# Patient Record
Sex: Male | Born: 1980 | Race: White | Hispanic: No | Marital: Single | Smoking: Never smoker
Health system: Southern US, Community
[De-identification: ages and names within clinical notes are randomized; demographics above are authoritative.]

## PROBLEM LIST (undated history)

## (undated) VITALS — BP 127/88 | HR 111 | Temp 98.2°F | Resp 18 | Ht 72.5 in | Wt 175.0 lb

## (undated) DIAGNOSIS — R634 Abnormal weight loss: Secondary | ICD-10-CM

## (undated) DIAGNOSIS — F329 Major depressive disorder, single episode, unspecified: Secondary | ICD-10-CM

## (undated) DIAGNOSIS — K589 Irritable bowel syndrome without diarrhea: Secondary | ICD-10-CM

## (undated) DIAGNOSIS — G4700 Insomnia, unspecified: Secondary | ICD-10-CM

## (undated) DIAGNOSIS — F419 Anxiety disorder, unspecified: Secondary | ICD-10-CM

## (undated) DIAGNOSIS — F319 Bipolar disorder, unspecified: Secondary | ICD-10-CM

## (undated) DIAGNOSIS — G9340 Encephalopathy, unspecified: Secondary | ICD-10-CM

## (undated) DIAGNOSIS — F909 Attention-deficit hyperactivity disorder, unspecified type: Secondary | ICD-10-CM

## (undated) DIAGNOSIS — F32A Depression, unspecified: Secondary | ICD-10-CM

## (undated) HISTORY — DX: Depression, unspecified: F32.A

## (undated) HISTORY — DX: Major depressive disorder, single episode, unspecified: F32.9

## (undated) HISTORY — PX: ANAL FISSURECTOMY: SUR608

## (undated) HISTORY — DX: Abnormal weight loss: R63.4

## (undated) HISTORY — DX: Bipolar disorder, unspecified: F31.9

## (undated) HISTORY — DX: Insomnia, unspecified: G47.00

## (undated) HISTORY — DX: Encephalopathy, unspecified: G93.40

## (undated) HISTORY — PX: COLONOSCOPY W/ BIOPSIES: SHX1374

## (undated) HISTORY — PX: COLON SURGERY: SHX602

## (undated) HISTORY — DX: Anxiety disorder, unspecified: F41.9

---

## 2001-04-26 ENCOUNTER — Encounter (INDEPENDENT_AMBULATORY_CARE_PROVIDER_SITE_OTHER): Payer: Self-pay | Admitting: Specialist

## 2001-04-26 ENCOUNTER — Ambulatory Visit (HOSPITAL_COMMUNITY): Admission: RE | Admit: 2001-04-26 | Discharge: 2001-04-26 | Payer: Self-pay | Admitting: Gastroenterology

## 2001-04-26 DIAGNOSIS — K5190 Ulcerative colitis, unspecified, without complications: Secondary | ICD-10-CM

## 2001-10-24 ENCOUNTER — Ambulatory Visit (HOSPITAL_COMMUNITY): Admission: RE | Admit: 2001-10-24 | Discharge: 2001-10-24 | Payer: Self-pay | Admitting: Internal Medicine

## 2001-10-25 ENCOUNTER — Encounter: Payer: Self-pay | Admitting: Internal Medicine

## 2004-07-08 ENCOUNTER — Ambulatory Visit: Payer: Self-pay | Admitting: Gastroenterology

## 2005-03-16 ENCOUNTER — Ambulatory Visit: Payer: Self-pay | Admitting: Gastroenterology

## 2006-01-28 ENCOUNTER — Ambulatory Visit: Payer: Self-pay | Admitting: Gastroenterology

## 2006-09-14 ENCOUNTER — Ambulatory Visit: Payer: Self-pay | Admitting: Gastroenterology

## 2006-09-14 LAB — CONVERTED CEMR LAB
ALT: 23 units/L (ref 0–40)
AST: 25 units/L (ref 0–37)
Albumin: 4.2 g/dL (ref 3.5–5.2)
Alkaline Phosphatase: 44 units/L (ref 39–117)
Basophils Absolute: 0 10*3/uL (ref 0.0–0.1)
Basophils Relative: 0.8 % (ref 0.0–1.0)
Bilirubin, Direct: 0.1 mg/dL (ref 0.0–0.3)
Eosinophils Absolute: 0.1 10*3/uL (ref 0.0–0.6)
Eosinophils Relative: 2.4 % (ref 0.0–5.0)
HCT: 41.7 % (ref 39.0–52.0)
Hemoglobin: 14.5 g/dL (ref 13.0–17.0)
Lymphocytes Relative: 31.1 % (ref 12.0–46.0)
MCHC: 34.7 g/dL (ref 30.0–36.0)
MCV: 96.2 fL (ref 78.0–100.0)
Monocytes Absolute: 0.3 10*3/uL (ref 0.2–0.7)
Monocytes Relative: 7.5 % (ref 3.0–11.0)
Neutro Abs: 2 10*3/uL (ref 1.4–7.7)
Neutrophils Relative %: 58.2 % (ref 43.0–77.0)
Platelets: 299 10*3/uL (ref 150–400)
RBC: 4.34 M/uL (ref 4.22–5.81)
RDW: 12.5 % (ref 11.5–14.6)
Total Bilirubin: 0.7 mg/dL (ref 0.3–1.2)
Total Protein: 7.3 g/dL (ref 6.0–8.3)
WBC: 3.5 10*3/uL — ABNORMAL LOW (ref 4.5–10.5)

## 2006-09-28 ENCOUNTER — Ambulatory Visit: Payer: Self-pay | Admitting: Internal Medicine

## 2006-09-28 LAB — CONVERTED CEMR LAB: TSH: 0.97 microintl units/mL (ref 0.35–5.50)

## 2006-12-27 ENCOUNTER — Ambulatory Visit: Payer: Self-pay | Admitting: Gastroenterology

## 2007-01-02 ENCOUNTER — Ambulatory Visit: Payer: Self-pay | Admitting: Gastroenterology

## 2007-02-10 ENCOUNTER — Ambulatory Visit: Payer: Self-pay | Admitting: Gastroenterology

## 2007-02-20 ENCOUNTER — Ambulatory Visit: Payer: Self-pay | Admitting: Gastroenterology

## 2007-02-20 ENCOUNTER — Encounter: Payer: Self-pay | Admitting: Gastroenterology

## 2007-02-27 ENCOUNTER — Ambulatory Visit: Payer: Self-pay | Admitting: Internal Medicine

## 2007-03-02 ENCOUNTER — Inpatient Hospital Stay (HOSPITAL_COMMUNITY): Admission: AD | Admit: 2007-03-02 | Discharge: 2007-03-06 | Payer: Self-pay | Admitting: Internal Medicine

## 2007-03-02 ENCOUNTER — Ambulatory Visit: Payer: Self-pay | Admitting: Internal Medicine

## 2007-03-04 ENCOUNTER — Ambulatory Visit: Payer: Self-pay | Admitting: Internal Medicine

## 2007-03-10 ENCOUNTER — Ambulatory Visit: Payer: Self-pay | Admitting: Internal Medicine

## 2007-03-10 ENCOUNTER — Ambulatory Visit: Payer: Self-pay | Admitting: Gastroenterology

## 2007-03-14 ENCOUNTER — Ambulatory Visit: Payer: Self-pay | Admitting: Gastroenterology

## 2007-04-25 ENCOUNTER — Ambulatory Visit: Payer: Self-pay | Admitting: Gastroenterology

## 2007-04-25 LAB — CONVERTED CEMR LAB
Cholesterol: 227 mg/dL (ref 0–200)
HDL: 43.1 mg/dL (ref >39.0)
Total CHOL/HDL Ratio: 5.3
VLDL: 9 mg/dL (ref 0–40)

## 2007-05-23 ENCOUNTER — Ambulatory Visit: Payer: Self-pay | Admitting: Internal Medicine

## 2007-11-27 DIAGNOSIS — F329 Major depressive disorder, single episode, unspecified: Secondary | ICD-10-CM

## 2007-11-27 DIAGNOSIS — F411 Generalized anxiety disorder: Secondary | ICD-10-CM

## 2007-12-15 ENCOUNTER — Telehealth: Payer: Self-pay | Admitting: Gastroenterology

## 2007-12-20 ENCOUNTER — Ambulatory Visit: Payer: Self-pay | Admitting: Gastroenterology

## 2007-12-20 LAB — CONVERTED CEMR LAB
Basophils Absolute: 0 10*3/uL (ref 0.0–0.1)
Basophils Relative: 0.7 % (ref 0.0–1.0)
Eosinophils Absolute: 0.1 10*3/uL (ref 0.0–0.7)
Eosinophils Relative: 2.3 % (ref 0.0–5.0)
HCT: 43.5 % (ref 39.0–52.0)
MCHC: 33.8 g/dL (ref 30.0–36.0)
MCV: 98.2 fL (ref 78.0–100.0)
Monocytes Absolute: 0.3 10*3/uL (ref 0.1–1.0)
Neutrophils Relative %: 46.4 % (ref 43.0–77.0)
Platelets: 267 10*3/uL (ref 150–400)
RBC: 4.43 M/uL (ref 4.22–5.81)
WBC: 4 10*3/uL — ABNORMAL LOW (ref 4.5–10.5)

## 2007-12-22 ENCOUNTER — Telehealth: Payer: Self-pay | Admitting: Gastroenterology

## 2007-12-28 ENCOUNTER — Ambulatory Visit: Payer: Self-pay | Admitting: Internal Medicine

## 2007-12-28 ENCOUNTER — Encounter: Payer: Self-pay | Admitting: Gastroenterology

## 2008-02-12 ENCOUNTER — Telehealth: Payer: Self-pay | Admitting: Gastroenterology

## 2008-03-05 ENCOUNTER — Ambulatory Visit: Payer: Self-pay | Admitting: Gastroenterology

## 2008-03-05 LAB — CONVERTED CEMR LAB
Hgb A1c MFr Bld: 5.5 % (ref 4.6–6.0)
Total CHOL/HDL Ratio: 4.8

## 2008-03-18 ENCOUNTER — Telehealth: Payer: Self-pay | Admitting: Gastroenterology

## 2008-05-13 ENCOUNTER — Telehealth: Payer: Self-pay | Admitting: Gastroenterology

## 2008-06-11 ENCOUNTER — Telehealth: Payer: Self-pay | Admitting: Gastroenterology

## 2008-06-14 ENCOUNTER — Ambulatory Visit: Payer: Self-pay | Admitting: Internal Medicine

## 2008-06-14 DIAGNOSIS — E7800 Pure hypercholesterolemia, unspecified: Secondary | ICD-10-CM | POA: Insufficient documentation

## 2008-06-14 LAB — CONVERTED CEMR LAB
ALT: 15 units/L (ref 0–53)
AST: 17 units/L (ref 0–37)
Albumin: 4.9 g/dL (ref 3.5–5.2)
BUN: 12 mg/dL (ref 6–23)
Basophils Absolute: 0 10*3/uL (ref 0.0–0.1)
Basophils Relative: 0.3 % (ref 0.0–3.0)
CO2: 32 meq/L (ref 19–32)
Calcium: 9.2 mg/dL (ref 8.4–10.5)
Chlamydia, Swab/Urine, PCR: NEGATIVE
Chloride: 104 meq/L (ref 96–112)
Cholesterol: 224 mg/dL (ref 0–200)
Creatinine, Ser: 0.9 mg/dL (ref 0.4–1.5)
Direct LDL: 148.8 mg/dL
Eosinophils Absolute: 0 10*3/uL (ref 0.0–0.7)
GFR calc non Af Amer: 107 mL/min
HCT: 40.3 % (ref 39.0–52.0)
HDL goal, serum: 40 mg/dL
Hemoglobin, Urine: NEGATIVE
Hemoglobin: 14.1 g/dL (ref 13.0–17.0)
LDL Goal: 160 mg/dL
Leukocytes, UA: NEGATIVE
MCHC: 35.1 g/dL (ref 30.0–36.0)
MCV: 100.5 fL — ABNORMAL HIGH (ref 78.0–100.0)
Monocytes Absolute: 0.2 10*3/uL (ref 0.1–1.0)
Neutro Abs: 1.5 10*3/uL (ref 1.4–7.7)
RBC: 4.01 M/uL — ABNORMAL LOW (ref 4.22–5.81)
RDW: 12.9 % (ref 11.5–14.6)
pH: 7.5 (ref 5.0–8.0)

## 2008-06-16 ENCOUNTER — Encounter: Payer: Self-pay | Admitting: Internal Medicine

## 2008-06-17 ENCOUNTER — Encounter: Payer: Self-pay | Admitting: Internal Medicine

## 2008-07-18 ENCOUNTER — Ambulatory Visit: Payer: Self-pay | Admitting: Gastroenterology

## 2008-08-06 ENCOUNTER — Telehealth: Payer: Self-pay | Admitting: Gastroenterology

## 2008-10-17 ENCOUNTER — Telehealth: Payer: Self-pay | Admitting: Gastroenterology

## 2008-11-08 ENCOUNTER — Encounter: Payer: Self-pay | Admitting: Gastroenterology

## 2008-11-08 ENCOUNTER — Telehealth: Payer: Self-pay | Admitting: Gastroenterology

## 2009-01-14 ENCOUNTER — Telehealth: Payer: Self-pay | Admitting: Gastroenterology

## 2009-01-17 ENCOUNTER — Ambulatory Visit: Payer: Self-pay | Admitting: Gastroenterology

## 2009-01-17 LAB — CONVERTED CEMR LAB
ALT: 14 U/L
AST: 21 U/L
Albumin: 4.5 g/dL
Alkaline Phosphatase: 37 U/L — ABNORMAL LOW
Basophils Absolute: 0 K/uL
Basophils Relative: 0.9 %
Bilirubin, Direct: 0.2 mg/dL
Eosinophils Absolute: 0 K/uL
Eosinophils Relative: 1.4 %
HCT: 37.5 % — ABNORMAL LOW
Hemoglobin: 12.8 g/dL — ABNORMAL LOW
Lymphocytes Relative: 41.1 %
Lymphs Abs: 1.1 K/uL
MCHC: 34.2 g/dL
MCV: 104.2 fL — ABNORMAL HIGH
Monocytes Absolute: 0.2 K/uL
Monocytes Relative: 9 %
Neutro Abs: 1.3 K/uL — ABNORMAL LOW
Neutrophils Relative %: 47.6 %
Platelets: 200 K/uL
RBC: 3.6 M/uL — ABNORMAL LOW
RDW: 12.8 %
Total Bilirubin: 1 mg/dL
Total Protein: 6.9 g/dL
WBC: 2.6 10*3/microliter — ABNORMAL LOW

## 2009-02-20 ENCOUNTER — Ambulatory Visit: Payer: Self-pay | Admitting: Internal Medicine

## 2009-02-21 ENCOUNTER — Encounter: Payer: Self-pay | Admitting: Internal Medicine

## 2009-03-06 ENCOUNTER — Ambulatory Visit: Payer: Self-pay | Admitting: Gastroenterology

## 2009-03-06 LAB — CONVERTED CEMR LAB
LDL Cholesterol: 142 mg/dL — ABNORMAL HIGH (ref 0–99)
Total CHOL/HDL Ratio: 5
Triglycerides: 44 mg/dL (ref 0.0–149.0)

## 2009-03-12 ENCOUNTER — Ambulatory Visit: Payer: Self-pay | Admitting: Gastroenterology

## 2009-04-04 ENCOUNTER — Telehealth: Payer: Self-pay | Admitting: Gastroenterology

## 2009-04-09 ENCOUNTER — Telehealth: Payer: Self-pay | Admitting: Gastroenterology

## 2009-05-01 ENCOUNTER — Telehealth: Payer: Self-pay | Admitting: Gastroenterology

## 2009-06-17 ENCOUNTER — Telehealth: Payer: Self-pay | Admitting: Gastroenterology

## 2009-07-16 ENCOUNTER — Telehealth: Payer: Self-pay | Admitting: Gastroenterology

## 2009-07-16 ENCOUNTER — Ambulatory Visit: Payer: Self-pay | Admitting: Gastroenterology

## 2009-07-16 LAB — CONVERTED CEMR LAB
Alkaline Phosphatase: 35 units/L — ABNORMAL LOW (ref 39–117)
Basophils Absolute: 0 10*3/uL (ref 0.0–0.1)
Basophils Relative: 0.2 % (ref 0.0–3.0)
Bilirubin, Direct: 0.1 mg/dL (ref 0.0–0.3)
Eosinophils Absolute: 0 10*3/uL (ref 0.0–0.7)
Lymphocytes Relative: 33.6 % (ref 12.0–46.0)
MCHC: 34.1 g/dL (ref 30.0–36.0)
Neutrophils Relative %: 60.3 % (ref 43.0–77.0)
RBC: 3.76 M/uL — ABNORMAL LOW (ref 4.22–5.81)
Total Bilirubin: 0.8 mg/dL (ref 0.3–1.2)
Total Protein: 7.1 g/dL (ref 6.0–8.3)
WBC: 3.3 10*3/uL — ABNORMAL LOW (ref 4.5–10.5)

## 2009-07-29 ENCOUNTER — Telehealth: Payer: Self-pay | Admitting: Gastroenterology

## 2009-08-12 ENCOUNTER — Telehealth: Payer: Self-pay | Admitting: Gastroenterology

## 2009-08-13 ENCOUNTER — Ambulatory Visit: Payer: Self-pay | Admitting: Gastroenterology

## 2009-08-18 ENCOUNTER — Telehealth: Payer: Self-pay | Admitting: Gastroenterology

## 2009-08-27 ENCOUNTER — Telehealth: Payer: Self-pay | Admitting: Gastroenterology

## 2009-09-02 ENCOUNTER — Telehealth: Payer: Self-pay | Admitting: Gastroenterology

## 2009-09-05 ENCOUNTER — Telehealth: Payer: Self-pay | Admitting: Gastroenterology

## 2009-09-10 ENCOUNTER — Ambulatory Visit: Payer: Self-pay | Admitting: Gastroenterology

## 2009-09-11 ENCOUNTER — Telehealth: Payer: Self-pay | Admitting: Gastroenterology

## 2009-09-11 LAB — CONVERTED CEMR LAB
Albumin: 4.9 g/dL (ref 3.5–5.2)
Basophils Absolute: 0 10*3/uL (ref 0.0–0.1)
Eosinophils Absolute: 0 10*3/uL (ref 0.0–0.7)
HCT: 44.4 % (ref 39.0–52.0)
Hemoglobin: 14.4 g/dL (ref 13.0–17.0)
Lymphs Abs: 0.5 10*3/uL — ABNORMAL LOW (ref 0.7–4.0)
MCHC: 32.5 g/dL (ref 30.0–36.0)
MCV: 106.9 fL — ABNORMAL HIGH (ref 78.0–100.0)
Monocytes Absolute: 0.1 10*3/uL (ref 0.1–1.0)
Neutro Abs: 2.1 10*3/uL (ref 1.4–7.7)
RDW: 13.1 % (ref 11.5–14.6)
Total Protein: 7.9 g/dL (ref 6.0–8.3)

## 2009-09-17 ENCOUNTER — Ambulatory Visit: Payer: Self-pay | Admitting: Gastroenterology

## 2009-09-17 LAB — CONVERTED CEMR LAB
Basophils Relative: 0.6 % (ref 0.0–3.0)
Eosinophils Relative: 1.3 % (ref 0.0–5.0)
HCT: 38.3 % — ABNORMAL LOW (ref 39.0–52.0)
Lymphs Abs: 1.9 10*3/uL (ref 0.7–4.0)
MCV: 105.7 fL — ABNORMAL HIGH (ref 78.0–100.0)
Monocytes Relative: 7.6 % (ref 3.0–12.0)
Neutrophils Relative %: 41.8 % — ABNORMAL LOW (ref 43.0–77.0)
Platelets: 189 10*3/uL (ref 150.0–400.0)
RBC: 3.63 M/uL — ABNORMAL LOW (ref 4.22–5.81)
WBC: 3.7 10*3/uL — ABNORMAL LOW (ref 4.5–10.5)

## 2009-09-29 ENCOUNTER — Ambulatory Visit: Payer: Self-pay | Admitting: Gastroenterology

## 2009-09-29 LAB — CONVERTED CEMR LAB
Cholesterol: 224 mg/dL — ABNORMAL HIGH (ref 0–200)
Glucose, Bld: 93 mg/dL (ref 70–99)
Total CHOL/HDL Ratio: 4
Triglycerides: 61 mg/dL (ref 0.0–149.0)
VLDL: 12.2 mg/dL (ref 0.0–40.0)

## 2009-10-13 ENCOUNTER — Ambulatory Visit: Payer: Self-pay | Admitting: Gastroenterology

## 2009-12-12 ENCOUNTER — Telehealth: Payer: Self-pay | Admitting: Gastroenterology

## 2009-12-31 ENCOUNTER — Telehealth: Payer: Self-pay | Admitting: Gastroenterology

## 2010-01-01 ENCOUNTER — Ambulatory Visit: Payer: Self-pay | Admitting: Gastroenterology

## 2010-01-01 LAB — CONVERTED CEMR LAB
Basophils Absolute: 0 10*3/uL (ref 0.0–0.1)
Bilirubin, Direct: 0.1 mg/dL (ref 0.0–0.3)
Eosinophils Absolute: 0 10*3/uL (ref 0.0–0.7)
Lymphocytes Relative: 26.4 % (ref 12.0–46.0)
MCHC: 34 g/dL (ref 30.0–36.0)
MCV: 102.6 fL — ABNORMAL HIGH (ref 78.0–100.0)
Monocytes Absolute: 0.3 10*3/uL (ref 0.1–1.0)
Neutrophils Relative %: 65 % (ref 43.0–77.0)
Platelets: 256 10*3/uL (ref 150.0–400.0)
Total Bilirubin: 0.8 mg/dL (ref 0.3–1.2)
Total Protein: 6.9 g/dL (ref 6.0–8.3)

## 2010-01-20 ENCOUNTER — Encounter (INDEPENDENT_AMBULATORY_CARE_PROVIDER_SITE_OTHER): Payer: Self-pay | Admitting: *Deleted

## 2010-03-17 ENCOUNTER — Encounter (INDEPENDENT_AMBULATORY_CARE_PROVIDER_SITE_OTHER): Payer: Self-pay | Admitting: *Deleted

## 2010-03-20 ENCOUNTER — Ambulatory Visit: Payer: Self-pay | Admitting: Gastroenterology

## 2010-04-02 ENCOUNTER — Ambulatory Visit: Payer: Self-pay | Admitting: Gastroenterology

## 2010-04-02 ENCOUNTER — Ambulatory Visit (HOSPITAL_COMMUNITY): Admission: RE | Admit: 2010-04-02 | Discharge: 2010-04-02 | Payer: Self-pay | Admitting: Gastroenterology

## 2010-04-03 ENCOUNTER — Encounter: Payer: Self-pay | Admitting: Gastroenterology

## 2010-05-06 ENCOUNTER — Ambulatory Visit: Payer: Self-pay | Admitting: Internal Medicine

## 2010-05-06 DIAGNOSIS — G4700 Insomnia, unspecified: Secondary | ICD-10-CM | POA: Insufficient documentation

## 2010-05-08 ENCOUNTER — Ambulatory Visit: Payer: Self-pay | Admitting: Internal Medicine

## 2010-05-11 ENCOUNTER — Telehealth: Payer: Self-pay | Admitting: Internal Medicine

## 2010-05-14 ENCOUNTER — Telehealth: Payer: Self-pay | Admitting: Internal Medicine

## 2010-05-18 ENCOUNTER — Telehealth (INDEPENDENT_AMBULATORY_CARE_PROVIDER_SITE_OTHER): Payer: Self-pay | Admitting: *Deleted

## 2010-06-05 ENCOUNTER — Telehealth: Payer: Self-pay | Admitting: Internal Medicine

## 2010-06-24 ENCOUNTER — Telehealth: Payer: Self-pay | Admitting: Gastroenterology

## 2010-06-25 ENCOUNTER — Telehealth (INDEPENDENT_AMBULATORY_CARE_PROVIDER_SITE_OTHER): Payer: Self-pay | Admitting: *Deleted

## 2010-07-24 ENCOUNTER — Ambulatory Visit: Payer: Self-pay | Admitting: Internal Medicine

## 2010-08-18 ENCOUNTER — Ambulatory Visit
Admission: RE | Admit: 2010-08-18 | Discharge: 2010-08-18 | Payer: Self-pay | Source: Home / Self Care | Attending: Gastroenterology | Admitting: Gastroenterology

## 2010-08-18 ENCOUNTER — Ambulatory Visit
Admission: RE | Admit: 2010-08-18 | Discharge: 2010-08-18 | Payer: Self-pay | Source: Home / Self Care | Attending: Internal Medicine | Admitting: Internal Medicine

## 2010-08-18 LAB — CONVERTED CEMR LAB
HDL: 59.4 mg/dL (ref >39.00)
LDL Cholesterol: 109 mg/dL — ABNORMAL HIGH (ref 0–99)
VLDL: 11.8 mg/dL (ref 0.0–40.0)

## 2010-08-19 ENCOUNTER — Encounter: Payer: Self-pay | Admitting: Internal Medicine

## 2010-08-19 LAB — CONVERTED CEMR LAB
AST: 16 units/L (ref 0–37)
Albumin: 4.3 g/dL (ref 3.5–5.2)
Alkaline Phosphatase: 41 units/L (ref 39–117)
Basophils Absolute: 0 10*3/uL (ref 0.0–0.1)
Eosinophils Absolute: 0 10*3/uL (ref 0.0–0.7)
Lymphocytes Relative: 39.9 % (ref 12.0–46.0)
MCHC: 34.2 g/dL (ref 30.0–36.0)
Monocytes Relative: 6.6 % (ref 3.0–12.0)
Neutrophils Relative %: 51.8 % (ref 43.0–77.0)
Platelets: 246 10*3/uL (ref 150.0–400.0)
RDW: 15.6 % — ABNORMAL HIGH (ref 11.5–14.6)
Total Bilirubin: 1.1 mg/dL (ref 0.3–1.2)

## 2010-09-01 ENCOUNTER — Telehealth: Payer: Self-pay | Admitting: Gastroenterology

## 2010-09-15 NOTE — Procedures (Signed)
Summary: Colonoscopy  Patient: Rachel Samples Note: All result statuses are Final unless otherwise noted.  Tests: (1) Colonoscopy (COL)   COL Colonoscopy           DONE     [facility]     [address] [place], [place]  [address]           COLONOSCOPY PROCEDURE REPORT           PATIENT:  Benz, Vandenberghe  MR#:  [id number]     BIRTHDATE:  [birth year], 28 yrs. old  GENDER:  male     ENDOSCOPIST:  Barbette Hair. Arlyce Dice, MD     REF. BY:     PROCEDURE DATE:  04/02/2010     PROCEDURE:  Colonoscopy with biopsy     ASA CLASS:  Class II     INDICATIONS:  evaluation of ulcerative colitis Surveillance     MEDICATIONS:   MAC sedation, administered by CRNA           DESCRIPTION OF PROCEDURE:   After the risks benefits and     alternatives of the procedure were thoroughly explained, informed     consent was obtained.  Digital rectal exam was performed and     revealed no abnormalities.   The [id number] ([id number]) endoscope was     introduced through the anus and advanced to the cecum, which was     identified by the ileocecal valve, without limitations.  The     quality of the prep was good, using MoviPrep.  The instrument was     then slowly withdrawn as the colon was fully examined.     <<PROCEDUREIMAGES>>           FINDINGS:  A normal appearing cecum, ileocecal valve, and     appendiceal orifice were identified. The ascending, hepatic     flexure, transverse, splenic flexure, descending, sigmoid colon,     and rectum appeared unremarkable. Questionable minimal erythema     rectosigmoid (see image001, image002, image003, image004,     image005, and image006). Random biopsies were taken every 10cm to     r/o dysplasia   Retroflexed views in the rectum revealed no     abnormalities.    The scope was then withdrawn from the patient     and the procedure completed.           COMPLICATIONS:  None     ENDOSCOPIC IMPRESSION:     1) Ulcerative colitis - in remission  RECOMMENDATIONS:     1) Colonoscopy 1 year     2) call office next 1-3 days to schedule followup visit in 3     months     3) continue current medications     REPEAT EXAM:  In 1 year(s) for Colonoscopy.           ______________________________     Barbette Hair. Arlyce Dice, MD           CC:  Etta Grandchild, MD           n.     Rosalie Doctor:   Barbette Hair. Kaplan at 04/02/2010 03:49 PM           Mervin Kung, [phone]  Note: An exclamation mark (!) indicates a result that was not dispersed into the flowsheet. Document Creation Date: 04/02/2010 3:51 PM _______________________________________________________________________  (1) Order result status: Final Collection or observation date-time: 04/02/2010 15:41 Requested date-time:  Receipt date-time:  Reported  date-time:  Referring Physician:   Ordering Physician: Melvia Heaps [phone]) Specimen Source:  Source: Launa Grill Order Number: [id number] Lab site:

## 2010-09-15 NOTE — Assessment & Plan Note (Signed)
Summary: F/U APPT/INSOMNIA/#/CD   Vital Signs:  Patient profile:   30 year old male Height:      74 inches Weight:      187.25 pounds BMI:     24.13 O2 Sat:      96 % on Room air Temp:     97.1 degrees F oral Pulse rate:   84 / minute Pulse rhythm:   regular BP sitting:   112 / 68  (left arm) Cuff size:   large  Vitals Entered By: Rock Nephew CMA (May 06, 2010 11:25 AM)  O2 Flow:  Room air  Primary Care Provider:  Etta Grandchild, MD  CC:  Insomnia.  History of Present Illness:  Insomnia      This is a 30 year old man who presents with Insomnia.  The symptoms began 4-8 weeks ago.  The severity is described as mild.  The patient reports difficulty falling asleep and daytime somnolence, but denies frequent awakening, early awakening, nightmares, leg movements, snoring, and apnea noted by partner.  Associated symptoms include weight gain.  Insomnia has led to problems with falling asleep at work.  Treatments tried in the past and found to be ineffective incude behavior modification and relaxation techniques.    Depression History:      The patient denies a depressed mood most of the day and a diminished interest in his usual daily activities.  Positive alarm features for depression include insomnia.  However, he denies significant weight loss, significant weight gain, hypersomnia, psychomotor agitation, psychomotor retardation, fatigue (loss of energy), feelings of worthlessness (guilt), impaired concentration (indecisiveness), and recurrent thoughts of death or suicide.  The patient denies symptoms of a manic disorder including persistently & abnormally elevated mood, abnormally & persistently irritable mood, less need for sleep, talkative or feels need to keep talking, distractibility, flight of ideas, increase in goal-directed activity, psychomotor agitation, inflated self-esteem or grandiosity, excessive buying sprees, excessive sexual indiscretions, and excessive foolish  business investments.        Risk factors for depression include a personal history of depression.  The patient denies that he feels like life is not worth living, denies that he wishes that he were dead, and denies that he has thought about ending his life.         Preventive Screening-Counseling & Management  Alcohol-Tobacco     Alcohol drinks/day: 0     Smoking Status: never     Passive Smoke Exposure: no     Tobacco Counseling: not indicated; no tobacco use      Sexual History:  currently monogamous.        Drug Use:  never.        Blood Transfusions:  no.    Current Medications (verified): 1)  Alprazolam 0.5 Mg Tabs (Alprazolam) .Marland Kitchen.. 1 By Mouth Three Times A Day As Needed 2)  Imuran 50 Mg  Tabs (Azathioprine) .... 2  Tabs Once Daily 3)  Azulfidine 500 Mg  Tabs (Sulfasalazine) .... 4  Tabs B.i.d. 4)  Geodon 80 Mg Caps (Ziprasidone Hcl) .... Take 1 Tablet By Mouth Once A Day 5)  Zolpidem Tartrate 10 Mg Tabs (Zolpidem Tartrate) .... One By Mouth Qhs As Needed For Insomnia  Allergies (verified): 1)  Keflex (Cephalexin)  Past History:  Past Medical History: Current Problems:  ANXIETY (ICD-300.00) DEPRESSION (ICD-311)- Dr. Tomasa Rand COLITIS, ULCERATIVE (ICD-556.9)  Past Surgical History: Reviewed history from 08/13/2009 and no changes required. Anal fissure  Family History: Reviewed history from 03/12/2009 and  no changes required. lymphoma: Grandfather No FH of Colon Cancer: Family History of Colitis:aunt  Social History: Reviewed history from 03/05/2008 and no changes required. 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  Alcohol Use - no Daily Caffeine Use- 2 Illicit Drug Use - no Patient gets regular exercise.  Review of Systems  The patient denies anorexia, fever, weight loss, weight gain, chest pain, syncope, peripheral edema, prolonged cough, abdominal pain, and depression.   GI:  Denies abdominal pain, bloody stools, change in bowel  habits, diarrhea, loss of appetite, nausea, vomiting, vomiting blood, and yellowish skin color.  Physical Exam  General:  alert, well-developed, well-nourished, well-hydrated, appropriate dress, normal appearance, healthy-appearing, cooperative to examination, and good hygiene.   Head:  normocephalic and atraumatic.   Eyes:  no icterus Mouth:  Oral mucosa and oropharynx without lesions or exudates.  Teeth in good repair. Neck:  supple, full ROM, no masses, and no HJR.   Lungs:  Normal respiratory effort, chest expands symmetrically. Lungs are clear to auscultation, no crackles or wheezes. Heart:  Normal rate and regular rhythm. S1 and S2 normal without gallop, murmur, click, rub or other extra sounds. Abdomen:  Bowel sounds positive,abdomen soft and non-tender without masses, organomegaly or hernias noted. Msk:  No deformity or scoliosis noted of thoracic or lumbar spine.   Pulses:  R and L carotid,radial,femoral,dorsalis pedis and posterior tibial pulses are full and equal bilaterally Extremities:  No clubbing, cyanosis, edema, or deformity noted with normal full range of motion of all joints.   Neurologic:  No cranial nerve deficits noted. Station and gait are normal. Plantar reflexes are down-going bilaterally. DTRs are symmetrical throughout. Sensory, motor and coordinative functions appear intact. Skin:  Intact without suspicious lesions or rashes Cervical Nodes:  no anterior cervical adenopathy and no posterior cervical adenopathy.   Psych:  Oriented X3, memory intact for recent and remote, normally interactive, good eye contact, not anxious appearing, not depressed appearing, not agitated, not suicidal, and not homicidal.     Impression & Recommendations:  Problem # 1:  INSOMNIA-SLEEP DISORDER-UNSPEC (ICD-780.52) Assessment New  The following medications were removed from the medication list:    Lunesta 2 Mg Tabs (Eszopiclone) ..... One by mouth at bedtime as needed for  insomnia His updated medication list for this problem includes:    Zolpidem Tartrate 10 Mg Tabs (Zolpidem tartrate) ..... One by mouth qhs as needed for insomnia  Discussed sleep hygiene.   Problem # 2:  COLITIS, ULCERATIVE (ICD-556.9) Assessment: Improved  Complete Medication List: 1)  Alprazolam 0.5 Mg Tabs (Alprazolam) .Marland Kitchen.. 1 by mouth three times a day as needed 2)  Imuran 50 Mg Tabs (Azathioprine) .... 2  tabs once daily 3)  Azulfidine 500 Mg Tabs (Sulfasalazine) .... 4  tabs b.i.d. 4)  Geodon 80 Mg Caps (Ziprasidone hcl) .... Take 1 tablet by mouth once a day 5)  Zolpidem Tartrate 10 Mg Tabs (Zolpidem tartrate) .... One by mouth qhs as needed for insomnia  Patient Instructions: 1)  Please schedule a follow-up appointment in 2 months. 2)  It is important that you exercise regularly at least 20 minutes 5 times a week. If you develop chest pain, have severe difficulty breathing, or feel very tired , stop exercising immediately and seek medical attention. Prescriptions: ZOLPIDEM TARTRATE 10 MG TABS (ZOLPIDEM TARTRATE) One by mouth qHS as needed for insomnia  #30 x 2   Entered and Authorized by:   Etta Grandchild MD   Signed by:  Etta Grandchild MD on 05/06/2010   Method used:   Print then Give to Patient   RxID:   [id number] LUNESTA 2 MG TABS (ESZOPICLONE) One by mouth at bedtime as needed for insomnia  #30 x 2   Entered and Authorized by:   Etta Grandchild MD   Signed by:   Etta Grandchild MD on 05/06/2010   Method used:   Print then Give to Patient   RxID:   [id number]

## 2010-09-15 NOTE — Assessment & Plan Note (Signed)
Summary: procedure f.u...em   History of Present Illness Visit Type: Follow-up Visit Primary GI MD: Melvia Heaps MD [facility] Primary Provider: Etta Grandchild, MD Requesting Provider: na Chief Complaint: Procedure follow up, No GI complaints History of Present Illness:   [name] has returned for followup of his ulcerative colitis.  Sigmoidoscopy there was no evidence for active colitis.  He has been tapered his prednisone is currently taking 10 mg a day.  Imuran was lowered to 100 mg daily because of leukopenia.  His followup white count has stabilized.  He remains on Azulfidine 200 mg b.i.d.   GI Review of Systems      Denies abdominal pain, acid reflux, belching, bloating, chest pain, dysphagia with liquids, dysphagia with solids, heartburn, loss of appetite, nausea, vomiting, vomiting blood, weight loss, and  weight gain.        Denies anal fissure, black tarry stools, change in bowel habit, constipation, diarrhea, diverticulosis, fecal incontinence, heme positive stool, hemorrhoids, irritable bowel syndrome, jaundice, light color stool, liver problems, rectal bleeding, and  rectal pain.    Current Medications (verified): 1)  Alprazolam 0.5 Mg Tabs (Alprazolam) .Marland Kitchen.. 1 By Mouth Three Times A Day As Needed 2)  Imuran 50 Mg  Tabs (Azathioprine) .... 2  Tabs Once Daily 3)  Azulfidine 500 Mg  Tabs (Sulfasalazine) .... 4  Tabs B.i.d. 4)  Geodon 80 Mg Caps (Ziprasidone Hcl) .... Take 1 Tablet By Mouth Once A Day 5)  Prednisone 10 Mg  Tabs (Prednisone) .Marland Kitchen.. 1 By Mouth Once Daily 6)  Dicyclomine Hcl 20 Mg Tabs (Dicyclomine Hcl) .... Take One Daily  Allergies (verified): 1)  Keflex (Cephalexin)  Past History:  Past Medical History: Last updated: 11/27/2007 Current Problems:  ANXIETY (ICD-300.00) DEPRESSION (ICD-311) COLITIS, ULCERATIVE (ICD-556.9)  Past Surgical History: Last updated: 08/13/2009 Anal fissure  Family History: Last updated: 03/12/2009 lymphoma: Grandfather No FH of  Colon Cancer: Family History of Colitis:aunt  Social History: Last updated: 03/05/2008 Occupation: Unemployed Patient has never smoked.  Alcohol Use - no Daily Caffeine Use- 2 Illicit Drug Use - no Patient gets regular exercise.  Review of Systems  The patient denies allergy/sinus, anemia, anxiety-new, arthritis/joint pain, back pain, blood in urine, breast changes/lumps, change in vision, confusion, cough, coughing up blood, depression-new, fainting, fatigue, fever, headaches-new, hearing problems, heart murmur, heart rhythm changes, itching, muscle pains/cramps, night sweats, nosebleeds, shortness of breath, skin rash, sleeping problems, sore throat, swelling of feet/legs, swollen lymph glands, thirst - excessive, urination - excessive, urination changes/pain, urine leakage, vision changes, and voice change.    Vital Signs:  Patient profile:   30 year old male Height:      74 inches Weight:      188 pounds BMI:     24.23 BSA:     2.12 Pulse rate:   80 / minute Pulse rhythm:   regular BP sitting:   118 / 76  (left arm)  Vitals Entered By: Merri Ray CMA Duncan Dull) (October 13, 2009 2:04 PM)   Impression & Recommendations:  Problem # 1:  COLITIS, ULCERATIVE (ICD-556.9) Assessment Improved He continues to do well on slow steroid taper while maintaining Imuran and Azulfidine.  The patient wishes to defer any type of elective surgery until he shows a definite flareup at which point he will then consider total colectomy with an ileal anal pull-through.  Patient Instructions: 1)  Prednisone taper: 2)  For 1 week alternate 10mg  with 5 mg 3)  Then start 5 mg for 1 week 4)  Then for 1 week 5mg  every other day 5)  Then discontinue the Prednisone 6)  The medication list was reviewed and reconciled.  All changed / newly prescribed medications were explained.  A complete medication list was provided to the patient / caregiver.

## 2010-09-15 NOTE — Miscellaneous (Signed)
  Clinical Lists Changes Pt was scheduled for colonoscopy on 04/02/10.  He received fentanyl 100ug IV, versed 10mg  IV, benadryl 50mg  IV without any significant sedatory effects.  It was decided to cancel the procedure and reschedule at [facility] endo where propofol would be administered.

## 2010-09-15 NOTE — Progress Notes (Signed)
Summary: CALL  Phone Note Call from Patient Call back at Home Phone [phone] Call back at ok detailed vm   Summary of Call: Patient is requesting another call, did not understand the message that was left, see prevous phone note.  Initial call taken by: Lamar Sprinkles, CMA,  May 14, 2010 5:04 PM  Follow-up for Phone Call        Spoke w/patient. Explained per previous phone note. Follow-up by: Lamar Sprinkles, CMA,  May 14, 2010 5:50 PM

## 2010-09-15 NOTE — Progress Notes (Signed)
Summary: MED LIST UPDATE  Phone Note Call from Patient Call back at Home Phone [phone]   Caller: Patient Call For: Etta Grandchild MD Summary of Call: Pt called to let us know he is taking Xanex a total of 4.5 mg per day or 9 tabs per day. Pt's Psych/MD - Dr Tomasa Rand changed dosage. FYI. Initial call taken by: Verdell Face,  June 05, 2010 12:06 PM  Follow-up for Phone Call        EMR UPDATED Follow-up by: Lamar Sprinkles, CMA,  June 05, 2010 12:32 PM    New/Updated Medications: ALPRAZOLAM 0.5 MG TABS (ALPRAZOLAM) AS DIRECTED BY DR Tomasa Rand - PSYCH - up to 9 tablets daily

## 2010-09-15 NOTE — Progress Notes (Signed)
Summary: TRIAGE  Phone Note Call from Patient Call back at Home Phone [phone] Call back at [phone]   Caller: Dad Jodi Call For: Arlyce Dice Reason for Call: Talk to Nurse Summary of Call: Son has questions regarding prednisone Initial call taken by: Tawni Levy,  August 27, 2009 11:53 AM  Follow-up for Phone Call        Per pt. father--Pt. is on a Prednisone taper. Pt. is doing better. Needs instructions for continued taper. He is on 30mg  daily for 5 days.  1) Decrease to 20mg  daily for 5 days, then to 10mg  daily for 5 days then call w/a condition update and further taper instructions. 2) If symptoms become worse call back immediately.  Follow-up by: Laureen Ochs LPN,  August 27, 2009 12:17 PM  Additional Follow-up for Phone Call Additional follow up Details #1::        ok Additional Follow-up by: Louis Meckel MD,  August 27, 2009 5:02 PM

## 2010-09-15 NOTE — Progress Notes (Signed)
Summary: refill  Phone Note Call from Patient Call back at Home Phone [phone]   Caller: Patient Call For: Arlyce Dice Reason for Call: Talk to Nurse Summary of Call: Patient needs refils for his Imuran called in to [facility] [phone] Initial call taken by: Tawni Levy,  December 12, 2009 12:20 PM  Follow-up for Phone Call        Called pt to inform meds sent Follow-up by: Merri Ray CMA Duncan Dull),  December 12, 2009 1:39 PM    Prescriptions: IMURAN 50 MG  TABS (AZATHIOPRINE) 2  tabs once daily  #75 x 2   Entered by:   Merri Ray CMA (AAMA)   Authorized by:   Louis Meckel MD   Signed by:   Merri Ray CMA (AAMA) on 12/12/2009   Method used:   Electronically to        [facility]  [phone]* (retail)       [address]       [place], [place]  [address]       Ph: [phone] or [phone]       Fax: [phone]   RxID:   [id number]

## 2010-09-15 NOTE — Procedures (Signed)
Summary: Flexible Sigmoidoscopy  Patient: [name] Note: All result statuses are Final unless otherwise noted.  Tests: (1) Flexible Sigmoidoscopy (FLX)  FLX Flexible Sigmoidoscopy                             DONE     [facility]     [address]     [place], [place]  [address]           FLEXIBLE SIGMOIDOSCOPY PROCEDURE REPORT           PATIENT:  [name], [name]  MR#:  [id number]     BIRTHDATE:  08/15/1981, 28 yrs. old  GENDER:  male           ENDOSCOPIST:  Barbette Hair. Arlyce Dice, MD     Referred by:           PROCEDURE DATE:  09/17/2009     PROCEDURE:     ASA CLASS:  Class II     INDICATIONS:  diarrhea           MEDICATIONS:   Fentanyl 75 mcg IV, Versed 10 mg IV, Benadryl 50 mg     IV           DESCRIPTION OF PROCEDURE:   After the risks benefits and     alternatives of the procedure were thoroughly explained, informed     consent was obtained.  Digital rectal exam was performed and     revealed no abnormalities.   The [id number] endoscope     was introduced through the anus and advanced to the descending     colon, without limitations.  The quality of the prep was .  The     instrument was then slowly withdrawn as the mucosa was fully     examined.     <<PROCEDUREIMAGES>>           The rectum and sigmoid appeared normal (see image1, image2,     image4, image5, and image6). and descending colon   Retroflexed     views in the rectum revealed no abnormalities.    The scope was     then withdrawn from the patient and the procedure terminated.     COMPLICATIONS:  None           ENDOSCOPIC IMPRESSION:     1) Inactive colitis     RECOMMENDATIONS:     1) Call the office to schedule a followup office visit for 3     weeks     2) continue prednisone taper           REPEAT EXAM:  No           ______________________________     Barbette Hair. Arlyce Dice, MD           CC:  Etta Grandchild, MD           n.     Rosalie Doctor:   Barbette Hair. Jeanpierre Thebeau at 09/17/2009  03:44 PM           Mervin Kung, [id number]  Note: An exclamation mark (!) indicates a result that was not dispersed into the flowsheet. Document Creation Date: 09/17/2009 3:45 PM _______________________________________________________________________  (1) Order result status: Final Collection or observation date-time: 09/17/2009 15:39 Requested date-time:  Receipt date-time:  Reported date-time:  Referring Physician:   Ordering Physician: Melvia Heaps [phone]) Specimen Source:  Source: Launa Grill Order Number: [id number] Lab site:

## 2010-09-15 NOTE — Progress Notes (Signed)
Summary: ? about recall colon  Phone Note Outgoing Call Call back at [facility] Phone [phone]   Call placed by: Merri Ray CMA Duncan Dull),  Dec 31, 2009 2:33 PM Summary of Call: Called pt to remind him that his labs were due, Orders in [facility]...Marland Kitchen..   Dr Arlyce Dice Pt wants to know if he still needs the recall colonoscopy scheduled for 02/2010. He said he just got a Flex in Feb.  Initial call taken by: Merri Ray CMA Duncan Dull),  Dec 31, 2009 2:34 PM  Follow-up for Phone Call        He needs a full colo for colorectal cancer screening Follow-up by: Louis Meckel MD,  Jan 01, 2010 10:11 AM  Additional Follow-up for Phone Call Additional follow up Details #1::        Called pt to inform that he still needed his colonoscopy in July.  Additional Follow-up by: Merri Ray CMA [facility]),  Jan 01, 2010 11:52 AM

## 2010-09-15 NOTE — Progress Notes (Signed)
Summary: TRIAGE  Phone Note Call from Patient Call back at Home Phone [phone]   Caller: Patient Call For: Dr. Arlyce Dice Reason for Call: Talk to Nurse Summary of Call: Pt. decreased his prednisone from 30mg  to 20mg  and is having cramping and Diarrhea Initial call taken by: Karna Christmas,  September 05, 2009 3:06 PM  Follow-up for Phone Call        Pt. decreased Prednisone from 30mg  to 20mg  on Wednesday. Has increased cramping and more loose stools, watery today. 3-6 daily. Denies blood/mucus.  1) Use Bentyl 20mg  daily for 5 days, then daily as needed. 2) Stay on Prednisone 20mg  until further instructions  3) Soft,bland diet. No spicy,greasy,fried foods. Plenty of fortified fluids. 4) I will call pt., if new orders, after MD reviews.  [facility] PLEASE ADVISE  Follow-up by: Laureen Ochs LPN,  September 05, 2009 4:07 PM  Additional Follow-up for Phone Call Additional follow up Details #1::        Go back to 30mg  and c/b on Thurs Additional Follow-up by: Louis Meckel MD,  September 08, 2009 9:46 AM    Additional Follow-up for Phone Call Additional follow up Details #2::    Message left for pt. with above MD instructions. Pt. instructed to call back as needed.  Follow-up by: Laureen Ochs LPN,  September 08, 2009 9:54 AM

## 2010-09-15 NOTE — Assessment & Plan Note (Signed)
Summary: FLU SHOT [name]  Nurse Visit   Allergies: 1)  Keflex (Cephalexin)  Orders Added: 1)  Flu Vaccine 79yrs + MEDICARE PATIENTS [Q2039] 2)  Administration Flu vaccine - MCR [G0008]      Flu Vaccine Consent Questions     Do you have a history of severe allergic reactions to this vaccine? no    Any prior history of allergic reactions to egg and/or gelatin? no    Do you have a sensitivity to the preservative Thimersol? no    Do you have a past history of Guillan-Barre Syndrome? no    Do you currently have an acute febrile illness? no    Have you ever had a severe reaction to latex? no    Vaccine information given and explained to patient? yes    Are you currently pregnant? no    Lot Number:[id number]   Exp Date:02/13/2011   Site Given: Right Deltoid IMu

## 2010-09-15 NOTE — Progress Notes (Signed)
Summary: Condition Update  Phone Note Call from Patient Call back at Home Phone [phone]   Caller: Patient Call For: Arlyce Dice Reason for Call: Talk to Nurse Summary of Call: Patient is calling to let Dr Arlyce Dice know that he is doing better. Initial call taken by: Tawni Levy,  August 18, 2009 1:48 PM  Follow-up for Phone Call        Was seen on 08-13-09, increased Prednisone to 40mg  daily. Stools are soft formed, frequency is slightly decreased. Some blood on tissue. Pt. states he is doing better.   1) Continue Soft,bland diet. No spicy,greasy,fried foods. Advance diet as tolerated. 2) Prednisone 40mg  x7 days, then decrease to 30mg  daily for 7 days. Call back with a condition update and further Prednisone taper instructions 08-28-09. 3) If symptoms become worse call back immediately. Follow-up by: Laureen Ochs LPN,  August 18, 2009 3:36 PM  Additional Follow-up for Phone Call Additional follow up Details #1::        Decrease prednisone to 30 mg daily Bentyl to 20 mg in 5 days provided he feels well Additional Follow-up by: Louis Meckel MD,  August 18, 2009 4:04 PM    Additional Follow-up for Phone Call Additional follow up Details #2::    Above MD orders reviewed with patient.Pt. instructed to call back as needed.  Follow-up by: Laureen Ochs LPN,  August 18, 2009 4:24 PM  New/Updated Medications: DICYCLOMINE HCL 20 MG TABS (DICYCLOMINE HCL) Take one daily Prescriptions: DICYCLOMINE HCL 20 MG TABS (DICYCLOMINE HCL) Take one daily  #30 x 1   Entered by:   Laureen Ochs LPN   Authorized by:   Louis Meckel MD   Signed by:   Laureen Ochs LPN on 09/81/1914   Method used:   Electronically to        [facility]  [phone]* (retail)       [address]       [place], [place]  [address]       Ph: [phone] or [phone]       Fax: [phone]   RxID:   [id number]

## 2010-09-15 NOTE — Progress Notes (Signed)
  Phone Note Other Incoming   Request: Send information Summary of Call: Request received from [employer] forwarded to [employer].

## 2010-09-15 NOTE — Letter (Signed)
Summary: [facility]  [address] [place], [place] [address]   Phone: [phone]  Fax: [phone]       DERICO MITTON    17-Nov-1980    MRN: [id number]        Procedure Day /Date:TUESDAY 09/16/2009     Arrival Time:1:30PM     Procedure Time:2:30PM     Location of Procedure:                    [facility] ([facility])    PREPARATION FOR FLEXIBLE SIGMOIDOSCOPY WITH MAGNESIUM CITRATE  Prior to the day before your procedure, purchase one 8 oz. bottle of Magnesium Citrate and one Fleet Enema from the laxative section of your drugstore.  _________________________________________________________________________________________________  THE DAY BEFORE YOUR PROCEDURE             DATE: 09/15/2009    DAY MONDAY  1.   Have a clear liquid dinner the night before your procedure.  2.   Do not drink anything colored red or purple.  Avoid juices with pulp.  No orange juice.              CLEAR LIQUIDS INCLUDE: Water Jello Ice Popsicles Tea (sugar ok, no milk/cream) Powdered fruit flavored drinks Coffee (sugar ok, no milk/cream) Gatorade Juice: apple, white grape, white cranberry  Lemonade Clear bullion, consomm, broth Carbonated beverages (any kind) Strained chicken noodle soup Hard Candy   3.   At 7:00 pm the night before your procedure, drink one bottle of Magnesium Citrate over ice.  4.   Drink at least 3 more glasses of clear liquids before bedtime (preferably juices).  5.   Results are expected usually within 1 to 6 hours after taking the Magnesium Citrate.  ___________________________________________________________________________________________________  THE DAY OF YOUR PROCEDURE            DATE: 09/16/2009     DAY: TUESDAY  1.   Use Fleet Enema one hour prior to coming for procedure.  2.   You may drink clear liquids until 12:30PM (2 hours before exam)       MEDICATION INSTRUCTIONS  Unless otherwise  instructed, you should take regular prescription medications with a small sip of water as early as possible the morning of your procedure.          OTHER INSTRUCTIONS  You will need a responsible adult at least 32 years of age to accompany you and drive you home.   This person must remain in the waiting room during your procedure.  Wear loose fitting clothing that is easily removed.  Leave jewelry and other valuables at home.  However, you may wish to bring a book to read or an iPod/MP3 player to listen to music as you wait for your procedure to start.  Remove all body piercing jewelry and leave at home.  Total time from sign-in until discharge is approximately 2-3 hours.  You should go home directly after your procedure and rest.  You can resume normal activities the day after your procedure.  The day of your procedure you should not:   Drive   Make legal decisions   Operate machinery   Drink alcohol   Return to work  You will receive specific instructions about eating, activities and medications before you leave.   The above instructions have been reviewed and explained to me by   _______________________    I fully understand and can verbalize these instructions  _____________________________ Date _________

## 2010-09-15 NOTE — Progress Notes (Signed)
Summary: Lower Imuran and F/U labs  Phone Note Outgoing Call Call back at [facility] Phone [phone]   Call placed by: Merri Ray CMA Duncan Dull),  September 11, 2009 8:50 AM Summary of Call: Called pt to inform to have follow up labs drawn before his procedure next week. And to lower imuran to 100mg  per day. L/M to R/C if any questions Initial call taken by: Merri Ray CMA Duncan Dull),  September 11, 2009 8:52 AM

## 2010-09-15 NOTE — Progress Notes (Signed)
Summary: Medication refill  Phone Note Call from Patient Call back at Home Phone [phone]   Caller: Patient Call For: Dr. Arlyce Dice Reason for Call: Refill Medication Summary of Call: Needs refill on his Sulfasalazine med...[employer]. Initial call taken by: Karna Christmas,  June 24, 2010 4:57 PM  Follow-up for Phone Call        Called pt to inform meds sent Follow-up by: Merri Ray CMA Duncan Dull),  June 25, 2010 8:22 AM    New/Updated Medications: AZULFIDINE 500 MG  TABS (SULFASALAZINE) 4  tabs b.i.d. Prescriptions: IMURAN 50 MG  TABS (AZATHIOPRINE) 2  tabs once daily  #75 x 3   Entered by:   Merri Ray CMA (AAMA)   Authorized by:   Louis Meckel MD   Signed by:   Merri Ray CMA (AAMA) on 06/25/2010   Method used:   Electronically to        [facility]  [phone]* (retail)       [address]       [place], [place]  [address]       Ph: [phone] or [phone]       Fax: [phone]   RxID:   [id number] AZULFIDINE 500 MG  TABS (SULFASALAZINE) 4  tabs b.i.d.  #240 x 3   Entered by:   Merri Ray CMA (AAMA)   Authorized by:   Louis Meckel MD   Signed by:   Merri Ray CMA (AAMA) on 06/24/2010   Method used:   Electronically to        [facility]  [phone]* (retail)       [address]       [place], [place]  [address]       Ph: [phone] or [phone]       Fax: [phone]   RxID:   [id number]

## 2010-09-15 NOTE — Letter (Signed)
Summary: [facility] Instructions  [facility]  [address] [place], [place] [address]   Phone: [phone]  Fax: [phone]       [name]    26-Jun-1981    MRN: [id number]        Procedure Day Dorna Bloom: Lenor Coffin  04/02/10     Arrival Time: 9:30am     Procedure Time: 10:30am     Location of Procedure:                    _X _  [facility] (4th Floor)                        PREPARATION FOR COLONOSCOPY WITH MOVIPREP   Starting 5 days prior to your procedure  SATURDAY 08/13  do not eat nuts, seeds, popcorn, corn, beans, peas,  salads, or any raw vegetables.  Do not take any fiber supplements (e.g. Metamucil, Citrucel, and Benefiber).  THE DAY BEFORE YOUR PROCEDURE         DATE:  [facility]  08/17  1.  Drink clear liquids the entire day-NO SOLID FOOD  2.  Do not drink anything colored red or purple.  Avoid juices with pulp.  No orange juice.  3.  Drink at least 64 oz. (8 glasses) of fluid/clear liquids during the day to prevent dehydration and help the prep work efficiently.  CLEAR LIQUIDS INCLUDE: Water Jello Ice Popsicles Tea (sugar ok, no milk/cream) Powdered fruit flavored drinks Coffee (sugar ok, no milk/cream) Gatorade Juice: apple, white grape, white cranberry  Lemonade Clear bullion, consomm, broth Carbonated beverages (any kind) Strained chicken noodle soup Hard Candy                             4.  In the morning, mix first dose of MoviPrep solution:    Empty 1 Pouch A and 1 Pouch B into the disposable container    Add lukewarm drinking water to the top line of the container. Mix to dissolve    Refrigerate (mixed solution should be used within 24 hrs)  5.  Begin drinking the prep at 5:00 p.m. The MoviPrep container is divided by 4 marks.   Every 15 minutes drink the solution down to the next mark (approximately 8 oz) until the full liter is complete.   6.  Follow completed prep with 16 oz of clear liquid of your  choice (Nothing red or purple).  Continue to drink clear liquids until bedtime.  7.  Before going to bed, mix second dose of MoviPrep solution:    Empty 1 Pouch A and 1 Pouch B into the disposable container    Add lukewarm drinking water to the top line of the container. Mix to dissolve    Refrigerate  THE DAY OF YOUR PROCEDURE      DATE:  THURSDAY  08/18  Beginning at  5:30a.m. (5 hours before procedure):         1. Every 15 minutes, drink the solution down to the next mark (approx 8 oz) until the full liter is complete.  2. Follow completed prep with 16 oz. of clear liquid of your choice.    3. You may drink clear liquids until  8:30am (2 HOURS BEFORE PROCEDURE).   MEDICATION INSTRUCTIONS  Unless otherwise instructed, you should take regular prescription medications with a small sip of water   as early as possible  the morning of your procedure.  Diabetic patients - see separate instructions.  Stop taking Plavix or Aggrenox on  _  _  (7 days before procedure).     Stop taking Coumadin on  _ _  (5 days before procedure).  Additional medication instructions: _         OTHER INSTRUCTIONS  You will need a responsible adult at least 32 years of age to accompany you and drive you home.   This person must remain in the waiting room during your procedure.  Wear loose fitting clothing that is easily removed.  Leave jewelry and other valuables at home.  However, you may wish to bring a book to read or  an iPod/MP3 player to listen to music as you wait for your procedure to start.  Remove all body piercing jewelry and leave at home.  Total time from sign-in until discharge is approximately 2-3 hours.  You should go home directly after your procedure and rest.  You can resume normal activities the  day after your procedure.  The day of your procedure you should not:   Drive   Make legal decisions   Operate machinery   Drink alcohol   Return to work  You will  receive specific instructions about eating, activities and medications before you leave.    The above instructions have been reviewed and explained to me by   Clide Cliff, RN______________________    I fully understand and can verbalize these instructions _____________________________ Date _________

## 2010-09-15 NOTE — Progress Notes (Signed)
Summary: Flu vaccine ?  Phone Note Call from Patient Call back at [facility] Phone [phone] Call back at [facility] per pt   Summary of Call: Pt had flu vaccine last week. He is now c/o "flu like" symptoms, body aches, runny nose, & sore throat. He is on immunosuppressants and wants to know if this is related? Should he have flu vaccine in the future?  Initial call taken by: Lamar Sprinkles, CMA,  May 11, 2010 5:29 PM  Follow-up for Phone Call        probably not related, yes he should still receive the flu vaccine in the future Follow-up by: Etta Grandchild MD,  May 12, 2010 7:33 AM  Additional Follow-up for Phone Call Additional follow up Details #1::        left mess to call office back.............Marland KitchenLamar Sprinkles, CMA  May 12, 2010 11:57 AM   left mess to call office back............Marland KitchenLamar Sprinkles, CMA  May 13, 2010 10:50 AM   Pt informed of MD's advisement via VM per pt req Additional Follow-up by: Margaret Pyle, CMA,  May 14, 2010 9:11 AM

## 2010-09-15 NOTE — Miscellaneous (Signed)
Summary: RACLL COLON/YF  Clinical Lists Changes  Medications: Added new medication of MOVIPREP 100 GM  SOLR (PEG-KCL-NACL-NASULF-NA ASC-C) As directed - Signed Rx of MOVIPREP 100 GM  SOLR (PEG-KCL-NACL-NASULF-NA ASC-C) As directed;  #1 x 0;  Signed;  Entered by: Clide Cliff RN;  Authorized by: Louis Meckel MD;  Method used: Electronically to [facility]  [phone]*, [address] [place], [place], [place]  [address], Ph: [phone] or [phone], Fax: [phone] Observations: Added new observation of ALLERGY REV: Done (03/20/2010 15:25)    Prescriptions: MOVIPREP 100 GM  SOLR (PEG-KCL-NACL-NASULF-NA ASC-C) As directed  #1 x 0   Entered by:   Clide Cliff RN   Authorized by:   Louis Meckel MD   Signed by:   Clide Cliff RN on 03/20/2010   Method used:   Electronically to        [facility]  [phone]* (retail)       [address]       [place], [place]  [address]       Ph: [phone] or [phone]       Fax: [phone]   RxID:   [id number]

## 2010-09-15 NOTE — Assessment & Plan Note (Signed)
Summary: F/U APPT...LSW.   History of Present Illness Visit Type: follow up  Primary GI MD: Melvia Heaps MD [facility] Primary Provider: Etta Grandchild, MD Requesting Provider: na Chief Complaint: F/u for ulcerative colitis and diarrhea History of Present Illness:   [name] past return for followup of his ulcerative colitis.  He remains on Imuran 125 mg daily and prednisone.  When his prednisone was reduced from 30-20 mg he had some recurrence of symptoms including diarrhea.  Symptoms decreased slightly after decreased at 30 mg daily but they remain.   GI Review of Systems      Denies abdominal pain, acid reflux, belching, bloating, chest pain, dysphagia with liquids, dysphagia with solids, heartburn, loss of appetite, nausea, vomiting, vomiting blood, weight loss, and  weight gain.        Denies anal fissure, black tarry stools, change in bowel habit, constipation, diarrhea, diverticulosis, fecal incontinence, heme positive stool, hemorrhoids, irritable bowel syndrome, jaundice, light color stool, liver problems, rectal bleeding, and  rectal pain.    Current Medications (verified): 1)  Alprazolam 0.25 Mg Tabs (Alprazolam) .... One Tablet Three Times A Day As Needed 2)  Imuran 50 Mg  Tabs (Azathioprine) .... 2.5 Tabs Once Daily 3)  Azulfidine 500 Mg  Tabs (Sulfasalazine) .... 2 Tabs B.i.d. 4)  Geodon 80 Mg Caps (Ziprasidone Hcl) .... Take 1 Tablet By Mouth Once A Day 5)  Prednisone 10 Mg  Tabs (Prednisone) .... Taken 30mg  Daily 6)  Dicyclomine Hcl 20 Mg Tabs (Dicyclomine Hcl) .... Take One Daily  Allergies (verified): 1)  Keflex (Cephalexin)  Past History:  Past Medical History: Reviewed history from 11/27/2007 and no changes required. Current Problems:  ANXIETY (ICD-300.00) DEPRESSION (ICD-311) COLITIS, ULCERATIVE (ICD-556.9)  Past Surgical History: Reviewed history from 08/13/2009 and no changes required. Anal fissure  Family History: Reviewed history from 03/12/2009  and no changes required. lymphoma: Grandfather No FH of Colon Cancer: Family History of Colitis:aunt  Social History: Reviewed history from 03/05/2008 and no changes required. Occupation: Unemployed Patient has never smoked.  Alcohol Use - no Daily Caffeine Use- 2 Illicit Drug Use - no Patient gets regular exercise.  Review of Systems  The patient denies allergy/sinus, anemia, anxiety-new, arthritis/joint pain, back pain, blood in urine, breast changes/lumps, change in vision, confusion, cough, coughing up blood, depression-new, fainting, fatigue, fever, headaches-new, hearing problems, heart murmur, heart rhythm changes, itching, muscle pains/cramps, night sweats, nosebleeds, shortness of breath, skin rash, sleeping problems, sore throat, swelling of feet/legs, swollen lymph glands, thirst - excessive, urination - excessive, urination changes/pain, urine leakage, vision changes, and voice change.    Vital Signs:  Patient profile:   30 year old male Height:      74 inches Weight:      202 pounds BMI:     26.03 BSA:     2.18 Pulse rate:   68 / minute Pulse rhythm:   regular BP sitting:   110 / 74  (left arm) Cuff size:   regular  Vitals Entered By: Ok Anis CMA (September 10, 2009 3:18 PM)   Impression & Recommendations:  Problem # 1:  COLITIS, ULCERATIVE (ICD-556.9) He remains symptomatic despite moderate dose of prednisone.  Recommendations #1 flexible sigmoidoscopy #2 the patient continues to entertain the possibility of a total colectomy with ileoanal pull-through. Orders: TLB-CBC Platelet - w/Differential (85025-[id number]) TLB-Hepatic/Liver Function Pnl (80076-HEPATIC)  Appended Document: Orders Update    Clinical Lists Changes  Orders: Added new Test order of Flex with Sedation (Flex w/Sed) -  Signed

## 2010-09-15 NOTE — Progress Notes (Signed)
  Phone Note Other Incoming   Request: Send information Summary of Call: Request for records received from [employer] Request forwarded to [employer].

## 2010-09-15 NOTE — Progress Notes (Signed)
Summary: refill  Phone Note Call from Patient Call back at Home Phone [phone]   Caller: Patient Call For: Arlyce Dice Reason for Call: Talk to Nurse Summary of Call: Patient would like refills for Alprazolam until appt date 1-26 Initial call taken by: Tawni Levy,  September 02, 2009 12:14 PM  Follow-up for Phone Call        Already sent into pharmacy on 08/29/2009. Called and left pt a message. Follow-up by: Merri Ray CMA Duncan Dull),  September 02, 2009 1:18 PM

## 2010-09-15 NOTE — Letter (Signed)
Summary: Previsit letter  [facility]  [address] [place], [place] [address]   Phone: [phone]  Fax: [phone]       01/20/2010 MRN: [id number]  [name] [address] [place], [place]  [address]  Dear [name],  Welcome to the Gastroenterology Division at [facility].    You are scheduled to see a nurse for your pre-procedure visit on 03-20-10 at 3:30pm on the 3rd floor at [facility], [address]  We ask that you try to arrive at our office 15 minutes prior to your appointment time to allow for check-in.  Your nurse visit will consist of discussing your medical and surgical history, your immediate family medical history, and your medications.    Please bring a complete list of all your medications or, if you prefer, bring the medication bottles and we will list them.  We will need to be aware of both prescribed and over the counter drugs.  We will need to know exact dosage information as well.  If you are on blood thinners (Coumadin, Plavix, Aggrenox, Ticlid, etc.) please call our office today/prior to your appointment, as we need to consult with your physician about holding your medication.   Please be prepared to read and sign documents such as consent forms, a financial agreement, and acknowledgement forms.  If necessary, and with your consent, a friend or relative is welcome to sit-in on the nurse visit with you.  Your COLONOSCOPY  is scheduled for 04-02-10 at 10:30am but check in is at 9:30am.  Please bring your insurance card so that we may make a copy of it.  If your insurance requires a referral to see a specialist, please bring your referral form from your primary care physician.  No co-pay is required for this nurse visit.     If you cannot keep your appointment, please call [phone] to cancel or reschedule prior to your appointment date.  This allows [place] the opportunity to schedule an appointment for another patient in need  of care.    Thank you for choosing [facility] for your medical needs.  We appreciate the opportunity to care for you.  Please visit [place] at our website  to learn more about our practice.                     Sincerely.                                                                                                                   The Gastroenterology Division

## 2010-09-17 NOTE — Letter (Signed)
Summary: Lipid Letter  [facility]  [address] [place], [place] [address]   Phone: [phone]  Fax: [phone]    08/19/2010  [facility] [address] [place], [place]  [address]  Dear Lorin Picket:  We have carefully reviewed your last lipid profile from 08/18/2010 and the results are noted below with a summary of recommendations for lipid management.    Cholesterol:       180     Goal: <200   HDL "good" Cholesterol:   62.95     Goal: >40   LDL "bad" Cholesterol:   109     Goal: <160   Triglycerides:       59.0     Goal: <150        TLC Diet (Therapeutic Lifestyle Change): Saturated Fats & Transfatty acids should be kept < 7% of total calories ***Reduce Saturated Fats Polyunstaurated Fat can be up to 10% of total calories Monounsaturated Fat Fat can be up to 20% of total calories Total Fat should be no greater than 25-35% of total calories Carbohydrates should be 50-60% of total calories Protein should be approximately 15% of total calories Fiber should be at least 20-30 grams a day ***Increased fiber may help lower LDL Total Cholesterol should be < 200mg /day Consider adding plant stanol/sterols to diet (example: Benacol spread) ***A higher intake of unsaturated fat may reduce Triglycerides and Increase HDL    Adjunctive Measures (may lower LIPIDS and reduce risk of Heart Attack) include: Aerobic Exercise (20-30 minutes 3-4 times a week) Limit Alcohol Consumption Weight Reduction Aspirin 75-81 mg a day by mouth (if not allergic or contraindicated) Dietary Fiber 20-30 grams a day by mouth     Current Medications: 1)    Alprazolam 0.5 Mg Tabs (Alprazolam) .... As directed by dr Tomasa Rand - psych - up to 9 tablets daily 2)    Imuran 50 Mg  Tabs (Azathioprine) .... 2  tabs once daily 3)    Azulfidine 500 Mg  Tabs (Sulfasalazine) .... 2  tabs b.i.d. 4)    Geodon 80 Mg Caps (Ziprasidone hcl) .... Take 1 tablet by mouth once a day 5)    Zolpidem  Tartrate 10 Mg Tabs (Zolpidem tartrate) .... One by mouth qhs as needed for insomnia 6)    Lamictal 200 Mg Tabs (Lamotrigine) .... One tablet by mouth once daily  If you have any questions, please call. We appreciate being able to work with you.   Sincerely,    [facility] Etta Grandchild MD

## 2010-09-17 NOTE — Progress Notes (Signed)
Summary: Found error in his medical records that he would like corrected  Phone Note Call from Patient   Call For: Dr Arlyce Dice Reason for Call: Talk to Nurse Summary of Call: Was looking thru his notes from many years back. Noticed that it says under fam hx that is aunt has Ulcerative Colitis which is not true. He is not sure where that come from. Says no one in his family has ulcerative colitis. Initial call taken by: Leanor Kail [facility],  September 01, 2010 2:03 PM  Follow-up for Phone Call        Called pt to inform that I will remove the family hx of colitis from his chart. He stated no one in his family has every had colitis Follow-up by: Merri Ray CMA Duncan Dull),  September 01, 2010 2:13 PM     Appended Document: Preload correction      Family History: lymphoma: Grandfather No FH of Colon Cancer:    removed family hx of Ulcerative colitis in Aunt

## 2010-09-17 NOTE — Assessment & Plan Note (Signed)
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 He tells me that he saw his Psychologist ( Dr. Tomasa Rand ) yesterday and all is well wrt his depression symptoms and that it is ok for him to stay on Ambien.  Preventive Screening-Counseling & Management  Alcohol-Tobacco     Alcohol drinks/day: 0     Alcohol Counseling: not indicated; patient does not drink     Smoking Status: never     Passive Smoke Exposure: no     Tobacco Counseling: not indicated; no tobacco use  Hep-HIV-STD-Contraception     Hepatitis Risk: no risk noted     HIV Risk: no risk noted     STD Risk: no risk noted      Sexual History:  currently monogamous.        Drug Use:  never.        Blood Transfusions:  no.    Clinical Review Panels:  Prevention   Last Colonoscopy:  DONE (04/02/2010)  Immunizations   Last Tetanus Booster:  Tdap (02/20/2009)   Last Flu Vaccine:  Fluvax 3+ (05/08/2010)   Last PPD Date Read:  02/21/2009 (02/21/2009)   Last PPD Result:  negative (02/21/2009)  Lipid Management   Cholesterol:  224 (09/29/2009)   LDL (bad choesterol):  142 (03/06/2009)   HDL (good cholesterol):  54.90 (09/29/2009)  Diabetes Management   HgBA1C:  5.5 (03/05/2008)   Creatinine:  0.9 (06/14/2008)   Last Flu Vaccine:  Fluvax 3+ (05/08/2010)  CBC   WBC:  4.1 (01/01/2010)   RBC:  3.74 (01/01/2010)   Hgb:  13.1  (01/01/2010)   Hct:  38.4 (01/01/2010)   Platelets:  256.0 (01/01/2010)   MCV  102.6 (01/01/2010)   MCHC  34.0 (01/01/2010)   RDW  13.4 (01/01/2010)   PMN:  65.0 (01/01/2010)   Lymphs:  26.4 (01/01/2010)   Monos:  7.6 (01/01/2010)   Eosinophils:  0.3 (01/01/2010)   Basophil:  0.7 (01/01/2010)  Complete Metabolic Panel   Glucose:  93 (09/29/2009)   Sodium:  142 (06/14/2008)   Potassium:  4.2 (06/14/2008)   Chloride:  104 (06/14/2008)   CO2:  32 (06/14/2008)   BUN:  12 (06/14/2008)   Creatinine:  0.9 (06/14/2008)   Albumin:  4.7 (01/01/2010)   Total Protein:  6.9 (01/01/2010)   Calcium:  9.2 (06/14/2008)   Total Bili:  0.8 (01/01/2010)   Alk Phos:  40 (01/01/2010)   SGPT (ALT):  22 (01/01/2010)   SGOT (AST):  22 (01/01/2010)   Medications Prior to Update: 1)  Alprazolam 0.5 Mg Tabs (Alprazolam) .... As Directed By Dr Tomasa Rand - Psych - Up To  9 Tablets Daily 2)  Imuran 50 Mg  Tabs (Azathioprine) .... 2  Tabs Once Daily 3)  Azulfidine 500 Mg  Tabs (Sulfasalazine) .... 4  Tabs B.i.d. 4)  Geodon 80 Mg Caps (Ziprasidone Hcl) .... Take 1 Tablet By Mouth Once A Day 5)  Zolpidem Tartrate 10 Mg Tabs (Zolpidem Tartrate) .... One By Mouth Qhs As Needed For Insomnia  Current Medications (verified): 1)  Alprazolam 0.5 Mg Tabs (Alprazolam) .... As Directed By Dr Tomasa Rand - Psych - Up To 9 Tablets Daily 2)  Imuran 50 Mg  Tabs (Azathioprine) .... 2  Tabs Once Daily 3)  Azulfidine 500 Mg  Tabs (Sulfasalazine) .... 4  Tabs B.i.d. 4)  Geodon 80 Mg Caps (Ziprasidone Hcl) .... Take 1 Tablet By Mouth Once A Day 5)  Zolpidem Tartrate 10 Mg Tabs (Zolpidem Tartrate) .... One By Mouth Qhs As Needed For Insomnia  Allergies (verified): 1)  Keflex (Cephalexin)  Past History:  Past Medical History: Last updated: 05/06/2010 Current Problems:  ANXIETY (ICD-300.00) DEPRESSION (ICD-311)- Dr. Tomasa Rand COLITIS, ULCERATIVE (ICD-556.9)  Past Surgical History: Last updated: 08/13/2009 Anal  fissure  Family History: Last updated: 03/12/2009 lymphoma: Grandfather No FH of Colon Cancer: Family History of Colitis:aunt  Social History: Last updated: 05/06/2010 Occupation: Law school student Patient has never smoked.  Alcohol Use - no Daily Caffeine Use- 2 Illicit Drug Use - no Patient gets regular exercise.  Risk Factors: Alcohol Use: 0 (08/05/2010) Exercise: yes (03/05/2008)  Risk Factors: Smoking Status: never (08/05/2010) Passive Smoke Exposure: no (08/05/2010)  Family History: Reviewed history from 03/12/2009 and no changes required. lymphoma: Grandfather No FH of Colon Cancer: Family History of Colitis:aunt  Social History: Reviewed history from 05/06/2010 and no changes required. 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  Alcohol Use - no Daily Caffeine Use- 2 Illicit Drug Use - no Patient gets regular exercise.  Review of Systems  The patient denies anorexia, fever, weight loss, weight gain, chest pain, peripheral edema, prolonged cough, headaches, hemoptysis, abdominal pain, hematochezia, severe indigestion/heartburn, hematuria, suspicious skin lesions, depression, abnormal bleeding, and enlarged lymph nodes.   GI:  Denies abdominal pain, bloody stools, dark tarry stools, diarrhea, gas, indigestion, loss of appetite, nausea, vomiting, and vomiting blood. Psych:  Denies anxiety, depression, easily angered, easily tearful, irritability, mental problems, panic attacks, sense of great danger, suicidal thoughts/plans, and thoughts of violence.  Physical Exam  General:  alert, well-developed, well-nourished, well-hydrated, appropriate dress, normal appearance, healthy-appearing, cooperative to examination, and good hygiene.   Head:  normocephalic and atraumatic.   Eyes:  no icterus Mouth:  Oral mucosa and oropharynx without lesions or exudates.  Teeth in good repair. Neck:  supple, full ROM, no masses, and no HJR.   Lungs:  Normal  respiratory effort, chest expands symmetrically. Lungs are clear to auscultation, no crackles or wheezes. Heart:  Normal rate and regular rhythm. S1 and S2 normal without gallop, murmur, click, rub or other extra sounds. Abdomen:  Bowel sounds positive,abdomen soft and non-tender without masses, organomegaly or hernias noted. Msk:  No deformity or scoliosis noted of thoracic or lumbar spine.   Pulses:  R and L carotid,radial,femoral,dorsalis pedis and posterior tibial pulses are full and equal bilaterally Extremities:  No clubbing, cyanosis, edema, or deformity noted with normal full range of motion of all joints.   Neurologic:  No cranial nerve deficits noted. Station and gait are normal. Plantar reflexes are down-going bilaterally. DTRs are symmetrical throughout. Sensory, motor and coordinative functions appear intact. Skin:  Intact  without suspicious lesions or rashes Cervical Nodes:  no anterior cervical adenopathy and no posterior cervical adenopathy.   Psych:  Oriented X3, memory intact for recent and remote, normally interactive, good eye contact, not anxious appearing, not depressed appearing, not agitated, not suicidal, and not homicidal.     Impression & Recommendations:  Problem # 1:  INSOMNIA-SLEEP DISORDER-UNSPEC (ICD-780.52) Assessment Unchanged  His updated medication list for this problem includes:    Zolpidem Tartrate 10 Mg Tabs (Zolpidem tartrate) ..... One by mouth qhs as needed for insomnia  Problem # 2:  COLITIS, ULCERATIVE (ICD-556.9) Assessment: Improved  Complete Medication List: 1)  Alprazolam 0.5 Mg Tabs (Alprazolam) .... As directed by dr Tomasa Rand - psych - up to 9 tablets daily 2)  Imuran 50 Mg Tabs (Azathioprine) .... 2  tabs once daily 3)  Azulfidine 500 Mg Tabs (Sulfasalazine) .... 4  tabs b.i.d. 4)  Geodon 80 Mg Caps (Ziprasidone hcl) .... Take 1 tablet by mouth once a day 5)  Zolpidem Tartrate 10 Mg Tabs (Zolpidem tartrate) .... One by mouth qhs as  needed for insomnia  Patient Instructions: 1)  Please schedule a follow-up appointment in 6 months. Prescriptions: ZOLPIDEM TARTRATE 10 MG TABS (ZOLPIDEM TARTRATE) One by mouth qHS as needed for insomnia  #30 x 5   Entered and Authorized by:   Etta Grandchild MD   Signed by:   Etta Grandchild MD on 08/05/2010   Method used:   Print then Give to Patient   RxID:   [id number]    Orders Added: 1)  Est. Patient Level III [44034]

## 2010-09-17 NOTE — Assessment & Plan Note (Signed)
Summary: f/u from procedure--ch.   History of Present Illness Visit Type: Follow-up Visit Primary GI MD: Melvia Heaps MD [facility] Primary Provider: Etta Grandchild, MD Requesting Provider: na Chief Complaint: F/u from colon. Pt denies any GI complaints  History of Present Illness:   [name] has returned for followup of his ulcerative.  He remains in remission on a regimen of Imuran and Azulfidine.  Surveillance colonoscopy in August, 2007 demonstrated fairly active colitis without dysplasia.   GI Review of Systems      Denies abdominal pain, acid reflux, belching, bloating, chest pain, dysphagia with liquids, dysphagia with solids, heartburn, loss of appetite, nausea, vomiting, vomiting blood, weight loss, and  weight gain.        Denies anal fissure, black tarry stools, change in bowel habit, constipation, diarrhea, diverticulosis, fecal incontinence, heme positive stool, hemorrhoids, irritable bowel syndrome, jaundice, light color stool, liver problems, rectal bleeding, and  rectal pain.    Current Medications (verified): 1)  Alprazolam 0.5 Mg Tabs (Alprazolam) .... As Directed By Dr Tomasa Rand - Psych - Up To 9 Tablets Daily 2)  Imuran 50 Mg  Tabs (Azathioprine) .... 2  Tabs Once Daily 3)  Azulfidine 500 Mg  Tabs (Sulfasalazine) .... 2  Tabs B.i.d. 4)  Geodon 80 Mg Caps (Ziprasidone Hcl) .... Take 1 Tablet By Mouth Once A Day 5)  Zolpidem Tartrate 10 Mg Tabs (Zolpidem Tartrate) .... One By Mouth Qhs As Needed For Insomnia 6)  Lamictal 200 Mg Tabs (Lamotrigine) .... One Tablet By Mouth Once Daily  Allergies (verified): 1)  Keflex (Cephalexin)  Past History:  Past Medical History: ANXIETY (ICD-300.00) DEPRESSION (ICD-311)- Dr. Tomasa Rand COLITIS, ULCERATIVE (ICD-556.9)  Past Surgical History: Reviewed history from 08/13/2009 and no changes required. Anal fissure  Family History: Reviewed history from 03/12/2009 and no changes required. lymphoma: Grandfather No FH of  Colon Cancer: Family History of Colitis:aunt  Social History: Reviewed history from 05/06/2010 and no changes required. 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  Alcohol Use - no Daily Caffeine Use- 2 Illicit Drug Use - no Patient gets regular exercise.  Review of Systems  The patient denies allergy/sinus, anemia, anxiety-new, arthritis/joint pain, back pain, blood in urine, breast changes/lumps, change in vision, confusion, cough, coughing up blood, depression-new, fainting, fatigue, fever, headaches-new, hearing problems, heart murmur, heart rhythm changes, itching, menstrual pain, muscle pains/cramps, night sweats, nosebleeds, pregnancy symptoms, shortness of breath, skin rash, sleeping problems, sore throat, swelling of feet/legs, swollen lymph glands, thirst - excessive , urination - excessive , urination changes/pain, urine leakage, vision changes, and voice change.    Vital Signs:  Patient profile:   30 year old male Height:      74 inches Weight:      194 pounds BMI:     25.00 BSA:     2.15 Pulse rate:   74 / minute Pulse rhythm:   regular BP sitting:   116 / 64  (left arm) Cuff size:   regular  Vitals Entered By: Ok Anis CMA (August 18, 2010 9:20 AM)  Physical Exam  Additional Exam:  On physical exam he is a well-developed well-nourished male  skin: anicteric HEENT: normocephalic; PEERLA; no nasal or pharyngeal abnormalities neck: supple nodes: no cervical lymphadenopathy chest: clear to ausculatation and percussion heart: no murmurs, gallops, or rubs abd: soft, nontender; BS normoactive; no abdominal masses, tenderness, organomegaly rectal: deferred ext: no cynanosis, clubbing, edema skeletal: no deformities neuro: oriented x 3; no focal abnormalities  Impression & Recommendations:  Problem # 1:  COLITIS, ULCERATIVE (ICD-556.9) Patient remains in clinical remission on Imuran and Azulfidine.   Crepitations #1 continue current  medications #2 check CBC and LFTs every 6 months #3 yearly surveillance colonoscopy Orders: TLB-CBC Platelet - w/Differential (85025-CBCD) TLB-Hepatic/Liver Function Pnl (80076-HEPATIC)  Patient Instructions: 1)  Copy sent to : Benjamin Grandchild, MD 2)  You will go to the basement today for labs 3)  You will need to follow up in 6 months 4)  The medication list was reviewed and reconciled.  All changed / newly prescribed medications were explained.  A complete medication list was provided to the patient / caregiver.

## 2010-09-21 ENCOUNTER — Telehealth (INDEPENDENT_AMBULATORY_CARE_PROVIDER_SITE_OTHER): Payer: Self-pay

## 2010-09-22 ENCOUNTER — Other Ambulatory Visit: Payer: Self-pay

## 2010-09-22 ENCOUNTER — Telehealth: Payer: Self-pay | Admitting: Internal Medicine

## 2010-09-25 ENCOUNTER — Encounter (INDEPENDENT_AMBULATORY_CARE_PROVIDER_SITE_OTHER): Payer: Self-pay | Admitting: *Deleted

## 2010-09-25 ENCOUNTER — Other Ambulatory Visit: Payer: Self-pay | Admitting: Gastroenterology

## 2010-09-25 ENCOUNTER — Other Ambulatory Visit: Payer: Self-pay

## 2010-09-25 DIAGNOSIS — K5190 Ulcerative colitis, unspecified, without complications: Secondary | ICD-10-CM

## 2010-09-25 LAB — CBC WITH DIFFERENTIAL/PLATELET
Basophils Relative: 0.5 % (ref 0.0–3.0)
Eosinophils Absolute: 0 10*3/uL (ref 0.0–0.7)
Eosinophils Relative: 0.8 % (ref 0.0–5.0)
Lymphocytes Relative: 43.3 % (ref 12.0–46.0)
MCHC: 34.5 g/dL (ref 30.0–36.0)
MCV: 105.2 fl — ABNORMAL HIGH (ref 78.0–100.0)
Monocytes Absolute: 0.2 10*3/uL (ref 0.1–1.0)
Neutrophils Relative %: 47.9 % (ref 43.0–77.0)
Platelets: 182 10*3/uL (ref 150.0–400.0)
RBC: 3.48 Mil/uL — ABNORMAL LOW (ref 4.22–5.81)
WBC: 3.1 10*3/uL — ABNORMAL LOW (ref 4.5–10.5)

## 2010-09-30 ENCOUNTER — Telehealth: Payer: Self-pay | Admitting: Gastroenterology

## 2010-10-01 NOTE — Progress Notes (Signed)
Summary: Trouble sleeping  Phone Note Call from Patient Call back at [facility] Phone [phone]   Summary of Call: Patient is requesting new med to replace ambien. Med only works every 3 days. Or could med be increased?  Initial call taken by: Lamar Sprinkles, CMA,  September 22, 2010 11:24 AM  Follow-up for Phone Call        Pharm called pt about rx - insurance requres PA. Patient is requesting rx for generic alternative.  Follow-up by: Lamar Sprinkles, CMA,  September 22, 2010 1:23 PM  Additional Follow-up for Phone Call Additional follow up Details #1::        Pt informed of new rx Additional Follow-up by: Lamar Sprinkles, CMA,  September 23, 2010 4:56 PM    New/Updated Medications: SILENOR 6 MG TABS (DOXEPIN HCL) One by mouth at bedtime as needed for insomnia TRAZODONE HCL 50 MG TABS (TRAZODONE HCL) One by mouth at bedtime Prescriptions: TRAZODONE HCL 50 MG TABS (TRAZODONE HCL) One by mouth at bedtime  #30 x 11   Entered and Authorized by:   Etta Grandchild MD   Signed by:   Etta Grandchild MD on 09/22/2010   Method used:   Electronically to        [facility]* (retail)       [address]       [place], [place]  [address]       Ph: [phone]       Fax: [phone]   RxID:   [id number] SILENOR 6 MG TABS (DOXEPIN HCL) One by mouth at bedtime as needed for insomnia  #30 x 11   Entered and Authorized by:   Etta Grandchild MD   Signed by:   Etta Grandchild MD on 09/22/2010   Method used:   Electronically to        [facility]* (retail)       [address]       [place], [place]  [address]       Ph: [phone]       Fax: [phone]   RxID:   [id number]

## 2010-10-01 NOTE — Progress Notes (Signed)
Summary: CBC  ---- Converted from flag ---- ---- 08/19/2010 11:32 AM, Selinda Michaels RN wrote: Patient needs CBC in one month ------------------------------       Additional Follow-up for Phone Call Additional follow up Details #2::    Called patient and reminded him to come in for his CBC. Patient verbalized understanding. Follow-up by: Selinda Michaels RN,  September 21, 2010 8:36 AM

## 2010-10-07 NOTE — Progress Notes (Signed)
Summary: [name]  Phone Note Call from Patient Call back at [facility] Phone [phone]   Caller: Patient Call For: Dr. Arlyce Dice Reason for Call: Talk to Nurse Summary of Call: Pt is having stomach cramping every 10 to 15 mins and it is really severe, wants something called in for this Initial call taken by: [place] Johnson,  September 30, 2010 4:46 PM  Follow-up for Phone Call        Rx sent in for patient. Ok to call in Dicyclomine 20mg  #30 1 by mouth two times a day as needed cramping with 3 refills. Ok to call in per [employer], PA. Patient aware. Follow-up by: Selinda Michaels RN,  September 30, 2010 4:57 PM    New/Updated Medications: DICYCLOMINE HCL 20 MG TABS (DICYCLOMINE HCL) Take 1 by mouth two times a day as needed cramping Prescriptions: DICYCLOMINE HCL 20 MG TABS (DICYCLOMINE HCL) Take 1 by mouth two times a day as needed cramping  #30 x 3   Entered by:   Selinda Michaels RN   Authorized by:   Sammuel Cooper PA-c   Signed by:   Selinda Michaels RN on 09/30/2010   Method used:   Electronically to        [facility]* (retail)       [address]       [place], [place]  [address]       Ph: [phone]       Fax: [phone]   RxID:   [id number]

## 2010-11-27 ENCOUNTER — Other Ambulatory Visit: Payer: Self-pay | Admitting: Gastroenterology

## 2010-12-28 ENCOUNTER — Telehealth: Payer: Self-pay | Admitting: Gastroenterology

## 2010-12-28 NOTE — Telephone Encounter (Signed)
Pt states his stool is large and firm, hard to pass. He states he is having to strain quite a bit to pass it. Suggested pt try OTC stool softeners. Pt verbalized understanding.

## 2010-12-29 NOTE — H&P (Signed)
NAMESEIYA, SILSBY NO.:  [id number]   MEDICAL RECORD NO.:  [id number]          PATIENT TYPE:  INP   LOCATION:  1336                         FACILITY:  [facility]   PHYSICIAN:  Barbette Hair. Artist Pais, DO      DATE OF BIRTH:  1980-12-21   DATE OF ADMISSION:  03/02/2007  DATE OF DISCHARGE:                              HISTORY & PHYSICAL   CHIEF COMPLAINT:  Fever with dry mouth, abdominal cramping.   HISTORY OF PRESENT ILLNESS:  Patient is a 30 year old white male with a  past medical history of ulcerative colitis, who presents with gluteal  abscess and fever.  He was seen three days ago on February 27, 2007 in our  office secondary to the start of a gluteal cyst/pilonidal cyst.  He  recently underwent a colonoscopy with Dr. Arlyce Dice.  He noted worsening  redness, pain, and purulent drainage.  He did not have any fever at that  time.  Office exam revealed 3-4 cm indurated area 4-5 cm above his  rectum on the left side gluteal fold.  He was placed on Avelox 400 mg  once daily due to the patient's report of allergy to [facility].   Patient was taking his antibiotics faithfully but reports high fevers up  to 101.4 at home with dry mouth, abdominal cramping, and chills.  The  patient reports difficulty sleeping and bloody diarrhea q.2h.  Recent  colonoscopy of February 20, 2007 showed diffuse erythema.  He had lost  haustral folds and had spontaneous hemorrhage with friability of colonic  mucosa.  Dr. Arlyce Dice noted diffuse and moderately severe disease within  the first centimeter from the anus to sigmoid colon.   PAST MEDICAL HISTORY SUMMARY:  1. Ulcerative colitis, previously on Imuran.  2. Anxiety/depression.   CURRENT MEDICATIONS:  1. Lialda 1.2 gm 2 tablets in the a.m.  2. Alprazolam 0.25 mg q.8h. p.r.n.  3. Abilify 5 mg p.o. nightly.  4. Sertraline 100 mg 2 tabs after the evening meal.   ALLERGIES TO MEDICATIONS:  KEFLEX, which causes hives.   SOCIAL HISTORY:  Patient is single.  He  is a Buyer, retail of [employer].  He has plans to attend law school.   FAMILY HISTORY:  Mother and father are in their 1s.  Mother has  hyperlipidemia.  Father has morbid obesity and depression.   HABITS:  No alcohol.  No tobacco.  No recreational drug use.   REVIEW OF SYSTEMS:  As noted above.  All other systems are negative.   PHYSICAL EXAMINATION:  VITALS:  Weight is 196 pounds.  Temperature is  97.7, pulse 113, BP 109/63.  GENERAL:  The patient is a pleasant, well-developed, well-nourished 30-  year-old white male who appears somewhat pale.  HEENT:  Normocephalic, atraumatic.  Pupils are equal and reactive to  light bilaterally.  Extraocular motility was intact.  Patient was  anicteric.  Conjunctivae were within normal limits.  Oropharyngeal exam  revealed dry mucosa.  A question of oral thrush.  NECK:  Supple.  No adenopathy, carotid bruits, or thyromegaly.  LUNGS:  Normal respiratory effort.  Chest is clear  to auscultation  bilaterally.  No rales, rhonchi or wheezing.  CARDIOVASCULAR:  Regular rate and rhythm.  No significant murmurs, rubs  or gallops appreciated.  ABDOMEN:  Soft.  There was mild diffuse tenderness.  No rebound or  guarding.  SKIN:  Examination of his gluteal area reveals a smaller cyst with left  purulent drainage.  Still some induration.  NEUROLOGIC:  Cranial nerves II-XII are grossly intact.  He is nonfocal.   IMPRESSION:  1. Fever with possible sepsis.  2. Gluteal cyst/pilonidal cyst.  3. Ulcerative colitis flare.  4. Anxiety/depression.   RECOMMENDATIONS:  Patient will be admitted for IV fluids.  We will  obtain blood cultures x2 and start Zosyn 3.375 mg IV q.6h.  Patient  notes he has taken penicillin in the past without difficulty.  Unclear  at this time whether the patient's fever is from gluteal abscess or  translocation of bacteria from his severe ulcerative colitis flare.  He  is currently on a study medication for ulcerative colitis, and  GI will  be consulted.  I will defer starting prednisone to Dr. Arlyce Dice.      Barbette Hair. Artist Pais, DO  Electronically Signed     RDY/MEDQ  D:  03/02/2007  T:  03/02/2007  Job:  [id number]

## 2010-12-29 NOTE — Assessment & Plan Note (Signed)
[facility]                         GASTROENTEROLOGY OFFICE NOTE   JEFFREY, GRAEFE                  MRN:          [id number]  DATE:12/27/2006                            DOB:          [birth year]    PROBLEM:  Ulcerative colitis.  [name] has returned with a 2-week history  of loose bloody stools with passage of mucus.  He has had some mild  crampy abdominal pain.   MEDICATIONS:  1. He remains on Lialda 2.4 g a day.  2. Imuran 125 mg daily.   He has not been on any recent antibiotics.   PHYSICAL EXAM:  Pulse 78, blood pressure 110/70, weight 207.  HEENT:  EOMI. PERRLA. Sclerae are anicteric.  Conjunctivae are pink.  NECK:  Supple without thyromegaly, adenopathy or carotid bruits.  CHEST:  Clear to auscultation and percussion without adventitious  sounds.  CARDIAC:  Regular rhythm; normal S1 S2.  There are no murmurs, gallops  or rubs.  ABDOMEN:  Bowel sounds are normoactive.  Abdomen is soft, non-tender and  non-distended.  There are no abdominal masses, tenderness, splenic  enlargement or hepatomegaly.  EXTREMITIES:  Full range of motion.  No cyanosis, clubbing or edema.  RECTAL:  Deferred.   IMPRESSION:  Flare-up of ulcerative colitis.   RECOMMENDATION:  [name] will consider involvement in the UC study.  Failing that, I will start him on prednisone 40 mg a day and increase  his Lialda to 4.8 g a day.     Barbette Hair. Arlyce Dice, MD,FACG  Electronically Signed    RDK/MedQ  DD: 12/27/2006  DT: 12/27/2006  Job #: [id number]

## 2010-12-29 NOTE — Assessment & Plan Note (Signed)
[facility]                                 ON-CALL NOTE   [name], [name]                    MRN:          [id number]  DATE:02/28/2007                            DOB:          11/11/1980    Patient of Dr. Artist Pais.   Calls at 8:26 p.m. on February 28, 2007, stating he saw Dr. Artist Pais yesterday  and was given Avelox for pilonidal cyst and was told to call here if  fever.  States today he had a fever of 101.4 and took 2 Tylenol about 15  minutes ago.  He states that the pilonidal cyst was drained a little bit  yesterday and has been draining since he left Dr. Olegario Messier office and the  fever started just recently.   I recommend that the patient give the Tylenol a little bit of a chance  to work.  He has not done sitz baths that Dr. Artist Pais has recommended yet,  so I recommend that he do that.  If he is unable to get his fever down  using Tylenol or ibuprofen, he should go to the emergency room.  Otherwise follow up with Dr. Artist Pais in the morning.     Lelon Perla, DO  Electronically Signed    Shawnie Dapper  DD: 02/28/2007  DT: 03/01/2007  Job #: [id number]   cc:   Barbette Hair. Artist Pais, DO

## 2010-12-29 NOTE — Assessment & Plan Note (Signed)
[facility]                         GASTROENTEROLOGY OFFICE NOTE   CHIGOZIE, BASALDUA                  MRN:          [id number]  DATE:03/14/2007                            DOB:          01-15-1981    PROBLEM:  Ulcerative colitis.   Lorin Picket has returned following his hospitalization for a gluteal  cyst/pilonidal abscess.  He developed purulent discharge several cm  above his rectum, left gluteal fold.  He was treated with Avelox.  Diarrhea had worsened with bleeding, and he was placed on Solu-Medrol.  Imuran was continued.  Since discharge 1 week ago, he has done well.  Diarrhea is subsiding, and he has had minimal bleeding.   MEDICATIONS:  Include:  1. Prednisone 40 mg a day.  2. Lialda 2.4 gm daily.  3. Imuran 125 mg a day.   EXAMINATION:  Pulse 72.  Blood pressure 100/74.  Weight 193.   IMPRESSION:  1. Infected pilonidal cyst/resolved.  2. Ulcerative colitis.   RECOMMENDATIONS:  Steroid taper on a weekly basis by 10 mg until 20 mg,  then by 5 mg until 10 mg, and then by 2.5 mg a week.     Barbette Hair. Arlyce Dice, MD,FACG  Electronically Signed    RDK/MedQ  DD: 03/14/2007  DT: 03/15/2007  Job #: [id number]

## 2010-12-29 NOTE — Consult Note (Signed)
[name], [name] NO.:  [id number]   MEDICAL RECORD NO.:  [id number]          PATIENT TYPE:  INP   LOCATION:  1336                         FACILITY:  [facility]   PHYSICIAN:  Wilmon Arms. Corliss Skains, M.D. DATE OF BIRTH:  15-Apr-1981   DATE OF CONSULTATION:  03/03/2007  DATE OF DISCHARGE:                                 CONSULTATION   CONSULTING PHYSICIAN:  Dr. Wendall Papa   REASON FOR CONSULTATION:  Pilonidal abscess.   HISTORY OF PRESENT ILLNESS:  The patient is a 30 year old male with a  past medical history of ulcerative colitis who presents with a pilonidal  abscess that has developed over the last week.  He has been on some oral  antibiotics (Avelox) for the abscess.  This apparently became quite  large and extremely tender but then spontaneously began draining.  The  patient states that it is much more comfortable at this time.  There is  minimal tenderness.  The patient has had a flare of his ulcerative  colitis in the last several months with frequent daily bloody bowel  movements and cramping.  He was admitted and Dr. Christella Hartigan is planning on  starting high-dose steroids.  However, he wishes to have the pilonidal  abscess evaluated for possible need for surgery prior to starting the  steroids.   PAST MEDICAL HISTORY:  1. Ulcerative colitis.  2. Anxiety and depression.   PAST SURGICAL HISTORY:  Multiple colonoscopies, drainage of pilonidal  abscess.   ALLERGIES:  KEFLEX.   MEDICATIONS:  Lialda, Xanax, Abilify and sertraline.   FAMILY HISTORY:  Hyperlipidemia, morbid obesity and depression.   SOCIAL HISTORY:  Nonsmoker, nondrinker.  The patient is single, is a  Chief of Staff.   PHYSICAL EXAMINATION:  Temperature 99.6, pulse 117, blood pressure  112/69, saturations 98%.  This is a well-developed, well-nourished male  in no apparent distress.  HEENT:  EOMI.  Sclerae anicteric.  NECK:  No mass, no thyromegaly.  LUNGS:  Clear.  Normal respiratory effort.  HEART:  Regular rate and rhythm with no murmur.  ABDOMEN:  Soft.  Mild diffuse tenderness.  No rebound or guarding.  SKIN:  Examination of the coccygeal area shows a left-sided area of mild  erythema.  There is a central draining point with minimal purulent  drainage.  There is a very tiny amount of residual induration measuring  only about 1-and-a-half cm.  No fluctuance noted.   LABORATORIES:  Blood cultures are negative to date.  Electrolytes are  within normal limits.  INR is 1.4.  Liver functions are within normal  limits.  White count yesterday was 9.0, hemoglobin 10.4.  A CT scan of  the abdomen and pelvis is pending by Dr. Christella Hartigan.   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  It does not appear that this needs any further surgical  drainage.  However, the CT scan will help rule out any undrained pockets  of infection.   RECOMMENDATIONS:  Warm compresses p.r.n. to help keep the draining site  opened.  Would continue antibiotics.  Currently, he is on Zosyn.  If the  CT  scan does not show any deep pocket of infection then I would proceed  with high-dose steroids as planned.  As the patient recovers and his  ulcerative colitis is under control, then would consider a pilonidal  cystectomy on an elective basis.  We will continue to follow during this  hospitalization.      Wilmon Arms. Tsuei, M.D.  Electronically Signed     MKT/MEDQ  D:  03/03/2007  T:  03/04/2007  Job:  [id number]

## 2011-01-01 NOTE — Assessment & Plan Note (Signed)
[facility]                           PRIMARY CARE OFFICE NOTE   [name], [name]                  MRN:          [id number]  DATE:09/28/2006                            DOB:          01/01/1981    CHIEF COMPLAINT:  New patient to practice.   HISTORY OF PRESENT ILLNESS:  The patient is a 30 year old white male  here to establish primary care.  He was referred by Dr. Arlyce Dice of   GI.  He has a past medical history of ulcerative colitis  diagnosed initially in 2001.  The patient is currently on a combination  of Imuran and Lialda.  He had experienced the recent flare with diarrhea  and some rectal bleeding.  His Lialda has been titrated upward to 4.8 g  a day.  His diarrhea has not significantly improved and he has been  instructed to follow up for possible prednisone use.   His other history is significant for anxiety/depression; he is currently  by a psychologist, Dr. Ander Purpura.  He states that he has tried approximately  4 antidepressants without any significant improvement.  He does not  recall the names of medications.  He was on Adderall for possible ADD.  The patient experienced side-effects and has not been on any  psychotropic medications for the last 2 months.   He has recently started a new relationship and he broke up with his  previous girlfriend and requests STD testing.  He does not currently  have any symptoms.  There is no report of any high-risk sexual behavior.  There is no report of IV drug use in himself or his partner.   CURRENT MEDICATIONS:  1. Imuran 125 mg once a day.  2. Lialda 4.8 g a day.   ALLERGIES TO MEDICATIONS:  KEFLEX, which caused hives.   REVIEW OF SYSTEMS:  No fevers or chills.  The patient does not report  any problems with his vision.  No chest pain or shortness of breath.  He  has had diarrhea mixed with blood within the recent past, as noted  above.  No dysuria, frequency or urgency.  The patient  has not noted any  penile lesions.  He does report having a small cyst at the crease of his  buttocks, ? pilonidal cyst; it is not currently draining.   SOCIAL HISTORY:  The patient is single.  He graduated from [employer].  He was planning to attend law school.   FAMILY HISTORY:  Mother and father are in their 31s.  Mother has  hyperlipidemia; father, depression.  Multiple family members and his  grandmother with ulcerative colitis.   HABITS:  He does not drink.  He does not smoke.  He denies any  recreational drug use.   PHYSICAL EXAM:  VITALS:  Height is 6 feet 1-1/2.  Weight is 194 pounds.  Temperature is 97, pulse is 64, BP is 91/61 in the left arm in a seated  position.  GENERAL:  The patient is a thin 30 year old white male in no apparent  distress.  HEENT:  Normocephalic, atraumatic.  Pupils were equal and reactive  to  light bilaterally.  Extraocular motility was intact.  The patient was  anicteric.  Conjunctivae were within normal limits.  External auditory  canals and tympanic membranes are clear bilaterally.  Oropharyngeal exam  was unremarkable.  NECK:  Supple with no adenopathy, carotid bruit or thyromegaly.  CHEST:  Normal inspiratory effort.  Chest was clear to auscultation  bilaterally, no rhonchi, rales or wheezing.  CARDIOVASCULAR:  Regular rate and rhythm.  No significant murmurs, rubs,  or gallops appreciated.  ABDOMEN:  Soft.  There was mild left lower quadrant tenderness, no  organomegaly.  GENITALIA:  Normal descended testes.  No abnormal skin lesions were  noted on the shaft of his penis.  Examination of his anus did not reveal  any active sores.  BACK:  There was a small indentation from possible previous pilonidal  cyst, currently no redness or tenderness.  MUSCULOSKELETAL:  No clubbing, cyanosis, or edema.  SKIN:  Warm and dry.  NEUROLOGIC:  Cranial nerves II-XII were grossly intact.  The patient's  affect was somewhat flat.    IMPRESSION/RECOMMENDATIONS:  1. Ulcerative colitis with a recent flare.  He was instructed to call      Dr. Marzetta Board office and to start prednisone.  2. Mood disorder/anxiety/depression.  He is to follow up with his      psychiatrist.  He states he has a good therapeutic relationship.      We will try to obtain copies of previous records to note which      antidepressant he has tried in the past.  3. Health maintenance:  We will check human immunodeficiency virus      screen and RPR.  He had a normal genital exam and no evidence of      condyloma.   FOLLOWUP TIME:  Approximately 3 months.     Barbette Hair. Artist Pais, DO  Electronically Signed    RDY/MedQ  DD: 09/28/2006  DT: 09/28/2006  Job #: [id number]

## 2011-01-01 NOTE — Discharge Summary (Signed)
[name], [name] NO.:  [id number]   MEDICAL RECORD NO.:  [id number]          PATIENT TYPE:  INP   LOCATION:  1336                         FACILITY:  [facility]   PHYSICIAN:  Barbette Hair. Arlyce Dice, MD,FACGDATE OF BIRTH:  1981-08-04   DATE OF ADMISSION:  03/02/2007  DATE OF DISCHARGE:  03/06/2007                               DISCHARGE SUMMARY   ADMITTING DIAGNOSES:  1. The patient is a 30 year old white male known to Dr. Arlyce Dice, who      has a history of ulcerative colitis and recurrent pilonidal      abscess.  2. He also has chronic problems with anxiety and depression. The      patient presented at this time with fever and abdominal pain as      well as recurrent symptoms of pilonidal abscess. He had been placed      on Avelox as an outpatient for the pilonidal abscess but apparently      it became increasingly large and extremely tender and then actually      started spontaneously draining. He says he has been more      comfortable since that time but had been having quite a bit of      pain. He has also had flare of his ulcerative colitis symptoms and      has been having frequent bowel movements and bloody stools as well      as cramping. The patient was on Imuran at 125 mg per day and Lialda      at 2.4 grams daily until about 6 weeks ago when he stopped the      Imuran in order to get enrolled in a study. Apparently he had just      initiated the study drug about 7 or 8 days prior to this admission.      He had recent colonoscopy with Dr. Arlyce Dice which did show active      left-sided colitis particularly in the rectosigmoid area. He was      admitted on the internal medicine service per Dr. Artist Pais and then seen      by Dr. Christella Hartigan in consultation for GI. He states that his study drug      may be an anti TNF drug.   DISCHARGE DIAGNOSES:  1. Acute exacerbation of ulcerative colitis with pan colitis per CT,      possibly exacerbated by discontinuation of Imuran.  2.  Recurrent pilonidal cyst, spontaneously drained.  3. Anemia, normocytic.  4. Other diagnoses as listed above.   CONSULTATIONS:  Christella Hartigan, [facility].  Dr. Fannie Knee, surgery.   PROCEDURES:  None.   LABORATORY STUDIES:  On 7/17, WBC of 9.0, hemoglobin 10.4, hematocrit of  30.5, MCV of 93.6 and platelets 469.   On the 20th WBC was 9.2, hemoglobin 11.1, hematocrit of 32.5, platelets  678. Protime 17.2, INR of 1.4. Potassium was 3 on admission. Glucose  104, BUN 2, creatinine 0.9. Potassium corrected to 3.6 by the 18th.  Liver function studies normal. Albumin 2.4.   Blood cultures x2 were negative.   X-ray studies: CT scan of the abdomen  and pelvis on 7/18, showed a pan  colitis most consistent with ulcerative colitis.   HOSPITAL COURSE:  The patient was admitted to the service of Dr. Artist Pais for  internal medicine and then seen in consultation by Dr. Christella Hartigan. He had  had CT of the abdomen and pelvis with findings as outlined above. He was  placed on bowel rest, started on IV steroids 40 mg q. 8 h. He had a  fairly quick turnaround in his symptoms of colitis exacerbation with  initiation of the steroids and Lialda. He was also reinitiated on Imuran  at 125 mg daily. Given Bentyl as needed for cramping. Fortunately, his  pilonidal abscess drained spontaneously and there was no need for  debridement. He was seen by Dr. Fannie Knee who suggested he follow up with him  in his office in 1 month.   By 7/21, he was feeling much better, was having 3 to 5 bowel movements  per day, much less cramping and bleeding. Had no further drainage from  his pilonidal cyst and stated that it had pretty much completely  resolved.   He was felt stable for discharge. Was to follow up with Dr. Arlyce Dice in  the office in 1 week, 03/14/07, at 2:45 p.m. He was to call for any  problems in the interim.   DIET:  Regular.   MEDICATIONS:  1. Imuran 125 mg daily.  2. Prednisone 40 mg orally daily.  3. Avelox 400 daily x 5 more  days.  4. Vicodin 5/500 every 6 h p.r.n.  5. Lialda 2.4 grams daily.  6. Abilify 5 mg daily.  7. Zoloft 200 daily and Xanax as previous p.r.n.   CONDITION ON DISCHARGE:  Stable and improved.   DISPOSITION:  Obviously, the patient will be withdrawn from the colitis  study he was participating in as he requires his immunosuppressants and  steroids at this time.      [name] Esterwood, PA-C      Robert D. Arlyce Dice, MD,FACG  Electronically Signed    AE/MEDQ  D:  03/16/2007  T:  03/16/2007  Job:  [id number]

## 2011-01-01 NOTE — Assessment & Plan Note (Signed)
[facility]                         GASTROENTEROLOGY OFFICE NOTE   ZYSHONNE, MALECHA                  MRN:          [id number]  DATE:09/14/2006                            DOB:          1980/09/10    PROBLEM:  Ulcerative colitis.   REASON FOR VISIT:  Mr. Lightcap has returned with a recent flare of his  symptoms.  He is having more diarrhea and some bleeding.  He had been  off his medicines in December, though he has resumed them over the past  month.  He has pancolitis.   PHYSICAL EXAMINATION:  VITAL SIGNS:  Pulse 64.  Blood pressure 110/70.  Weight 184.   IMPRESSION:  Flare-up of ulcerative colitis.   RECOMMENDATIONS:  Increase Lialda to 4.8 grams a day while continuing  Imuran 125 mg a day.  I carefully instructed him to contact me if he is  not improved in three to four days, at which point I would start him on  prednisone.     Barbette Hair. Arlyce Dice, MD,FACG  Electronically Signed    RDK/MedQ  DD: 09/14/2006  DT: 09/14/2006  Job #: [id number]

## 2011-01-12 ENCOUNTER — Telehealth: Payer: Self-pay | Admitting: Gastroenterology

## 2011-01-12 NOTE — Telephone Encounter (Signed)
Pt wanted to know if he had ever been diagnosed with liver problems. Let the pt know that there is no mention of liver problems or issues in his chart.

## 2011-01-14 ENCOUNTER — Other Ambulatory Visit: Payer: Self-pay | Admitting: Internal Medicine

## 2011-01-14 NOTE — Telephone Encounter (Signed)
Refill request for: Ambien 10mg  Previously 'Printed'/phoned in to [facility] to inform Pt  Pt also would like advise concerning an taking up to 150mg  of Trazodone [50mg ] along w/Ambien for insomnia. Pt states that he is feeling fatigue after awakening and read that he could take this dosage safely in combination or would like advise as to an alternative medication to replace his present therapy for insomnia.

## 2011-01-18 NOTE — Telephone Encounter (Signed)
[facility] from [facility] asking if ok for pt to take 150mg -300 mg of Trazodone qhs. Please advise

## 2011-01-18 NOTE — Telephone Encounter (Signed)
yes

## 2011-01-19 NOTE — Telephone Encounter (Signed)
Pharmacy notified via fax.

## 2011-01-21 ENCOUNTER — Encounter: Payer: Self-pay | Admitting: Gastroenterology

## 2011-02-09 ENCOUNTER — Telehealth: Payer: Self-pay | Admitting: Gastroenterology

## 2011-02-09 NOTE — Telephone Encounter (Signed)
yes

## 2011-02-09 NOTE — Telephone Encounter (Signed)
Pt is due for his yearly colon. Pt wants to know if he needs an OV with Dr. Arlyce Dice first. Please advise.

## 2011-02-09 NOTE — Telephone Encounter (Signed)
Spoke with pt and he is aware of Dr. Marzetta Board recommendations. 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 He will call back with schedule.

## 2011-02-09 NOTE — Telephone Encounter (Signed)
Provided that symptoms are stable. He needs propofol

## 2011-02-09 NOTE — Telephone Encounter (Signed)
So ok to just schedule him as a direct propofol colon?

## 2011-02-11 ENCOUNTER — Telehealth: Payer: Self-pay | Admitting: Gastroenterology

## 2011-02-11 NOTE — Telephone Encounter (Signed)
Left message for pt to call back  °

## 2011-02-15 NOTE — Telephone Encounter (Signed)
Left message for pt to call back  °

## 2011-02-16 NOTE — Telephone Encounter (Signed)
Pt states he will go over his history to have some items such as smoking corrected in his health history.

## 2011-03-01 ENCOUNTER — Telehealth: Payer: Self-pay | Admitting: Gastroenterology

## 2011-03-02 ENCOUNTER — Telehealth: Payer: Self-pay

## 2011-03-02 ENCOUNTER — Other Ambulatory Visit: Payer: Self-pay | Admitting: Gastroenterology

## 2011-03-02 NOTE — Telephone Encounter (Signed)
Not true

## 2011-03-02 NOTE — Telephone Encounter (Signed)
Pt states he read online that you can only take Imuran for 15-20years and then you have to come off of it. Pt wants to know if this is true. Please advise.

## 2011-03-02 NOTE — Telephone Encounter (Signed)
Pt aware.

## 2011-03-02 NOTE — Telephone Encounter (Signed)
Pt scheduled for colon 03/19/11@8am  with propofol, Pt scheduled for previsit for 03/05/11@4PM . Reminded pt that he is due for a CBC and LFT lab draw also. Pt verbalized understanding.

## 2011-03-05 ENCOUNTER — Other Ambulatory Visit (INDEPENDENT_AMBULATORY_CARE_PROVIDER_SITE_OTHER): Payer: BC Managed Care – PPO

## 2011-03-05 ENCOUNTER — Ambulatory Visit (AMBULATORY_SURGERY_CENTER): Payer: BC Managed Care – PPO | Admitting: *Deleted

## 2011-03-05 VITALS — Ht 74.0 in | Wt 167.1 lb

## 2011-03-05 DIAGNOSIS — Z1211 Encounter for screening for malignant neoplasm of colon: Secondary | ICD-10-CM

## 2011-03-05 DIAGNOSIS — K5190 Ulcerative colitis, unspecified, without complications: Secondary | ICD-10-CM

## 2011-03-05 LAB — HEPATIC FUNCTION PANEL
Albumin: 4.5 g/dL (ref 3.5–5.2)
Alkaline Phosphatase: 46 U/L (ref 39–117)
Total Protein: 7.3 g/dL (ref 6.0–8.3)

## 2011-03-05 LAB — CBC WITH DIFFERENTIAL/PLATELET
Basophils Relative: 0.9 % (ref 0.0–3.0)
Eosinophils Absolute: 0 10*3/uL (ref 0.0–0.7)
HCT: 41.5 % (ref 39.0–52.0)
Hemoglobin: 14.1 g/dL (ref 13.0–17.0)
Lymphocytes Relative: 21.3 % (ref 12.0–46.0)
Lymphs Abs: 1 10*3/uL (ref 0.7–4.0)
MCHC: 34 g/dL (ref 30.0–36.0)
MCV: 98.1 fl (ref 78.0–100.0)
Neutro Abs: 3.3 10*3/uL (ref 1.4–7.7)
RBC: 4.23 Mil/uL (ref 4.22–5.81)
RDW: 12.3 % (ref 11.5–14.6)

## 2011-03-05 MED ORDER — PEG-KCL-NACL-NASULF-NA ASC-C 100 G PO SOLR: ORAL | Status: DC

## 2011-03-08 ENCOUNTER — Encounter: Payer: Self-pay | Admitting: Gastroenterology

## 2011-03-19 ENCOUNTER — Ambulatory Visit: Payer: BC Managed Care – PPO | Admitting: Gastroenterology

## 2011-03-19 ENCOUNTER — Encounter: Payer: BC Managed Care – PPO | Admitting: Gastroenterology

## 2011-03-19 ENCOUNTER — Encounter: Payer: Self-pay | Admitting: Gastroenterology

## 2011-03-19 VITALS — BP 98/56 | HR 80 | Temp 98.7°F | Resp 23 | Ht 74.0 in | Wt 167.0 lb

## 2011-03-19 DIAGNOSIS — Z1211 Encounter for screening for malignant neoplasm of colon: Secondary | ICD-10-CM

## 2011-03-19 DIAGNOSIS — K5289 Other specified noninfective gastroenteritis and colitis: Secondary | ICD-10-CM

## 2011-03-19 MED ORDER — SODIUM CHLORIDE 0.9 % IV SOLN: 500.0000 mL | INTRAVENOUS | Status: DC

## 2011-03-19 NOTE — Progress Notes (Signed)
PROPOFOL ADMINISTERED BY [facility] CRNA. SEE SCANNED INTRA PROCEDURE REPORT. EWM  1127AM- PTS RECOVERY STARTED IN THE PROCEDURE ROOM. SEE POST ASSESSMENT AND VITALS. EWM

## 2011-03-19 NOTE — Progress Notes (Signed)
Pt had no complaints on discharge. MAW 

## 2011-03-19 NOTE — Patient Instructions (Signed)
Refer to the picture page for the results of your colonoscopy exam.  Please follow the green and blue discharge instruction sheets for the rest of the day.  Resume your prior medications today.  Call if any questions or concerns.

## 2011-03-22 ENCOUNTER — Telehealth: Payer: Self-pay | Admitting: *Deleted

## 2011-03-22 NOTE — Telephone Encounter (Signed)

## 2011-04-02 ENCOUNTER — Telehealth: Payer: Self-pay | Admitting: Gastroenterology

## 2011-04-02 NOTE — Telephone Encounter (Signed)
Pt states he has had some diarrhea stools, he is complaining of abdominal pain. Pt has a prescription of Bentyl but it is expired. Pt wants to know if we can call in a new prescription for the Bentyl. Please advise.

## 2011-04-03 NOTE — Telephone Encounter (Signed)
yes

## 2011-04-04 NOTE — Telephone Encounter (Signed)
yes

## 2011-04-05 ENCOUNTER — Other Ambulatory Visit: Payer: Self-pay | Admitting: *Deleted

## 2011-04-05 DIAGNOSIS — E7800 Pure hypercholesterolemia, unspecified: Secondary | ICD-10-CM

## 2011-04-05 DIAGNOSIS — R739 Hyperglycemia, unspecified: Secondary | ICD-10-CM

## 2011-04-05 MED ORDER — DICYCLOMINE HCL 20 MG PO TABS: 20.0000 mg | ORAL_TABLET | Freq: Four times a day (QID) | ORAL | Status: DC

## 2011-04-05 NOTE — Telephone Encounter (Signed)
Addended by: Chrystie Nose R on: 04/05/2011 09:31 AM   Modules accepted: Orders

## 2011-04-13 ENCOUNTER — Other Ambulatory Visit (INDEPENDENT_AMBULATORY_CARE_PROVIDER_SITE_OTHER): Payer: BC Managed Care – PPO

## 2011-04-13 DIAGNOSIS — E7800 Pure hypercholesterolemia, unspecified: Secondary | ICD-10-CM

## 2011-04-13 DIAGNOSIS — R7309 Other abnormal glucose: Secondary | ICD-10-CM

## 2011-04-13 DIAGNOSIS — R739 Hyperglycemia, unspecified: Secondary | ICD-10-CM

## 2011-04-13 LAB — LIPID PANEL
HDL: 51.6 mg/dL (ref >39.00)
LDL Cholesterol: 97 mg/dL (ref 0–99)
Total CHOL/HDL Ratio: 3

## 2011-04-21 ENCOUNTER — Ambulatory Visit (INDEPENDENT_AMBULATORY_CARE_PROVIDER_SITE_OTHER): Payer: BC Managed Care – PPO | Admitting: Gastroenterology

## 2011-04-21 ENCOUNTER — Encounter: Payer: Self-pay | Admitting: Gastroenterology

## 2011-04-21 VITALS — BP 90/62 | HR 80 | Ht 74.0 in | Wt 169.8 lb

## 2011-04-21 DIAGNOSIS — K5190 Ulcerative colitis, unspecified, without complications: Secondary | ICD-10-CM

## 2011-04-21 NOTE — Patient Instructions (Signed)
Research will speak with you today Follow up labs in 6 months CBC and LFT's  Make a follow up appointment for 1 year

## 2011-04-21 NOTE — Assessment & Plan Note (Signed)
He appears to be having a flareup of his colitis.  Recommendations #1 patient will consider enrollment in an IBD trial; failing this I would consider a sigmoidoscopy and starting steroids

## 2011-04-21 NOTE — Progress Notes (Signed)
History of Present Illness:  [name] has returned because of recent diarrhea. Approximately a month ago he underwent surveillance colonoscopy which was entirely negative. There was no evidence for active or chronic colitis. Approximately a week ago he developed diarrhea and is now having about 3 stools daily accompanied  by severe urgency and incontinence.  There's been no change in his medications. He has not taken antibiotics. He is without abdominal pain.  Review of Systems: Pertinent positive and negative review of systems were noted in the above HPI section. All other review of systems were otherwise negative.    Current Medications, Allergies, Past Medical History, Past Surgical History, Family History and Social History were reviewed in [employer] [facility] electronic medical record  Vital signs were reviewed in today's medical record. Physical Exam: General: Well developed , well nourished, no acute distress Head: Normocephalic and atraumatic Eyes:  sclerae anicteric, EOMI Ears: Normal auditory acuity Mouth: No deformity or lesions Lungs: Clear throughout to auscultation Heart: Regular rate and rhythm; no murmurs, rubs or bruits Abdomen: Soft, non tender and non distended. No masses, hepatosplenomegaly or hernias noted. Normal Bowel sounds Rectal:deferred Musculoskeletal: Symmetrical with no gross deformities  Pulses:  Normal pulses noted Extremities: No clubbing, cyanosis, edema or deformities noted Neurological: Alert oriented x 4, grossly nonfocal Psychological:  Alert and cooperative. Normal mood and affect

## 2011-04-23 ENCOUNTER — Telehealth: Payer: Self-pay

## 2011-04-23 NOTE — Telephone Encounter (Signed)
Left message for pt to call back  °

## 2011-04-23 NOTE — Telephone Encounter (Signed)
Called pt to scheduled sigmoidoscopy. 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 He states if it is absolutely necessary he will try to come up with some time. Also states he still has the bill to pay 2,000 on the colonoscopy.   Pt wants to know if we can go ahead and call in script for prednisone. Please advise.

## 2011-04-23 NOTE — Telephone Encounter (Signed)
Sigmoidoscopy can be done at the very end of the day or even at the very start of the day. I think it's important to do this before we put him on steroids.

## 2011-04-23 NOTE — Telephone Encounter (Signed)
Message copied by Michele Mcalpine on Fri Apr 23, 2011 11:29 AM ------      Message from: Marlowe Kays      Created: Fri Apr 23, 2011 10:55 AM                   ----- Message -----         From: Louis Meckel, MD         Sent: 04/23/2011  10:22 AM           To: Merri Ray, CMA            Please schedule patient for sigmoidoscopy

## 2011-04-26 ENCOUNTER — Telehealth: Payer: Self-pay | Admitting: Gastroenterology

## 2011-04-26 NOTE — Telephone Encounter (Signed)
I think it's very important to do a sig.  We can make arrangements for a payment plan

## 2011-04-26 NOTE — Telephone Encounter (Signed)
Spoke with pt and he would like to do flex-sig Wed am 04/28/11 @8am . There is already a pt scheduled in the [facility] at 8:30am but there is not an 8am appt for you that day. Is this possible to do at 8am on 04/28/11?

## 2011-04-26 NOTE — Telephone Encounter (Signed)
Pt called back and states that Saturday he had 5 bloody stools, Sunday he had none. Today he has had 3 bowel movements with traces of blood in them. Pt knows that Dr. Arlyce Dice recommends a sigmoidoscopy but would like to know if we can start him on the prednisone without doing the sig. 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 Dr. Arlyce Dice please advise.

## 2011-04-26 NOTE — Telephone Encounter (Signed)
See Previous phone note ? ?

## 2011-04-27 ENCOUNTER — Telehealth: Payer: Self-pay | Admitting: Gastroenterology

## 2011-04-27 NOTE — Telephone Encounter (Signed)
Spoke with pt and let him know Dr. Arlyce Dice cannot do the flex-sig tomorrow at 8am. We are trying to get it scheduled around noon. Will call pt back as soon as I hear back from [facility].

## 2011-04-27 NOTE — Telephone Encounter (Signed)
No, I have a meeting until 8:30.  You can add him to my [facility] schedule in the late morning if the staff agrees

## 2011-04-27 NOTE — Telephone Encounter (Signed)
Spoke with Dixie in the [facility]. Pt scheduled for flex-sig at 12 noon. Prep instructions given to pt. He verbalized understanding and knows he must have someone with him for the procedure.

## 2011-04-28 ENCOUNTER — Ambulatory Visit (AMBULATORY_SURGERY_CENTER): Payer: BC Managed Care – PPO | Admitting: Internal Medicine

## 2011-04-28 ENCOUNTER — Encounter: Payer: Self-pay | Admitting: Internal Medicine

## 2011-04-28 VITALS — BP 108/65 | HR 89 | Temp 99.0°F | Resp 14 | Ht 74.0 in | Wt 160.0 lb

## 2011-04-28 DIAGNOSIS — R197 Diarrhea, unspecified: Secondary | ICD-10-CM

## 2011-04-28 DIAGNOSIS — K625 Hemorrhage of anus and rectum: Secondary | ICD-10-CM

## 2011-04-28 DIAGNOSIS — K5190 Ulcerative colitis, unspecified, without complications: Secondary | ICD-10-CM

## 2011-04-28 DIAGNOSIS — Z1211 Encounter for screening for malignant neoplasm of colon: Secondary | ICD-10-CM

## 2011-04-28 MED ORDER — SODIUM CHLORIDE 0.9 % IV SOLN: 500.0000 mL | INTRAVENOUS | Status: DC

## 2011-04-28 MED ORDER — MESALAMINE 1000 MG RE SUPP: 1000.0000 mg | Freq: Every day | RECTAL | Status: DC

## 2011-04-28 NOTE — Patient Instructions (Addendum)
There was inflammation in the rectum only. Will start Canasa suppositories nightly now and call with biopsy results. Dr. Arlyce Dice will get the information also and will coordinate with him. Iva Boop, MD, Clementeen Graham

## 2011-04-29 ENCOUNTER — Other Ambulatory Visit: Payer: Self-pay | Admitting: Gastroenterology

## 2011-04-29 ENCOUNTER — Telehealth: Payer: Self-pay | Admitting: *Deleted

## 2011-04-29 NOTE — Progress Notes (Signed)
Quick Note:  OFFICE  Let him know results show ulcerative colitis He should stay on the Canasa but if not seeing improvement in a few days would start prednisone at 20 mg daily x 1 week then 15 mg daily x 1 week then 10 mg daily x 1 week then 5 mg daily and if he starts call us for instructions - does he have any at home  Will need follow-up Dr. Arlyce Dice in about 1 month to reassess also  [facility] No recall or letter ______

## 2011-04-29 NOTE — Telephone Encounter (Signed)

## 2011-04-30 ENCOUNTER — Telehealth: Payer: Self-pay

## 2011-04-30 NOTE — Telephone Encounter (Signed)
Patient advised of results and Dr Marvell Fuller recommendations.  He will call next week with an update.  He is asked to continue canasa and call back next week for further directions.

## 2011-04-30 NOTE — Telephone Encounter (Signed)
Error

## 2011-04-30 NOTE — Telephone Encounter (Signed)
Message copied by Annett Fabian on Fri Apr 30, 2011  4:09 PM ------      Message from: Stan Head E      Created: Thu Apr 29, 2011 11:06 AM       OFFICE            Let him know results show ulcerative colitis      He should stay on the [place] but if not seeing improvement in a few days would start prednisone at 20 mg daily x 1 week then 15 mg daily x 1 week then 10 mg daily x 1 week then 5 mg daily  and if he starts call us for instructions - does he have any at home            Will need follow-up Dr. Arlyce Dice in about 1 month to reassess also            LEC      No recall or letter

## 2011-05-03 ENCOUNTER — Telehealth: Payer: Self-pay | Admitting: Gastroenterology

## 2011-05-03 MED ORDER — PREDNISONE 10 MG PO TABS: ORAL_TABLET | ORAL | Status: DC

## 2011-05-03 NOTE — Telephone Encounter (Signed)
Pt is calling back with an update on his condition. States that he is still passing some blood and mucous in his stool, still having some urgency and is having about 3 bowel movements/day. Per Dr. Marvell Fuller previous telephone note will call in Prednisone for the pt. Pt aware.

## 2011-05-06 ENCOUNTER — Other Ambulatory Visit: Payer: BC Managed Care – PPO | Admitting: Gastroenterology

## 2011-05-10 ENCOUNTER — Telehealth: Payer: Self-pay | Admitting: Gastroenterology

## 2011-05-10 MED ORDER — HYDROCORTISONE ACE-PRAMOXINE 1-1 % RE FOAM: 1.0000 | Freq: Every day | RECTAL | Status: DC

## 2011-05-10 NOTE — Telephone Encounter (Signed)
LMTCB  Pt aware and rx sent to pharmacy.

## 2011-05-10 NOTE — Telephone Encounter (Signed)
Stay on the prednisone 20 mg a day. He should also stay on his suppositories. Add ProctoFoam every morning and instructed to take the suppositories at that time. He he should call back in 4-5 days

## 2011-05-10 NOTE — Telephone Encounter (Signed)
Pt was placed on Prednisone by Dr. Leone Payor following his flex-sig. Pt states he was taking 20mg  daily for 7 days. He is supposed to cut back to 15mg  daily for 7 days starting today. He reports he is still having 3-5 stools per day and they still have traces of blood in them. He wants to know if he should still go down to 15mg  today or continue on 20mg  for a longer period of time. Dr. Arlyce Dice please advise.

## 2011-05-20 ENCOUNTER — Other Ambulatory Visit: Payer: Self-pay | Admitting: Gastroenterology

## 2011-05-20 NOTE — Telephone Encounter (Signed)
Left message for pt to call back.  Pt states that he is feeling better, he still has some urgency and some loose stools but nothing like he was having. Spoke with pt last on 05/10/11, he has been taking Prednisone 20mg  daily since then. Pt states he has 1 10mg  tablet left and will need a new script with instructions to taper down. Dr. Arlyce Dice please advise.

## 2011-05-20 NOTE — Telephone Encounter (Signed)
I need to know how he is doing clinically. How much prednisone was he taking a similar

## 2011-05-20 NOTE — Telephone Encounter (Signed)
Dr Arlyce Dice, Can this pt have a refill of Prednisone?? How many and refills?

## 2011-05-21 NOTE — Telephone Encounter (Signed)
Called in Prednisone to [facility], and they will put pts Prednisone Taper on the prescription bottle for pt. Called pt and L/M to inform med called in

## 2011-05-21 NOTE — Telephone Encounter (Signed)
Begin prednisone 15mg  qd x 5 days,                               10mg qd x 5                                10mg  alt with 5mg  qd x 5                                5mg  qd x 5                                5mg  qod x 5, then d/c Call if symptoms worsen when he tapers dose

## 2011-05-24 ENCOUNTER — Telehealth: Payer: Self-pay

## 2011-05-24 NOTE — Telephone Encounter (Signed)
Please advise if ok to refill ambien for pt. Last filled 01/14/11 w/ 3refills

## 2011-05-24 NOTE — Telephone Encounter (Signed)
yes

## 2011-05-25 MED ORDER — ZOLPIDEM TARTRATE 10 MG PO TABS: 10.0000 mg | ORAL_TABLET | Freq: Every evening | ORAL | Status: DC | PRN

## 2011-05-31 LAB — CBC
HCT: 32.5 — ABNORMAL LOW
Hemoglobin: 11.1 — ABNORMAL LOW
MCHC: 34.1
MCV: 93.6
Platelets: 469 — ABNORMAL HIGH
RDW: 12.2

## 2011-05-31 LAB — DIFFERENTIAL
Basophils Absolute: 0
Eosinophils Relative: 0
Eosinophils Relative: 4
Lymphocytes Relative: 10 — ABNORMAL LOW
Lymphocytes Relative: 12
Lymphs Abs: 1.1
Monocytes Absolute: 0.1 — ABNORMAL LOW
Monocytes Absolute: 0.9 — ABNORMAL HIGH
Monocytes Relative: 1 — ABNORMAL LOW

## 2011-05-31 LAB — CULTURE, BLOOD (ROUTINE X 2): Culture: NO GROWTH

## 2011-05-31 LAB — BASIC METABOLIC PANEL
CO2: 26
GFR calc non Af Amer: >60
Glucose, Bld: 112 — ABNORMAL HIGH
Potassium: 3.6
Sodium: 140

## 2011-05-31 LAB — COMPREHENSIVE METABOLIC PANEL
AST: 15
Albumin: 2.4 — ABNORMAL LOW
Calcium: 7.8 — ABNORMAL LOW
Creatinine, Ser: 0.9
GFR calc Af Amer: >60
Total Protein: 6.2

## 2011-05-31 LAB — PROTIME-INR: Prothrombin Time: 17.2 — ABNORMAL HIGH

## 2011-06-02 ENCOUNTER — Encounter: Payer: Self-pay | Admitting: Gastroenterology

## 2011-06-02 ENCOUNTER — Ambulatory Visit (INDEPENDENT_AMBULATORY_CARE_PROVIDER_SITE_OTHER): Payer: BC Managed Care – PPO | Admitting: Gastroenterology

## 2011-06-02 VITALS — BP 98/52 | HR 88 | Ht 74.0 in | Wt 169.6 lb

## 2011-06-02 DIAGNOSIS — K5190 Ulcerative colitis, unspecified, without complications: Secondary | ICD-10-CM

## 2011-06-02 NOTE — Progress Notes (Signed)
History of Present Illness:  [name] has returned for followup of a recent flare of his colitis. He is down to prednisone 5 mg a day. On this he has mild crampy pain and slightly loose stools with small amounts of mucus. He is without bleeding.    Review of Systems: Pertinent positive and negative review of systems were noted in the above HPI section. All other review of systems were otherwise negative.    Current Medications, Allergies, Past Medical History, Past Surgical History, Family History and Social History were reviewed in [employer] electronic medical record  Vital signs were reviewed in today's medical record. Physical Exam: General: Well developed , well nourished, no acute distress

## 2011-06-02 NOTE — Assessment & Plan Note (Addendum)
[name] had a recent flare that has responded to tapering steroids. At this point, however, he is having some recrudescense of symptoms at his current dose of 5 mg prednisone daily  Recommendations #1 patient was instructed to continue at 5 mg daily for another week and then to report his progress. I will hold any further tapering until symptoms have slightly improved.  He'll continue his other medications.

## 2011-06-02 NOTE — Patient Instructions (Signed)
Follow up in 1 month   

## 2011-06-24 ENCOUNTER — Other Ambulatory Visit: Payer: Self-pay | Admitting: Gastroenterology

## 2011-06-24 MED ORDER — DICYCLOMINE HCL 20 MG PO TABS: 20.0000 mg | ORAL_TABLET | Freq: Four times a day (QID) | ORAL | Status: DC

## 2011-06-24 NOTE — Telephone Encounter (Signed)
Medication sent to pharmacy today.

## 2011-07-05 ENCOUNTER — Telehealth: Payer: Self-pay | Admitting: Gastroenterology

## 2011-07-05 NOTE — Telephone Encounter (Signed)
Pt states that he stopped the Prednisone 5mg  daily and he is now having about 3 stools a day. He reports that they are loose, mucous stools and there is a little blood. Pt has exams coming up and wants to know if Dr. Arlyce Dice will resume his prednisone at a higher dose so that he can get through his exams. Dr. Arlyce Dice please advise.

## 2011-07-06 MED ORDER — PREDNISONE 10 MG PO TABS: ORAL_TABLET | ORAL | Status: DC

## 2011-07-06 NOTE — Telephone Encounter (Signed)
Pt aware and rx sent to pharmacy. 

## 2011-07-06 NOTE — Telephone Encounter (Signed)
He should restart prednisone 10 mg daily for one week then decrease to 10 alternating  with 5 for one week and 5 mg daily for one week and 5 mg every other day for a week and stop

## 2011-08-04 ENCOUNTER — Other Ambulatory Visit: Payer: Self-pay | Admitting: Internal Medicine

## 2011-08-05 ENCOUNTER — Telehealth: Payer: Self-pay | Admitting: Gastroenterology

## 2011-08-06 NOTE — Telephone Encounter (Signed)
Noted  

## 2011-09-24 ENCOUNTER — Telehealth: Payer: Self-pay | Admitting: Gastroenterology

## 2011-09-24 MED ORDER — PREDNISONE 20 MG PO TABS: 40.0000 mg | ORAL_TABLET | Freq: Every day | ORAL | Status: AC

## 2011-09-24 NOTE — Telephone Encounter (Signed)
Pt states he had loose stools for a week. Now he is having bloody diarrhea with some mucous in it. Pt is also having frequency. Pt is requesting Prednisone. Dr. Arlyce Dice please advise.

## 2011-09-24 NOTE — Telephone Encounter (Signed)
Okay to begin prednisone 40 mg daily. He should call back on Monday or Tuesday

## 2011-09-24 NOTE — Telephone Encounter (Signed)
Left message for pt to call back.  Pt aware and rx sent to pharmacy.

## 2011-09-27 ENCOUNTER — Telehealth: Payer: Self-pay | Admitting: Gastroenterology

## 2011-09-27 NOTE — Telephone Encounter (Signed)
Pt called to give an update, has been on Prednisone 40mg  daily since Friday. States he is having fewer bowel movements and only has the urgency when he has to go now. He is still seeing blood in his stool and some mucous.

## 2011-09-28 ENCOUNTER — Ambulatory Visit: Payer: BC Managed Care – PPO | Admitting: Gastroenterology

## 2011-09-28 NOTE — Telephone Encounter (Signed)
Left message for pt to call back  °

## 2011-09-28 NOTE — Telephone Encounter (Signed)
Continue prednisone 40 mg daily.  Call back on Friday. 

## 2011-09-28 NOTE — Telephone Encounter (Signed)
Spoke with pt and she is aware.

## 2011-10-12 ENCOUNTER — Telehealth: Payer: Self-pay | Admitting: Gastroenterology

## 2011-10-12 NOTE — Telephone Encounter (Signed)
Left message for pt to call back  °

## 2011-10-12 NOTE — Telephone Encounter (Signed)
Pt already scheduled for OV with Dr. Arlyce Dice 10/18/11@9 :15am. Pt aware of the prednisone taper instructions.

## 2011-10-12 NOTE — Telephone Encounter (Signed)
He can reduce pred to 30mg . Needs OV in next few days.   If stable can reduce pred to 20mg  in 5 days

## 2011-10-12 NOTE — Telephone Encounter (Signed)
Pt calling with an update. States he is still taking prednisone 40mg  daily. He is not seeing any blood in his stool anymore. Stools is formed at times and he is still seeing some mucous. Urgency is better. Please advise.

## 2011-10-18 ENCOUNTER — Ambulatory Visit (INDEPENDENT_AMBULATORY_CARE_PROVIDER_SITE_OTHER): Payer: BC Managed Care – PPO | Admitting: Gastroenterology

## 2011-10-18 ENCOUNTER — Encounter: Payer: Self-pay | Admitting: Gastroenterology

## 2011-10-18 VITALS — BP 122/78 | HR 130 | Ht 74.0 in | Wt 172.8 lb

## 2011-10-18 DIAGNOSIS — K5190 Ulcerative colitis, unspecified, without complications: Secondary | ICD-10-CM

## 2011-10-18 DIAGNOSIS — K5289 Other specified noninfective gastroenteritis and colitis: Secondary | ICD-10-CM

## 2011-10-18 DIAGNOSIS — K529 Noninfective gastroenteritis and colitis, unspecified: Secondary | ICD-10-CM

## 2011-10-18 NOTE — Patient Instructions (Signed)
You will go to the basement for labs today You will follow up labs again in 1 month and again in 3 months You will need to follow up in 3 months with Dr Arlyce Dice

## 2011-10-18 NOTE — Assessment & Plan Note (Addendum)
He has had several flares over the past year that has responded to prednisone.  Recommendations #1 increase Imuran to 150 mg daily. I will check his LFTs and CBC today and then  in 3 months #2 should he continue to flare several times a year on the higher dose of Imuran I would consider biologic therapy with either Humira or Remicade

## 2011-10-18 NOTE — Progress Notes (Signed)
History of Present Illness:  [name] has returned for followup of ulcerative colitis. In the last month he's had a flare for which he was placed on prednisone.  Symptoms are improving although he has mild urgency. He is on taper of prednisone and is down to 30 mg daily. He continues on Imuran 100 mg daily.    Review of Systems: Pertinent positive and negative review of systems were noted in the above HPI section. All other review of systems were otherwise negative.    Current Medications, Allergies, Past Medical History, Past Surgical History, Family History and Social History were reviewed in [employer] electronic medical record  Vital signs were reviewed in today's medical record. Physical Exam: General: Well developed , well nourished, no acute distress

## 2011-10-27 ENCOUNTER — Telehealth: Payer: Self-pay

## 2011-10-27 NOTE — Telephone Encounter (Signed)
Patient called stating that he has been seeing gastro for ulcerative colitis. Usually when he has [place] flares it causes hand shaking and tremors. He would like to know if there is something that MD can prescribed

## 2011-10-28 ENCOUNTER — Other Ambulatory Visit (INDEPENDENT_AMBULATORY_CARE_PROVIDER_SITE_OTHER): Payer: BC Managed Care – PPO

## 2011-10-28 DIAGNOSIS — K529 Noninfective gastroenteritis and colitis, unspecified: Secondary | ICD-10-CM

## 2011-10-28 DIAGNOSIS — K5289 Other specified noninfective gastroenteritis and colitis: Secondary | ICD-10-CM

## 2011-10-28 LAB — CBC WITH DIFFERENTIAL/PLATELET
Basophils Absolute: 0 10*3/uL (ref 0.0–0.1)
Eosinophils Absolute: 0 10*3/uL (ref 0.0–0.7)
HCT: 39.2 % (ref 39.0–52.0)
Lymphs Abs: 0.8 10*3/uL (ref 0.7–4.0)
MCHC: 33 g/dL (ref 30.0–36.0)
MCV: 102.6 fl — ABNORMAL HIGH (ref 78.0–100.0)
Monocytes Absolute: 0.5 10*3/uL (ref 0.1–1.0)
Monocytes Relative: 6.5 % (ref 3.0–12.0)
Platelets: 242 10*3/uL (ref 150.0–400.0)
RDW: 14.5 % (ref 11.5–14.6)

## 2011-10-28 LAB — HEPATIC FUNCTION PANEL: Total Bilirubin: 0.6 mg/dL (ref 0.3–1.2)

## 2011-11-30 ENCOUNTER — Telehealth: Payer: Self-pay | Admitting: *Deleted

## 2011-11-30 DIAGNOSIS — K5010 Crohn's disease of large intestine without complications: Secondary | ICD-10-CM

## 2011-11-30 NOTE — Telephone Encounter (Signed)
pATIENT NEEDS cbc AND lft'S FOLLOW UP ORDERS IN COMPUTER  CALLED PT TO INFORM LABS ARE DUE. HE WILL BE HERE THIS WEEK FOR LABS TO BE DRAWN

## 2011-11-30 NOTE — Telephone Encounter (Signed)
Message copied by Marlowe Kays on Tue Nov 30, 2011  2:17 PM ------      Message from: Marlowe Kays      Created: Mon Oct 18, 2011  9:52 AM      Regarding: labs       REMINDER PT NEED LABS CBC AND LFT'S IN 1 MONTH AND IN 3 MONTHS BEFORE 3 MONTH FOLLOW UP APPOINTMENT. WILL NEED TO PUT IN ORDERS

## 2011-12-24 ENCOUNTER — Ambulatory Visit: Payer: BC Managed Care – PPO

## 2011-12-24 DIAGNOSIS — K5010 Crohn's disease of large intestine without complications: Secondary | ICD-10-CM

## 2011-12-24 LAB — CBC WITH DIFFERENTIAL/PLATELET
Basophils Relative: 0.3 % (ref 0.0–3.0)
Eosinophils Relative: 0.7 % (ref 0.0–5.0)
HCT: 42.3 % (ref 39.0–52.0)
Lymphs Abs: 1.2 10*3/uL (ref 0.7–4.0)
MCV: 102.8 fl — ABNORMAL HIGH (ref 78.0–100.0)
Monocytes Absolute: 0.4 10*3/uL (ref 0.1–1.0)
Monocytes Relative: 6.8 % (ref 3.0–12.0)
RBC: 4.11 Mil/uL — ABNORMAL LOW (ref 4.22–5.81)
WBC: 5.5 10*3/uL (ref 4.5–10.5)

## 2011-12-24 LAB — HEPATIC FUNCTION PANEL
ALT: 26 U/L (ref 0–53)
AST: 30 U/L (ref 0–37)
Bilirubin, Direct: 0.1 mg/dL (ref 0.0–0.3)
Total Protein: 6.5 g/dL (ref 6.0–8.3)

## 2012-02-03 ENCOUNTER — Telehealth: Payer: Self-pay | Admitting: Gastroenterology

## 2012-02-04 NOTE — Telephone Encounter (Signed)
Wanted to thank Dr Arlyce Dice for article

## 2012-02-10 ENCOUNTER — Telehealth: Payer: Self-pay | Admitting: Gastroenterology

## 2012-02-10 MED ORDER — PREDNISONE 10 MG PO TABS: ORAL_TABLET | ORAL | Status: DC

## 2012-02-10 NOTE — Telephone Encounter (Signed)
Begin pred 20mg  qd/ C/b Mon

## 2012-02-10 NOTE — Telephone Encounter (Signed)
Pt states that he has started having loose bowel movements and more frequent BM's. Pt has started having abdominal cramping and thinks he has seen a trace of blood in his stool. He took dicyclomine today and it helped some. Pt wants to know if he should start taking some prednisone because he thinks this might be the start of a flare. Please advise.

## 2012-02-10 NOTE — Telephone Encounter (Signed)
Pt aware and rx sent to the pharmacy. 

## 2012-02-24 ENCOUNTER — Telehealth: Payer: Self-pay | Admitting: Gastroenterology

## 2012-02-24 NOTE — Telephone Encounter (Signed)
Pt stated he is doing better since he started prednisone 20mg /daily on 02/10/12; OK for him to start tapering off? Thanks.

## 2012-02-25 NOTE — Telephone Encounter (Signed)
Spoke with pt and he is aware of taper instructions.

## 2012-02-25 NOTE — Telephone Encounter (Signed)
15mg  x 1 week 10mg  x 1 week 10mg  alt with 5mg  x 1 week 5mg  x 1 week 5mg  qod x 1 week, then d/c

## 2012-03-13 ENCOUNTER — Telehealth: Payer: Self-pay | Admitting: Gastroenterology

## 2012-03-13 NOTE — Telephone Encounter (Signed)
Left message for pt to call back  °

## 2012-03-14 NOTE — Telephone Encounter (Signed)
Left message for pt to call back  °

## 2012-03-15 NOTE — Telephone Encounter (Signed)
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 He states he has thrown up 6-7 times today and had 6-7 bowel movements. There is no blood in the stool. Pt wants to know what he should do. Please advise.

## 2012-03-16 MED ORDER — ALPRAZOLAM 1 MG PO TABS: ORAL_TABLET | ORAL | Status: DC

## 2012-03-16 NOTE — Telephone Encounter (Signed)
Immodium and xanax 1mg  q8h prn

## 2012-03-16 NOTE — Telephone Encounter (Signed)
Pt aware and rx sent to pharmacy. 

## 2012-04-26 ENCOUNTER — Encounter: Payer: Self-pay | Admitting: Gastroenterology

## 2012-05-05 ENCOUNTER — Ambulatory Visit (AMBULATORY_SURGERY_CENTER): Payer: BC Managed Care – PPO | Admitting: *Deleted

## 2012-05-05 VITALS — Ht 74.0 in | Wt 183.0 lb

## 2012-05-05 DIAGNOSIS — K5190 Ulcerative colitis, unspecified, without complications: Secondary | ICD-10-CM

## 2012-05-05 MED ORDER — SUPREP BOWEL PREP KIT 17.5-3.13-1.6 GM/177ML PO SOLN: ORAL | Status: DC

## 2012-05-08 ENCOUNTER — Encounter: Payer: Self-pay | Admitting: Gastroenterology

## 2012-05-11 ENCOUNTER — Telehealth: Payer: Self-pay | Admitting: Gastroenterology

## 2012-05-11 ENCOUNTER — Encounter (HOSPITAL_COMMUNITY): Payer: Self-pay

## 2012-05-11 ENCOUNTER — Inpatient Hospital Stay (HOSPITAL_COMMUNITY)
Admission: AD | Admit: 2012-05-11 | Discharge: 2012-05-15 | DRG: 430 | Disposition: A | Discharge status: Home / Self Care | Payer: BC Managed Care – PPO | Source: Ambulatory Visit | Attending: Psychiatry | Admitting: Psychiatry

## 2012-05-11 ENCOUNTER — Emergency Department (HOSPITAL_COMMUNITY)
Admission: EM | Admit: 2012-05-11 | Discharge: 2012-05-11 | Disposition: A | Discharge status: Other Acute Inpatient Hospital | Payer: BC Managed Care – PPO | Attending: Emergency Medicine | Admitting: Emergency Medicine

## 2012-05-11 DIAGNOSIS — F329 Major depressive disorder, single episode, unspecified: Secondary | ICD-10-CM

## 2012-05-11 DIAGNOSIS — K5190 Ulcerative colitis, unspecified, without complications: Secondary | ICD-10-CM | POA: Diagnosis present

## 2012-05-11 DIAGNOSIS — F319 Bipolar disorder, unspecified: Secondary | ICD-10-CM | POA: Insufficient documentation

## 2012-05-11 DIAGNOSIS — F3189 Other bipolar disorder: Principal | ICD-10-CM | POA: Diagnosis present

## 2012-05-11 DIAGNOSIS — F3181 Bipolar II disorder: Secondary | ICD-10-CM

## 2012-05-11 DIAGNOSIS — F411 Generalized anxiety disorder: Secondary | ICD-10-CM

## 2012-05-11 DIAGNOSIS — F909 Attention-deficit hyperactivity disorder, unspecified type: Secondary | ICD-10-CM | POA: Diagnosis present

## 2012-05-11 DIAGNOSIS — F639 Impulse disorder, unspecified: Secondary | ICD-10-CM | POA: Insufficient documentation

## 2012-05-11 DIAGNOSIS — Z79899 Other long term (current) drug therapy: Secondary | ICD-10-CM

## 2012-05-11 DIAGNOSIS — Z23 Encounter for immunization: Secondary | ICD-10-CM

## 2012-05-11 DIAGNOSIS — R9431 Abnormal electrocardiogram [ECG] [EKG]: Secondary | ICD-10-CM

## 2012-05-11 HISTORY — DX: Attention-deficit hyperactivity disorder, unspecified type: F90.9

## 2012-05-11 LAB — CBC WITH DIFFERENTIAL/PLATELET
Basophils Absolute: 0 10*3/uL (ref 0.0–0.1)
Eosinophils Absolute: 0 10*3/uL (ref 0.0–0.7)
Eosinophils Relative: 1 % (ref 0–5)
Lymphs Abs: 1.5 10*3/uL (ref 0.7–4.0)
MCH: 36.1 pg — ABNORMAL HIGH (ref 26.0–34.0)
Neutrophils Relative %: 44 % (ref 43–77)
Platelets: 304 10*3/uL (ref 150–400)
RBC: 3.71 MIL/uL — ABNORMAL LOW (ref 4.22–5.81)
RDW: 14.7 % (ref 11.5–15.5)
WBC: 3.3 10*3/uL — ABNORMAL LOW (ref 4.0–10.5)

## 2012-05-11 LAB — COMPREHENSIVE METABOLIC PANEL
ALT: 30 U/L (ref 0–53)
AST: 36 U/L (ref 0–37)
Albumin: 4.2 g/dL (ref 3.5–5.2)
Alkaline Phosphatase: 47 U/L (ref 39–117)
Calcium: 8.8 mg/dL (ref 8.4–10.5)
GFR calc Af Amer: >90 mL/min (ref >90)
Glucose, Bld: 101 mg/dL — ABNORMAL HIGH (ref 70–99)
Potassium: 3.5 mEq/L (ref 3.5–5.1)
Sodium: 138 mEq/L (ref 135–145)
Total Protein: 6.9 g/dL (ref 6.0–8.3)

## 2012-05-11 LAB — RAPID URINE DRUG SCREEN, HOSP PERFORMED
Amphetamines: POSITIVE — AB
Barbiturates: NOT DETECTED
Benzodiazepines: POSITIVE — AB
Tetrahydrocannabinol: NOT DETECTED

## 2012-05-11 LAB — ETHANOL: Alcohol, Ethyl (B): <11 mg/dL (ref 0–11)

## 2012-05-11 LAB — SALICYLATE LEVEL: Salicylate Lvl: <2 mg/dL — ABNORMAL LOW (ref 2.8–20.0)

## 2012-05-11 MED ORDER — ZOLPIDEM TARTRATE 10 MG PO TABS: 10.0000 mg | ORAL_TABLET | Freq: Every evening | ORAL | Status: DC | PRN

## 2012-05-11 MED ORDER — AMPHETAMINE-DEXTROAMPHETAMINE 20 MG PO TABS: 20.0000 mg | ORAL_TABLET | Freq: Four times a day (QID) | ORAL | Status: DC

## 2012-05-11 MED ORDER — AMPHETAMINE-DEXTROAMPHETAMINE 20 MG PO TABS
20.0000 mg | ORAL_TABLET | Freq: Every day | ORAL | Status: DC
  Administered 2012-05-11: 20 mg via ORAL
  Filled 2012-05-11: qty 1

## 2012-05-11 MED ORDER — ZIPRASIDONE HCL 20 MG PO CAPS
60.0000 mg | ORAL_CAPSULE | Freq: Two times a day (BID) | ORAL | Status: DC
  Administered 2012-05-11: 60 mg via ORAL
  Filled 2012-05-11: qty 3

## 2012-05-11 MED ORDER — ALPRAZOLAM 1 MG PO TABS
1.0000 mg | ORAL_TABLET | Freq: Three times a day (TID) | ORAL | Status: DC
  Administered 2012-05-11 (×2): 1 mg via ORAL
  Filled 2012-05-11 (×2): qty 1

## 2012-05-11 MED ORDER — LAMOTRIGINE 200 MG PO TABS
200.0000 mg | ORAL_TABLET | Freq: Every day | ORAL | Status: DC
  Administered 2012-05-11: 200 mg via ORAL
  Filled 2012-05-11: qty 1

## 2012-05-11 MED ORDER — SULFASALAZINE 500 MG PO TABS
1000.0000 mg | ORAL_TABLET | Freq: Two times a day (BID) | ORAL | Status: DC
  Administered 2012-05-11 (×2): 1000 mg via ORAL
  Filled 2012-05-11 (×2): qty 2

## 2012-05-11 MED ORDER — AMPHETAMINE-DEXTROAMPHET ER 20 MG PO CP24: 20.0000 mg | ORAL_CAPSULE | Freq: Four times a day (QID) | ORAL | Status: DC

## 2012-05-11 MED ORDER — ZIPRASIDONE HCL 20 MG PO CAPS: 80.0000 mg | ORAL_CAPSULE | Freq: Every day | ORAL | Status: DC

## 2012-05-11 MED ORDER — MESALAMINE 1000 MG RE SUPP
1000.0000 mg | Freq: Every day | RECTAL | Status: DC
  Filled 2012-05-11: qty 1

## 2012-05-11 MED ORDER — LAMOTRIGINE 200 MG PO TABS
200.0000 mg | ORAL_TABLET | Freq: Every day | ORAL | Status: DC
  Filled 2012-05-11: qty 1

## 2012-05-11 MED ORDER — AZATHIOPRINE 50 MG PO TABS
150.0000 mg | ORAL_TABLET | Freq: Every day | ORAL | Status: DC
  Administered 2012-05-11: 150 mg via ORAL
  Filled 2012-05-11 (×2): qty 3

## 2012-05-11 MED ORDER — DICYCLOMINE HCL 20 MG PO TABS
20.0000 mg | ORAL_TABLET | Freq: Four times a day (QID) | ORAL | Status: DC | PRN
  Administered 2012-05-11: 20 mg via ORAL
  Filled 2012-05-11: qty 1

## 2012-05-11 MED ORDER — ZIPRASIDONE HCL 20 MG PO CAPS
40.0000 mg | ORAL_CAPSULE | Freq: Every morning | ORAL | Status: DC
  Administered 2012-05-11: 40 mg via ORAL
  Filled 2012-05-11: qty 2

## 2012-05-11 MED ORDER — TRAZODONE HCL 50 MG PO TABS
150.0000 mg | ORAL_TABLET | Freq: Every day | ORAL | Status: DC
  Administered 2012-05-11: 150 mg via ORAL
  Filled 2012-05-11: qty 1

## 2012-05-11 MED ORDER — TRAZODONE HCL 100 MG PO TABS: 300.0000 mg | ORAL_TABLET | Freq: Every day | ORAL | Status: DC

## 2012-05-11 MED ORDER — ALPRAZOLAM 1 MG PO TABS
1.0000 mg | ORAL_TABLET | Freq: Three times a day (TID) | ORAL | Status: DC | PRN
  Administered 2012-05-11: 1 mg via ORAL
  Filled 2012-05-11: qty 1

## 2012-05-11 NOTE — ED Notes (Signed)
Pt. in blue scrubs and red socks.  

## 2012-05-11 NOTE — ED Provider Notes (Signed)
Telepsych completed, recommends inpatient psych admission once medically cleared.  Medicatino adjustments recommended and done.:  Geodon 60 mg BID, lamictal 200 mg qhs, trazodone 150 mg qhs, xanax 1 mg tid, ambien 10 mg qhs prn, adderall xr 20 mg qam.  These were adjusted on orders.  Filed Vitals:   05/11/12 1239  BP: 132/83  Pulse: 105  Temp: 98.1 F (36.7 C)  Resp: 18     Gavin Pound. Nickalas Mccarrick, MD 05/11/12 1343

## 2012-05-11 NOTE — ED Notes (Signed)
Pt.'s 1 bag of belongings located behind blue zone secretary's desk

## 2012-05-11 NOTE — ED Notes (Signed)
Report called to Psych ED RN 

## 2012-05-11 NOTE — ED Notes (Signed)
Act  at bedside. 

## 2012-05-11 NOTE — ED Notes (Signed)
Pt. and belongings both wanded by security 

## 2012-05-11 NOTE — ED Notes (Signed)
[id number]<AV> Expected date:<BR> Expected time:<BR> Means of arrival:<BR> Comments:<BR> Triage

## 2012-05-11 NOTE — Progress Notes (Addendum)
Patient accepted by Caroleen Hamman, Nurse Practitioner to [facility]. Rosey Bath, RN. Dr. Orson Aloe, attending, 500-2.
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
## 2012-05-11 NOTE — ED Notes (Signed)
Pt. has 1 bag of belongings located behind nurses station in triage.

## 2012-05-11 NOTE — ED Notes (Signed)
Pt is being seen with IVC papers.  Per papers, pt threatened to kill himself and mom took out papers.  Pt lives with mom.  Recently kicked out of home with dad.  Pt is stating that he has not stated any "serious" thoughts of hurting self.  Denies SI/HI at this time.
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
## 2012-05-11 NOTE — Telephone Encounter (Signed)
No

## 2012-05-11 NOTE — ED Provider Notes (Signed)
History     CSN: [id number]  Arrival date & time 05/11/12  0102   First MD Initiated Contact with Patient 05/11/12 [phone]      Chief Complaint  Patient presents with  . Medical Clearance    (Consider location/radiation/quality/duration/timing/severity/associated sxs/prior treatment) HPI This is a 31 year old male who was brought here under involuntary commitment papers. The papers relate that the patient has been diagnosed as bipolar. He recently moved in with his mother after his father "kicked him out". The papers further state he exhibits signs of paranoia and often threatened suicide. His mother reports that he was going to get a gun and kill himself following an argument with her. This prompted her to take-out papers against him.  The patient himself denies suicidal or homicidal ideation. He states that he suffers from ulcerative colitis and sometimes when his symptoms become severe he makes statements that he doesn't really mean. He states that his mother misunderstood what he intended. He has been having some diarrhea but no bloody diarrhea. He has some lower abdominal pain which is not severe. He is scheduled for an annual colonoscopy so and is anticipating total colectomy at some point in the future.  Past Medical History  Diagnosis Date  . Anxiety   . Ulcerative colitis   . Bipolar 1 disorder   . Insomnia     Past Surgical History  Procedure Date  . Colonoscopy w/ biopsies     hx ulcerative colitis  . Anal fissurectomy     Family History  Problem Relation Age of Onset  . Lymphoma Paternal Grandmother   . Leukemia Maternal Grandmother     History  Substance Use Topics  . Smoking status: Never Smoker   . Smokeless tobacco: Never Used  . Alcohol Use: No      Review of Systems  All other systems reviewed and are negative.    Allergies  Cephalexin  Home Medications   Current Outpatient Rx  Name Route Sig Dispense Refill  . ALPRAZOLAM 1 MG PO TABS Oral  Take 1 mg by mouth 3 (three) times daily as needed.     . AMPHETAMINE-DEXTROAMPHET ER 20 MG PO CP24 Oral Take 20 mg by mouth 4 (four) times daily.    . AZATHIOPRINE 50 MG PO TABS Oral Take 150 mg by mouth daily.    . AZULFIDINE 500 MG PO TABS  TAKE (2) TABLETS TWICE DAILY. 120 each 6  . CANASA 1000 MG RE SUPP  INSERT ONE SUPPOSITORY RECTALLY AT BEDTIME. 14 each 1  . DICYCLOMINE HCL 20 MG PO TABS  TAKE 1 TABLET EVERY 6 HOURS AS NEEDED FOR STOMACH CRAMPS. 30 tablet 3  . LAMOTRIGINE 200 MG PO TABS Oral Take 200 mg by mouth daily.      . TRAZODONE HCL 300 MG PO TABS Oral Take 300 mg by mouth at bedtime.      Marland Kitchen ZIPRASIDONE HCL 40 MG PO CAPS Oral Take 40 mg by mouth. Take 1 tablet in am and 2 tablets pm     . ZOLPIDEM TARTRATE 10 MG PO TABS Oral Take 1 tablet (10 mg total) by mouth at bedtime as needed for sleep. 30 tablet 5  . SUPREP BOWEL PREP PO SOLN  Suprep take as directed no substitution 354 mL 0    Dispense as written.    BP 122/78  Pulse 120  Temp 97.4 F (36.3 C) (Oral)  Resp 18  SpO2 100%  Physical Exam General: Well-developed, well-nourished male in no  acute distress; appearance consistent with age of record HENT: normocephalic, atraumatic Eyes: pupils equal round and reactive to light; extraocular muscles intact Neck: supple Heart: regular rate and rhythm Lungs: clear to auscultation bilaterally Abdomen: soft; nondistended; mild lower abdominal tenderness; no masses or hepatosplenomegaly; bowel sounds present Extremities: No deformity; full range of motion; pulses normal Neurologic: Awake, alert and oriented; motor function intact in all extremities and symmetric; no facial droop Skin: Warm and dry Psychiatric: Normal mood and affect    ED Course  Procedures (including critical care time)     MDM   Nursing notes and vitals signs, including pulse oximetry, reviewed.  Summary of this visit's results, reviewed by myself:  Labs:  Results for orders placed during the  hospital encounter of 05/11/12  CBC WITH DIFFERENTIAL      Component Value Range   WBC 3.3 (*) 4.0 - 10.5 K/uL   RBC 3.71 (*) 4.22 - 5.81 MIL/uL   Hemoglobin 13.4  13.0 - 17.0 g/dL   HCT 16.1 (*) 09.6 - 04.5 %   MCV 102.4 (*) 78.0 - 100.0 fL   MCH 36.1 (*) 26.0 - 34.0 pg   MCHC 35.3  30.0 - 36.0 g/dL   RDW 40.9  81.1 - 91.4 %   Platelets 304  150 - 400 K/uL   Neutrophils Relative 44  43 - 77 %   Neutro Abs 1.4 (*) 1.7 - 7.7 K/uL   Lymphocytes Relative 46  12 - 46 %   Lymphs Abs 1.5  0.7 - 4.0 K/uL   Monocytes Relative 8  3 - 12 %   Monocytes Absolute 0.3  0.1 - 1.0 K/uL   Eosinophils Relative 1  0 - 5 %   Eosinophils Absolute 0.0  0.0 - 0.7 K/uL   Basophils Relative 1  0 - 1 %   Basophils Absolute 0.0  0.0 - 0.1 K/uL  COMPREHENSIVE METABOLIC PANEL      Component Value Range   Sodium 138  135 - 145 mEq/L   Potassium 3.5  3.5 - 5.1 mEq/L   Chloride 101  96 - 112 mEq/L   CO2 27  19 - 32 mEq/L   Glucose, Bld 101 (*) 70 - 99 mg/dL   BUN 8  6 - 23 mg/dL   Creatinine, Ser 7.82  0.50 - 1.35 mg/dL   Calcium 8.8  8.4 - 95.6 mg/dL   Total Protein 6.9  6.0 - 8.3 g/dL   Albumin 4.2  3.5 - 5.2 g/dL   AST 36  0 - 37 U/L   ALT 30  0 - 53 U/L   Alkaline Phosphatase 47  39 - 117 U/L   Total Bilirubin 0.5  0.3 - 1.2 mg/dL   GFR calc non Af Amer >90  >90 mL/min   GFR calc Af Amer >90  >90 mL/min  URINE RAPID DRUG SCREEN (HOSP PERFORMED)      Component Value Range   Opiates NONE DETECTED  NONE DETECTED   Cocaine NONE DETECTED  NONE DETECTED   Benzodiazepines POSITIVE (*) NONE DETECTED   Amphetamines POSITIVE (*) NONE DETECTED   Tetrahydrocannabinol NONE DETECTED  NONE DETECTED   Barbiturates NONE DETECTED  NONE DETECTED  ETHANOL      Component Value Range   Alcohol, Ethyl (B) <11  0 - 11 mg/dL  SALICYLATE LEVEL      Component Value Range   Salicylate Lvl <2.0 (*) 2.8 - 20.0 mg/dL  ACETAMINOPHEN LEVEL  Component Value Range   Acetaminophen (Tylenol), Serum <15.0  10 - 30 ug/mL              Hanley Seamen, MD 05/11/12 0530

## 2012-05-12 ENCOUNTER — Encounter (HOSPITAL_COMMUNITY): Payer: Self-pay

## 2012-05-12 ENCOUNTER — Encounter: Payer: BC Managed Care – PPO | Admitting: Gastroenterology

## 2012-05-12 DIAGNOSIS — F3189 Other bipolar disorder: Principal | ICD-10-CM

## 2012-05-12 DIAGNOSIS — F3181 Bipolar II disorder: Secondary | ICD-10-CM | POA: Diagnosis present

## 2012-05-12 DIAGNOSIS — F909 Attention-deficit hyperactivity disorder, unspecified type: Secondary | ICD-10-CM | POA: Diagnosis present

## 2012-05-12 MED ORDER — ADULT MULTIVITAMIN W/MINERALS CH
1.0000 | ORAL_TABLET | Freq: Every day | ORAL | Status: DC
  Administered 2012-05-12 – 2012-05-15 (×4): 1 via ORAL
  Filled 2012-05-12 (×6): qty 1

## 2012-05-12 MED ORDER — LAMOTRIGINE 200 MG PO TABS
200.0000 mg | ORAL_TABLET | Freq: Every day | ORAL | Status: DC
  Administered 2012-05-12 – 2012-05-15 (×4): 200 mg via ORAL
  Filled 2012-05-12 (×6): qty 1

## 2012-05-12 MED ORDER — TRAZODONE HCL 100 MG PO TABS
200.0000 mg | ORAL_TABLET | Freq: Every day | ORAL | Status: DC
  Filled 2012-05-12 (×2): qty 2

## 2012-05-12 MED ORDER — AZATHIOPRINE 50 MG PO TABS
150.0000 mg | ORAL_TABLET | Freq: Every day | ORAL | Status: DC
  Administered 2012-05-12 – 2012-05-15 (×5): 150 mg via ORAL
  Filled 2012-05-12 (×6): qty 3

## 2012-05-12 MED ORDER — SULFASALAZINE 500 MG PO TABS
1000.0000 mg | ORAL_TABLET | Freq: Two times a day (BID) | ORAL | Status: DC
  Administered 2012-05-12 – 2012-05-15 (×7): 1000 mg via ORAL
  Filled 2012-05-12 (×10): qty 2

## 2012-05-12 MED ORDER — ZIPRASIDONE HCL 40 MG PO CAPS
40.0000 mg | ORAL_CAPSULE | Freq: Every day | ORAL | Status: DC
  Administered 2012-05-12 – 2012-05-15 (×4): 40 mg via ORAL
  Filled 2012-05-12 (×6): qty 1

## 2012-05-12 MED ORDER — TRAZODONE HCL 100 MG PO TABS
200.0000 mg | ORAL_TABLET | Freq: Every evening | ORAL | Status: DC | PRN
  Administered 2012-05-14: 200 mg via ORAL
  Filled 2012-05-12: qty 2

## 2012-05-12 MED ORDER — MAGNESIUM HYDROXIDE 400 MG/5ML PO SUSP: 30.0000 mL | Freq: Every day | ORAL | Status: DC | PRN

## 2012-05-12 MED ORDER — MESALAMINE 1000 MG RE SUPP
1000.0000 mg | Freq: Every evening | RECTAL | Status: DC | PRN
  Filled 2012-05-12: qty 1

## 2012-05-12 MED ORDER — DICYCLOMINE HCL 20 MG PO TABS
20.0000 mg | ORAL_TABLET | Freq: Four times a day (QID) | ORAL | Status: DC | PRN
  Administered 2012-05-13 – 2012-05-15 (×6): 20 mg via ORAL
  Filled 2012-05-12 (×6): qty 1

## 2012-05-12 MED ORDER — ZIPRASIDONE HCL 80 MG PO CAPS
80.0000 mg | ORAL_CAPSULE | Freq: Every evening | ORAL | Status: DC
  Administered 2012-05-12 – 2012-05-15 (×4): 80 mg via ORAL
  Filled 2012-05-12 (×5): qty 1

## 2012-05-12 MED ORDER — ALUM & MAG HYDROXIDE-SIMETH 200-200-20 MG/5ML PO SUSP: 30.0000 mL | ORAL | Status: DC | PRN

## 2012-05-12 MED ORDER — ALPRAZOLAM 1 MG PO TABS
1.0000 mg | ORAL_TABLET | Freq: Three times a day (TID) | ORAL | Status: DC | PRN
  Administered 2012-05-14 – 2012-05-15 (×2): 1 mg via ORAL
  Filled 2012-05-12 (×3): qty 1

## 2012-05-12 MED ORDER — CITALOPRAM HYDROBROMIDE 20 MG PO TABS
20.0000 mg | ORAL_TABLET | Freq: Every day | ORAL | Status: DC
  Administered 2012-05-12 – 2012-05-15 (×4): 20 mg via ORAL
  Filled 2012-05-12 (×2): qty 1
  Filled 2012-05-12: qty 14
  Filled 2012-05-12 (×3): qty 1

## 2012-05-12 NOTE — Treatment Plan (Signed)
Interdisciplinary Treatment Plan Update (Adult)  Date: 05/12/2012  Time Reviewed: 11:15 AM   Progress in Treatment: Attending groups: Yes Participating in groups: Yes Taking medication as prescribed: Yes Tolerating medication: Yes   Family/Significant other contact made:  Have permission to call mom Patient understands diagnosis:  Yes  As evidenced by asking for help with depression related to medical problem[ IBD] Discussing patient identified problems/goals with staff:  Yes  See below Medical problems stabilized or resolved:  Yes Denies suicidal/homicidal ideation: Yes  In tx team Issues/concerns per patient self-inventory:  Yes  Needs his medications-have not yet been restarted Other:  New problem(s) identified: N/A  Reason for Continuation of Hospitalization: Depression Medication stabilization  Interventions implemented related to continuation of hospitalization: Resume outside meds  Anti-depressant trial  Encourage group attendance and participation  Additional comments: CM to contact mother for collaterol  Estimated length of stay: 1-3 days  Discharge Plan: return home, follow up outpt  New goal(s): N/A  Review of initial/current patient goals per problem list:   1.  Goal(s):Eliminate SI  Met:  Yes  Target date: 9/27  As evidenced [id number] report  2.  Goal (s): Stabilize mood  Met:  No  Target date:9/29  As evidenced [id number] will rate his depression at 3 or less  3.  Goal(s):  Met:  No  Target date:  As evidenced by:  4.  Goal(s):  Met:  No  Target date:  As evidenced by:  Attendees: Patient:  [name] 05/12/2012 11:15 AM  Family:     Physician:  Harvie Heck Readling 05/12/2012 11:15 AM   Nursing:    05/12/2012 11:15 AM   Case Manager:  Richelle Ito,  05/12/2012 11:15 AM   Counselor:   05/12/2012 11:15 AM   Other:     Other:     Other:     Other:      Scribe for Treatment Team:   Ida Rogue, 05/12/2012 11:15 AM
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
## 2012-05-12 NOTE — Progress Notes (Signed)
05/12/2012         Time: 1500      Group Topic/Focus: The focus of this group is on enhancing patients' problem solving skills, which involves identifying the problem, brainstorming solutions and choosing and trying a solution.  Participation Level: Did not attend  Participation Quality: Not Applicable  Affect: Not Applicable  Cognitive: Not Applicable   Additional Comments: Patient didn't attend.   Cozette Braggs 05/12/2012 3:44 PM

## 2012-05-12 NOTE — Tx Team (Signed)
Initial Interdisciplinary Treatment Plan  PATIENT STRENGTHS: (choose at least two) Ability for insight Average or above average intelligence Capable of independent living Financial means General fund of knowledge Motivation for treatment/growth Supportive family/friends Work skills  PATIENT STRESSORS: Health problems   PROBLEM LIST: Problem List/Patient Goals Date to be addressed Date deferred Reason deferred Estimated date of resolution  Ulcerative Colitis 9/26     Increased risk for SI 9/26     Depression 9/26                                          DISCHARGE CRITERIA:  Improved stabilization in mood, thinking, and/or behavior Medical problems require only outpatient monitoring Motivation to continue treatment in a less acute level of care Need for constant or close observation no longer present Reduction of life-threatening or endangering symptoms to within safe limits Safe-care adequate arrangements made Verbal commitment to aftercare and medication compliance  PRELIMINARY DISCHARGE PLAN: Outpatient therapy  PATIENT/FAMIILY INVOLVEMENT: This treatment plan has been presented to and reviewed with the patient, [name].  The patient and family have been given the opportunity to ask questions and make suggestions.  [name] A 05/12/2012, 6:11 AM
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
## 2012-05-12 NOTE — Progress Notes (Signed)
D: Patient denies SI. The patient has an anxious mood and affect. The patient didn't rate his depression and hopelessness on the self-inventory form (stated it "was just numbers"). The patient reports sleeping fairly well and states that his appetite and energy level are normal. The patient is attending groups and is interacting appropriately within the milieu. The patient states that he is "anxious about what medications the doctor may start" while he is here.   A: Patient given emotional support from RN. Patient encouraged to come to staff with concerns and/or questions. Patient's medication routine continued. Patient's orders and plan of care reviewed. Patient was given reassurance about medication concerns and was referred to the MD for those needs.  R: Patient remains appropriate and cooperative. Will continue to monitor patient q15 minutes for safety.
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
## 2012-05-12 NOTE — Progress Notes (Signed)
Pt is a 31 yr old male that reports some passive SI in response to his ulcerative colitis flare-ups. Pt denies wanting to actually kill him-self, but verbalizes outburst of wanting to harm himself during his flare-ups. His mother overheard him and had him seek help out of concern for him. Pt reports his mom as his primary support. At this time pt is denying any SI/HI or AVH. Pt has been orientated to the unit's policies and procedures. Pt remains safe at this time with q26min checks.

## 2012-05-12 NOTE — Progress Notes (Signed)
[facility] Group Notes: (Counselor/Nursing/MHT/Case Management/Adjunct) 05/12/2012   @1 :15 -2:30pm Preventing Relapse  Type of Therapy:  Group Therapy  Participation Level:  Minimal  Participation Quality: Attentive    Affect:  Blunted  Cognitive:  Appropriate  Insight:  None  Engagement in Group: Minimal  Engagement in Therapy:  None  Modes of Intervention:  Support and Exploration  Summary of Progress/Problems: [name] was present and seemed very attentive but did not share personally.  Angus Palms, LCSW 05/12/2012 2:23 PM

## 2012-05-12 NOTE — Progress Notes (Signed)
Psychoeducational Group Note  Date:  05/12/2012 Time:  1100  Group Topic/Focus:  Early Warning Signs:   The focus of this group is to help patients identify signs or symptoms they exhibit before slipping into an unhealthy state or crisis.  Participation Level: Did Not Attend  Participation Quality:  Not Applicable  Affect:  Not Applicable  Cognitive:  Not Applicable  Insight:  Not Applicable  Engagement in Group: Not Applicable  Additional Comments:  Pt did not attend group, pt remained in bed.  Karleen Hampshire Brittini 05/12/2012, 12:59 PM

## 2012-05-13 LAB — TSH: TSH: 0.783 u[IU]/mL (ref 0.350–4.500)

## 2012-05-13 LAB — T4, FREE: Free T4: 1.75 ng/dL (ref 0.80–1.80)

## 2012-05-13 NOTE — Progress Notes (Signed)
Psychoeducational Group Note  Date:  05/13/2012 Time: 1015  Group Topic/Focus:  Identifying Needs:   The focus of this group is to help patients identify their personal needs that have been historically problematic and identify healthy behaviors to address their needs.  Participation Level:  active Participation Quality: good Affect: flat Cognitive:    Insight:  good  Engagement in Group: engaged  Additional Comments: Pt was engaged in group, asked appropriate questions, shared personal life experience with the group and demonstrates willingness to process and understand her problems. PDuke RN [facility]

## 2012-05-13 NOTE — Progress Notes (Signed)
[facility] MD Progress Note  05/13/2012 2:05 PM  S: "I have been here for a couple of days.  I was feeling very depressed at the time that I was admitted to this hospital. I have ulcerative colitis. It started in 2001. I have been trying to deal with it, but it acts out sometimes. I was in the hospital in 2008 because of it. It keeps me depressed most of the time. I made a comment out of frustration, my mother over heard me and got concerned. Since I am here, the Dr. Had made some medication changes. I have so much going for me. 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  Diagnosis:   Axis I: Bipolar II disorder Axis II: Deferred Axis III:  Past Medical History  Diagnosis Date  . Anxiety   . Ulcerative colitis   . Bipolar 1 disorder   . Insomnia   . ADHD (attention deficit hyperactivity disorder)    Axis IV: economic problems, educational problems, housing problems, occupational problems and other psychosocial or environmental problems Axis V: 41-50 serious symptoms  ADL's:  Intact  Sleep: Good  Appetite:  Good  Suicidal Ideation: "No" Plan:  No Intent:  no Means:  no Homicidal Ideation: "no" Plan:  No Intent:  No Means:  no  AEB (as evidenced by):  Mental Status Examination/Evaluation: Objective:  Appearance: Casual  Eye Contact::  Good  Speech:  Clear and Coherent  Volume:  Normal  Mood:  "I'm feeling a little better"  Affect:  flat  Thought Process:  Coherent  Orientation:  Full  Thought Content:  Rumination  Suicidal Thoughts:  No  Homicidal Thoughts:  No  Memory:  Immediate;   Good Recent;   Good Remote;   Good  Judgement:  Fair  Insight:  Fair  Psychomotor Activity:  Normal  Concentration:  Good  Recall:  Good  Akathisia:  No  Handed:  Right  AIMS (if indicated):     Assets:  Desire for Improvement  Sleep:  Number of Hours: 6.25    Vital Signs:Blood pressure 126/80, pulse 103,  temperature 98.5 F (36.9 C), temperature source Oral, resp. rate 18, height 6' 0.5" (1.842 m), weight 79.379 kg (175 lb). Current Medications: Current Facility-Administered Medications  Medication Dose Route Frequency Provider Last Rate Last Dose  . ALPRAZolam (XANAX) tablet 1 mg  1 mg Oral TID PRN Ronny Bacon, MD      . alum & mag hydroxide-simeth (MAALOX/MYLANTA) 200-200-20 MG/5ML suspension 30 mL  30 mL Oral Q4H PRN Caroleen Hamman, NP      . azaTHIOprine (IMURAN) tablet 150 mg  150 mg Oral Daily Curlene Labrum Readling, MD   150 mg at 05/13/12 0921  . citalopram (CELEXA) tablet 20 mg  20 mg Oral Daily Curlene Labrum Readling, MD   20 mg at 05/13/12 0921  . dicyclomine (BENTYL) tablet 20 mg  20 mg Oral Q6H PRN Curlene Labrum Readling, MD   20 mg at 05/13/12 0731  . lamoTRIgine (LAMICTAL) tablet 200 mg  200 mg Oral Daily Curlene Labrum Readling, MD   200 mg at 05/13/12 1610  . magnesium hydroxide (MILK OF MAGNESIA) suspension 30 mL  30 mL Oral Daily PRN Caroleen Hamman, NP      . mesalamine (CANASA) suppository 1,000 mg  1,000 mg Rectal QHS PRN Ronny Bacon, MD      . multivitamin with minerals tablet 1 tablet  1 tablet Oral Daily Ronny Bacon, MD   1 tablet at 05/13/12 0921  . sulfaSALAzine (AZULFIDINE) tablet 1,000 mg  1,000 mg Oral BID Curlene Labrum Readling, MD   1,000 mg at 05/13/12 0921  . traZODone (DESYREL) tablet 200 mg  200 mg Oral QHS PRN Caroleen Hamman, NP      . ziprasidone (GEODON) capsule 40 mg  40 mg Oral Q breakfast Curlene Labrum Readling, MD   40 mg at 05/13/12 4098  . ziprasidone (GEODON) capsule 80 mg  80 mg Oral QPM Curlene Labrum Readling, MD   80 mg at 05/12/12 1701  . DISCONTD: traZODone (DESYREL) tablet 200 mg  200 mg Oral QHS Ronny Bacon, MD        Lab Results:  Results for orders placed during the hospital encounter of 05/11/12 (from the past 48 hour(s))  TSH     Status: Normal   Collection Time   05/12/12  7:30 PM      Component Value Range Comment   TSH 0.783  0.350 - 4.500 uIU/mL   T3, FREE      Status: Normal   Collection Time   05/12/12  7:30 PM      Component Value Range Comment   T3, Free 3.7  2.3 - 4.2 pg/mL   T4, FREE     Status: Normal   Collection Time   05/12/12  7:30 PM      Component Value Range Comment   Free T4 1.75  0.80 - 1.80 ng/dL     Physical Findings: AIMS: Facial and Oral Movements Muscles of Facial Expression: None, normal Lips and Perioral Area: None, normal Jaw: None, normal Tongue: None, normal,Extremity Movements Upper (arms, wrists, hands, fingers): None, normal Lower (legs, knees, ankles, toes): None, normal, Trunk Movements Neck, shoulders, hips: None, normal, Overall Severity Severity of abnormal movements (highest score from questions above): None, normal Incapacitation due to abnormal movements: None, normal Patient's awareness of abnormal movements (rate only patient's report): No Awareness, Dental Status Current problems with teeth and/or dentures?: No Does patient usually wear dentures?: No  CIWA:    COWS:     Treatment Plan Summary: Daily contact with patient to assess and evaluate symptoms and progress in treatment Medication management  Plan: No new changes made on the current treatment regimen. Continue current treatment plan.  Armandina Stammer I 05/13/2012, 2:05 PM

## 2012-05-13 NOTE — Progress Notes (Signed)
Patient ID: [name], male   DOB: December 05, 1980, 31 y.o.   MRN: [id number]  Pt. attended and participated in aftercare planning group. Pt. accepted information on suicide prevention, warning signs to look for with suicide and crisis line numbers to use. The pt. agreed to call crisis line numbers if having warning signs or having thoughts of suicide. Pt. shared that he was feeling pretty good this morning. No S/I or H/I.

## 2012-05-13 NOTE — Progress Notes (Signed)
Psychoeducational Group Note  Date:  05/13/2012 Time:  2000  Group Topic/Focus:  Wrap-Up Group:   The focus of this group is to help patients review their daily goal of treatment and discuss progress on daily workbooks.  Participation Level: Did Not Attend  Participation Quality:  Not Applicable  Affect:  Not Applicable  Cognitive:  Not Applicable  Insight:  Not Applicable  Engagement in Group: Not Applicable  Additional Comments:  The pt. Elected to rest rather than attend group.   Hazle Coca S 05/13/2012, 12:29 AM

## 2012-05-13 NOTE — Progress Notes (Signed)
[facility] Adult Inpatient Family/Significant Other Suicide Prevention Education  Suicide Prevention Education:  Education Completed; Boneta Lucks [phone] pt's mother has been identified by the patient as the family member/significant other with whom the patient will be residing, and identified as the person(s) who will aid the patient in the event of a mental health crisis (suicidal ideations/suicide attempt).  With written consent from the patient, the family member/significant other has been provided the following suicide prevention education, prior to the and/or following the discharge of the patient.  The suicide prevention education provided includes the following:  Suicide risk factors  Suicide prevention and interventions  National Suicide Hotline telephone number  [facility] assessment telephone number  [facility] Emergency Assistance 911  [facility] and/or Residential Mobile Crisis Unit telephone number  Request made of family/significant other to:  Remove weapons (e.g., guns, rifles, knives), all items previously/currently identified as safety concern.    Remove drugs/medications (over-the-counter, prescriptions, illicit drugs), all items previously/currently identified as a safety concern.  The family member/significant other verbalizes understanding of the suicide prevention education information provided.  The family member/significant other agrees to remove the items of safety concern listed above.  [name] L 05/13/2012, 3:44 PM

## 2012-05-13 NOTE — Progress Notes (Signed)
[facility] Group Notes:  (Counselor/Nursing/MHT/Case Management/Adjunct)  05/13/2012 10:24 PM  Type of Therapy:  Psychoeducational Skills  Participation Level:  Minimal  Participation Quality:  Attentive  Affect:  Depressed  Cognitive:  Appropriate  Insight:  Limited  Engagement in Group:  Limited  Engagement in Therapy:  Limited  Modes of Intervention:  Education  Summary of Progress/Problems: The patient verbalized in group that he had a good day. He states that he continues to progress towards his goals. His goal for tomorrow is to attend more groups. He did little to elaborate any further.    Hazle Coca S 05/13/2012, 10:24 PM

## 2012-05-13 NOTE — Progress Notes (Signed)
Patient ID: [name], male   DOB: 1980/12/20, 31 y.o.   MRN: [id number]  Problem: Bipolar Disorder, ADHD, Anxiety, Depression  D: Patient withdrawn and isolative to his room.   A: Monitor patient Q 15 minutes for safety, encourage staff/peer interaction and administer medications as ordered by MD.  R: Patient endorses depression, little interaction with staff. No distress noted at this time. Pt deneis SI or plans to harm himself.

## 2012-05-13 NOTE — Progress Notes (Signed)
Patient ID: [name], male   DOB: 10-31-1980, 31 y.o.   MRN: [id number]   [facility] Group Notes:  (Counselor/Nursing/MHT/Case Management/Adjunct)  05/13/2012 1:15 PM  Type of Therapy:  Group Therapy, Dance/Movement Therapy   Participation Level:  Minimal  Participation Quality:  Appropriate  Affect:  Appropriate  Cognitive:  Appropriate  Insight:  Limited  Engagement in Group:  Limited  Engagement in Therapy:  Limited  Modes of Intervention:  Clarification, Problem-solving, Role-play, Socialization and Support  Summary of Progress/Problems: Therapist and group members discussed self-sabotaging behaviors as well as healthy coping skills. Group members shared one positive thing to be thankful for today and how it is important to focus on the present. Pt shared that he is thankful that the sun is out today.     Cassidi Long 05/13/2012. 2:53 PM

## 2012-05-13 NOTE — Progress Notes (Signed)
Psychoeducational Group Note  Date:  05/13/2012 Time:  1515  Group Topic/Focus:  Coping With Mental Health Crisis:   The purpose of this group is to help patients identify strategies for coping with mental health crisis.  Group discusses possible causes of crisis and ways to manage them effectively.  Participation Level:  Did Not Attend  Participation Quality:    Affect:    Cognitive:    Insight:    Engagement in Group:    Additional Comments: asleep in bed  [place], Chauntelle Azpeitia Celcia 05/13/2012, 4:49 PM
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
## 2012-05-13 NOTE — Progress Notes (Signed)
Patient ID: [name], male   DOB: 01/03/1981, 31 y.o.   MRN: [id number]  Problem: Bipolar, ADHD, Anxiety, Depression  D: Pt isolative to his room with no interaction. Pt endorses depression.  A: Monitor patient Q 15 minutes for safety, encourage staff/peer interaction, administer medications as ordered by MD.  R: Pt did not attend group session, but remains isolative to his room. Pt denies SI or plans to harm himself at this time.

## 2012-05-14 DIAGNOSIS — R9431 Abnormal electrocardiogram [ECG] [EKG]: Secondary | ICD-10-CM | POA: Diagnosis present

## 2012-05-14 MED ORDER — ACETAMINOPHEN 325 MG PO TABS
650.0000 mg | ORAL_TABLET | Freq: Four times a day (QID) | ORAL | Status: DC | PRN
  Administered 2012-05-14: 650 mg via ORAL

## 2012-05-14 NOTE — Progress Notes (Signed)
Patient ID: [name], male   DOB: [birth year], 31 y.o.   MRN: [id number]   [facility] Group Notes:  (Counselor/Nursing/MHT/Case Management/Adjunct)  05/14/2012 1:15 PM  Type of Therapy:  Group Therapy, Dance/Movement Therapy   Participation Level:  Minimal  Participation Quality:  Appropriate  Affect:  Appropriate  Cognitive:  Appropriate  Insight:  None  Engagement in Group:  None  Engagement in Therapy:  None  Modes of Intervention:  Clarification, Problem-solving, Role-play, Socialization and Support  Summary of Progress/Problems: Therapist and group members discussed healthy support systems and the importance of figuring out who we are and what we need. Group members shared experiences and ways they feel supported. During the check-in pt shared that he was feeling like the color red because that color meant excitement.     Cassidi Long 05/14/2012. 2:40 PM

## 2012-05-14 NOTE — Progress Notes (Signed)
Patient ID: [name], male   DOB: 06-18-1981, 31 y.o.   MRN: [id number]  Pt did not attend aftercare planning group.

## 2012-05-14 NOTE — Progress Notes (Signed)
Psychoeducational Group Note  Date:  05/14/2012 Time:  1515  Group Topic/Focus:  Crisis Planning:   The purpose of this group is to help patients create a crisis plan for use upon discharge or in the future, as needed.  Participation Level:  Active  Participation Quality:  Appropriate  Affect:  Appropriate  Cognitive:  Appropriate  Insight:  Good  Engagement in Group:  Good  Additional Comments:  None   [name], Genia Plants 05/14/2012, 7:22 PM

## 2012-05-14 NOTE — Progress Notes (Signed)
Patient ID: [name], male   DOB: [birth year], 31 y.o.   MRN: [id number]  Patient asleep; no s/s of distress noted at this time. Respirations regular and unlabored.

## 2012-05-14 NOTE — Progress Notes (Signed)
[name] ( as he prefers to be called) is status quo today. He asks this nurse to call him "[name]",, takes his medications as ordered and is attending his groups as scheduled.    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 He denies SI, he shaved and took a shower today...looks slightly brighter today and was attentive and engaged in the group discussion during Life SKills group this morning.    R Safety is in place and POC cont with therapeutic relationship fostered. PD RN [facility]

## 2012-05-14 NOTE — Progress Notes (Signed)
[facility] In Patient Progress Note 05/14/2012 12:42 PM [name] [facility] Jun 10, 1981 [id number] Hospital day #:3 Diagnosis:  Axis I: Axis I: Bipolar Disorder Type II.  Attention Deficit/Hyperactivity Disorder.  ADL's:  Intact Sleep:  Moderately improved Appetite:ok  Groups:Good  Subjective:  Met with the patient [name] today who informs me that he is doing a little better and has no new complaints.  We did discuss his medication and his ADD symptoms as well as his symptoms of UC.  He sees a psychiatrist as an out patient.   height is 6' 0.5" (1.842 m) and weight is 79.379 kg (175 lb). His oral temperature is 98 F (36.7 C). His blood pressure is 124/85 and his pulse is 85. His respiration is 16.   Objective:  Patient is active in the unit miliue Sx of withdrawal:None  Ros: ROS: Constitional: WDWN Adult in NAD COR: negative for SOB, CP, cough, wheezing GI: Negative for Nausea, vomiting, diarrhea, constipation, + for some lower abdominal discomfort due to his UC. Neuro: negative for dizziness, blurred vision, headaches, numbness or tingling Ortho: negative for limb pain, swelling, change in ambulatory status.  Mental Status Exam Level of Consciousness: awake Orientation: x 3 General Appearance: casual dress Behavior:  cooperative Eye Contact:  good Motor Behavior:  normal Speech:  Clear and goal directed Mood:  Depressed rates it as a 2  Suicidal Ideation: No suicidal ideation, no plan, no intent, no means. Homicidal Ideation:  No homicidal ideation, no plan, no intent, no means.  Affect:  anxious Anxiety Level: significant to severe anxiety  Thought Process:  linear Thought Content:  normal Perception:  intact Judgment:  fair Insight:  fair Cognition: above average  Sleep:  Number of Hours: 6.25   Lab Results:  Results for orders placed during the hospital encounter of 05/11/12 (from the past 48 hour(s))  TSH     Status: Normal   Collection Time   05/12/12  7:30 PM     Component Value Range Comment   TSH 0.783  0.350 - 4.500 uIU/mL   T3, FREE     Status: Normal   Collection Time   05/12/12  7:30 PM      Component Value Range Comment   T3, Free 3.7  2.3 - 4.2 pg/mL   T4, FREE     Status: Normal   Collection Time   05/12/12  7:30 PM      Component Value Range Comment   Free T4 1.75  0.80 - 1.80 ng/dL    Labs are reviewed. Physical Findings: AIMS: normal x 3 days CIWA:    COWS:     Medication:  . azaTHIOprine  150 mg Oral Daily  . citalopram  20 mg Oral Daily  . lamoTRIgine  200 mg Oral Daily  . multivitamin with minerals  1 tablet Oral Daily  . sulfaSALAzine  1,000 mg Oral BID  . ziprasidone  40 mg Oral Q breakfast  . ziprasidone  80 mg Oral QPM    Treatment Plan Summary: 1. Admit for crisis management and stabilization. 2. Medication management to reduce current symptoms to base line and improve the patient's overall level of functioning 3. Treat health problems as indicated. 4. Develop treatment plan to decrease risk of relapse upon discharge and the need for readmission. 5. Psycho-social education regarding relapse prevention and self care. 6. Health care follow up as needed for medical problems. 7. Restart home medications where appropriate.   Plan: 1. Will continue the patient on the medication  Celexa at 20 mgs po qam for depression and anxiety.  2. Will continue the patient on the medication Lamictal at 200 mgs po qam to provide mood stabilization.  3. Will continue the patient on the medication Geodon at 40 mgs po q am and 80 mgs po q pm with food also for mood stabilization.  4. Will continue the medication Trazodone but at a reduced dosage of 200 mgs po qhs for sleep instead of 300 mgs as prescribed by his outpatient Psychiatrist.  5. Will not restart the medication Adderall at 20 mgs po qid at this time as prescribed by his outpatient Psychiatrist.  6. Will continue the medication Xanax but at a reduced dosage of 1 mg po TID -  prn for anxiety rather than 2 mgs po TID as prescribed by his outpatient Psychiatrist.  7. Will continue the patient on his non-psychiatric medications as listed above.  8. Laboratory Studies reviewed.  9. Will continue to monitor.  10. EKG is abnormal, with increased LVH, as well as arrythmia. Will repeat to further evaluate. Rona Ravens. [name] PAC 05/14/2012, 12:42 PM

## 2012-05-14 NOTE — Progress Notes (Signed)
[facility] Group Notes:  (Counselor/Nursing/MHT/Case Management/Adjunct)  05/14/2012 11:12 PM  Type of Therapy:  Psychoeducational Skills  Participation Level:  Minimal  Participation Quality:  Attentive  Affect:  Depressed  Cognitive:  Appropriate  Insight:  Limited  Engagement in Group:  Limited  Engagement in Therapy:  Limited  Modes of Intervention:  Education  Summary of Progress/Problems: The patient verbalized that he had a good day and that he was in the process of completing his worksheets. His goal for tomorrow is to complete the worksheets and get discharged.    Hazle Coca S 05/14/2012, 11:12 PM

## 2012-05-15 MED ORDER — AMPHETAMINE-DEXTROAMPHET ER 30 MG PO CP24: 30.0000 mg | ORAL_CAPSULE | Freq: Every day | ORAL | Status: DC

## 2012-05-15 MED ORDER — TRAZODONE HCL 100 MG PO TABS: 200.0000 mg | ORAL_TABLET | Freq: Every evening | ORAL | Status: DC | PRN

## 2012-05-15 MED ORDER — CITALOPRAM HYDROBROMIDE 20 MG PO TABS: 20.0000 mg | ORAL_TABLET | Freq: Every day | ORAL | Status: DC

## 2012-05-15 MED ORDER — LAMOTRIGINE 200 MG PO TABS: 200.0000 mg | ORAL_TABLET | Freq: Every day | ORAL | Status: DC

## 2012-05-15 MED ORDER — DICYCLOMINE HCL 20 MG PO TABS: 20.0000 mg | ORAL_TABLET | Freq: Four times a day (QID) | ORAL | Status: DC | PRN

## 2012-05-15 MED ORDER — AZATHIOPRINE 50 MG PO TABS: 150.0000 mg | ORAL_TABLET | Freq: Every day | ORAL | Status: DC

## 2012-05-15 MED ORDER — SULFASALAZINE 500 MG PO TABS: 1000.0000 mg | ORAL_TABLET | Freq: Two times a day (BID) | ORAL | Status: DC

## 2012-05-15 MED ORDER — ALPRAZOLAM 1 MG PO TABS: 1.0000 mg | ORAL_TABLET | Freq: Three times a day (TID) | ORAL | Status: DC | PRN

## 2012-05-15 MED ORDER — ADULT MULTIVITAMIN W/MINERALS CH: 1.0000 | ORAL_TABLET | Freq: Every day | ORAL | Status: AC

## 2012-05-15 MED ORDER — AMPHETAMINE-DEXTROAMPHET ER 10 MG PO CP24: 30.0000 mg | ORAL_CAPSULE | Freq: Every day | ORAL | Status: DC

## 2012-05-15 MED ORDER — MESALAMINE 1000 MG RE SUPP: 1000.0000 mg | Freq: Every evening | RECTAL | Status: DC | PRN

## 2012-05-15 MED ORDER — ZIPRASIDONE HCL 40 MG PO CAPS: ORAL_CAPSULE | ORAL | Status: DC

## 2012-05-15 NOTE — Progress Notes (Signed)
Discharge instructions reviewed with pt.  Pt. Verbalized an understanding of discharge instructions.  Pt. Denies SI/HI. Encouragement and support given.  Pt. Receptive.

## 2012-05-15 NOTE — Discharge Summary (Signed)
Physician Discharge Summary Note  Patient:  [name] is an 31 y.o., male MRN:  [id number] DOB:  01-01-1981 Patient phone:  [phone] (home)  Patient address:   [address] [address] [place] [address]   Date of Admission:  05/11/2012 Date of Discharge:  05/15/2012 Discharge Diagnoses: Principal Problem:  *Bipolar II disorder Active Problems:  Attention deficit hyperactivity disorder (ADHD)  EKG, abnormal  Axis Diagnosis:  AXIS I:   Bipolar Disorder Type II.    Attention Deficit/Hyperactivity Disorder.  AXIS II:   Deferred. AXIS III:   1.  Inflammatory Bowel Disease. AXIS IV:   History of Mental Illness.  History of Serious Non-psychiatric health issues.  One prior psychiatric hospitalization.     AXIS V:   GAF at time of admission approximately 45.  GAF at time of discharge approximately 70.  Level of Care:  Inpatient Hospitalization.  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 States he did not mean literally that he wanted to die but that his pain at times can be unbearable. 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 He currently denies AH/VH/ SI/HI.   Hospital Course:   The patient attended treatment team meeting this am and met with treatment team members. The patient's symptoms, treatment plan and response to treatment was discussed. The patient endorsed that their symptoms have improved. The patient also stated that they felt stable for discharge.  They reported that from this hospital stay they had learned many coping skills.  In other to maintain their  psychiatric stability, they will continue psychiatric care on an outpatient basis. They will follow-up as outlined below.  In addition they were instructed  to take all your medications as prescribed by their mental healthcare provider and to report any adverse effects and or reactions from your medicines to their outpatient provider promptly.  The patient is also instructed and cautioned to not engage in alcohol and or illegal drug use while on prescription medicines.  In the event of worsening symptoms the patient is instructed to call the crisis hotline, 911 and or go to the nearest ED for appropriate evaluation and treatment of symptoms.   Also while a patient in this hospital, the patient received medication management for his psychiatric symptoms. They were ordered and received as outlined below:    Medication List     As of 05/15/2012  2:23 PM    STOP taking these medications         amphetamine-dextroamphetamine 20 MG tablet   Commonly known as: ADDERALL      zolpidem 10 MG tablet   Commonly known as: AMBIEN      TAKE these medications      Indication    ALPRAZolam 1 MG tablet   Commonly known as: XANAX   Take 1 tablet (1 mg total) by mouth 3 (three) times daily as needed for anxiety.       amphetamine-dextroamphetamine 30 MG 24 hr capsule   Commonly known as: ADDERALL XR   Take 1 capsule (30 mg  total) by mouth daily. Please take on place of regular Adderall for ADHD symptoms.       azaTHIOprine 50 MG tablet   Commonly known as: IMURAN   Take 3 tablets (150 mg total) by mouth daily. For IBD.       citalopram 20 MG tablet   Commonly known as: CELEXA   Take 1 tablet (20 mg total) by mouth daily. For depression and anxiety.       dicyclomine 20 MG tablet   Commonly known as: BENTYL   Take 1 tablet (20 mg total) by mouth every 6 (six) hours as needed (Stomach spasms.).       lamoTRIgine 200 MG tablet   Commonly known as: LAMICTAL   Take 1 tablet (200 mg total) by mouth  daily. For mood stabilization.       mesalamine 1000 MG suppository   Commonly known as: CANASA   Place 1 suppository (1,000 mg total) rectally at bedtime as needed (IBD.).       multivitamin with minerals Tabs   Take 1 tablet by mouth daily. As a nutritional supplement.       sulfaSALAzine 500 MG tablet   Commonly known as: AZULFIDINE   Take 2 tablets (1,000 mg total) by mouth 2 (two) times daily. For IBD.       traZODone 100 MG tablet   Commonly known as: DESYREL   Take 2 tablets (200 mg total) by mouth at bedtime as needed for sleep.       ziprasidone 40 MG capsule   Commonly known as: GEODON   Please take 1 Capsule in the morning and 2 Capsules evening with food for mood stabilization.        They were also enrolled in group counseling sessions and activities in which they participated actively.       Follow-up Information    Follow up with Dr Tomasa Rand. On 05/19/2012. (3:30 Friday)    Contact information:   [facility]  [address]  [phone]      Follow up with Dr Farrel Demark. On 05/29/2012. (10:00)    Contact information:   Same place        Upon discharge, patient adamantly denies suicidal, homicidal ideations, auditory, visual hallucinations and or delusional thinking. They [facility] with all personal belongings via personal transportation in no apparent distress.  Consults:  Please see electronic medical record for details.  Significant Diagnostic Studies:  Please see electronic medical record for details.  Discharge Vitals:   Blood pressure 127/88, pulse 111, temperature 98.2 F (36.8 C), temperature source Oral, resp. rate 18, height 6' 0.5" (1.842 m), weight 79.379 kg (175 lb)..  Mental Status Exam: Demographic Factors:  Male, Adolescent or young adult and Caucasian  Mental Status Per Nursing Assessment::   On Admission:   (Passive SI but not actually kill himself per pt) At Time of Discharge:  Time was spent today discussing with  the patient his current symptoms. The patient reports that he is sleeping well at night and reports his appetite is good today.  He denies any significant feelings of sadness, anhedonia or depressed mood and adamantly denies any suicidal or homicidal ideations.  He also denies any auditory or visual hallucinations or delusional thinking.  Mr. Brougham reports moderate anxiety symptoms but states this is at their baseline.  The patient also denies any current medication related side effects.   Time was spent discussing with the patient the need for him to  consider changing the medication Adderall to a sustained release formulation and to reduce the dosage to avoid worsening his IBD symptoms as well as to minimize addiction.  The patient agreed to a trial of Adderall XR and this will be ordered today.  The patient states that he feels ready for discharge and this will be ordered.  Current Mental Status Per Physician:  Diagnosis:  Axis I: Bipolar Disorder Type II.  Attention Deficit/Hyperactivity Disorder.   The patient was seen today and reports the following:   ADL's: Intact.  Sleep: The patient reports to sleeping well last night.  Appetite: The patient reports that his appetite is good today.   Mild>(1-10) >Severe  Hopelessness (1-10): 0  Depression (1-10): 0  Anxiety (1-10): 6-7   Suicidal Ideation: The patient adamantly denies any suicidal or homicidal ideations. Plan: No  Intent: No  Means: No   Homicidal Ideation: The patient adamantly denies any homicidal ideations today.  Plan: No  Intent: No.  Means: No   General Appearance/Behavior: The patient was friendly and cooperative today with this provider.  Eye Contact: Good.  Speech: Appropriate in rate and volume with no pressuring of speech noted today.  Motor Behavior: wnl.  Level of Consciousness: Alert and Oriented x 3.  Mental Status: Alert and Oriented x 3.  Mood: Appears essentially euthymic today.  Affect: Appears  bright and full.  Anxiety Level: Moderate anxiety reported today which the patient states is his baseline.  Thought Process: The patient denies any auditory or visual hallucinations today or delusional thinking.  Thought Content: The patient denies any auditory or visual hallucinations today or delusional thinking.  Perception: The patient denies any auditory or visual hallucinations today or delusional thinking.  Judgment: Fair to Good.  Insight: Fair to Good  Cognition: Oriented to person, place and time.   Loss Factors: Decrease in vocational status and Decline in physical health  Historical Factors: History of Mental Illness.  History of Serious Non-psychiatric health issues.  One prior psychiatric hospitalization.    Risk Reduction Factors:   The patient is intelligent.  Good Family Support.  Good Access to Healthcare.    Continued Clinical Symptoms:  Severe Anxiety and/or Agitation Chronic Pain More than one psychiatric diagnosis Previous Psychiatric Diagnoses and Treatments Medical Diagnoses and Treatments/Surgeries  Discharge Diagnoses: AXIS I:   Bipolar Disorder Type II.    Attention Deficit/Hyperactivity Disorder.  AXIS II:   Deferred. AXIS III:   1.  Inflammatory Bowel Disease. AXIS IV:   History of Mental Illness.  History of Serious Non-psychiatric health issues.  One prior psychiatric hospitalization.     AXIS V:   GAF at time of admission approximately 45.  GAF at time of discharge approximately 70.  Cognitive Features That Contribute To Risk:  None Noted.   Current Medications:  .  azaTHIOprine  150 mg  Oral  Daily  .  citalopram  20 mg  Oral  Daily  .  lamoTRIgine  200 mg  Oral  Daily  .  multivitamin with minerals  1 tablet  Oral  Daily  .  sulfaSALAzine  1,000 mg  Oral  BID  .  trazodone  200 mg  Oral  QHS  .  ziprasidone  40 mg  Oral  Q breakfast  .  ziprasidone  80 mg  Oral  QPM   Review of Systems:  Neurological: No headaches, seizures or dizziness  reported.  G.I.: The patient reports difficulty with IBD with diarrhea today.  Musculoskeletal:  The patient denies any musculoskeletal related issues.   Time was spent today discussing with the patient his current symptoms. The patient reports that he is sleeping well at night and reports his appetite is good today.  He denies any significant feelings of sadness, anhedonia or depressed mood and adamantly denies any suicidal or homicidal ideations.  He also denies any auditory or visual hallucinations or delusional thinking.  Mr. Domanski reports moderate anxiety symptoms but states this is at their baseline.  The patient also denies any current medication related side effects.   Time was spent discussing with the patient the need for him to consider changing the medication Adderall to a sustained release formulation and to reduce the dosage to avoid worsening his IBD symptoms as well as to minimize addiction.  The patient agreed to a trial of Adderall XR and this will be ordered today.  The patient states that he feels ready for discharge and this will be ordered.  Treatment Plan Summary:  1. Daily contact with patient to assess and evaluate symptoms and progress in treatment.  2. Medication management  3. The patient will deny suicidal ideations or homicidal ideations for 48 hours prior to discharge and have a depression and anxiety rating of 3 or less. The patient will also deny any auditory or visual hallucinations or delusional thinking.  4. The patient will deny any symptoms of substance withdrawal at time of discharge.   Plan:  1. Will continue the patient on the medication Celexa at 20 mgs po qam for depression and anxiety.  2. Will continue the patient on the medication Lamictal at 200 mgs po qam to provide mood stabilization.  3. Will continue the patient on the medication Geodon at 40 mgs po q am and 80 mgs po q pm with food also for mood stabilization.  4. Will continue the medication  Trazodone at 200 mgs po qhs for sleep.  5. Will start the medication Adderall XR at 30 mgs po q am instead of Adderall 20 mgs po qid for ADHD.  6. Will continue the medication Xanax 1 mg po TID - prn for anxiety rather than 2 mgs po TID as prescribed by his outpatient Psychiatrist.  7. Will continue the patient on his non-psychiatric medications as listed above.  8. Laboratory Studies reviewed.  9. Will continue to monitor.  10. The patient will be discharged today as requested to outpatient follow up.  Suicide Risk:  Minimal: No identifiable suicidal ideation.  Patients presenting with no risk factors but with morbid ruminations; may be classified as minimal risk based on the severity of the depressive symptoms  Plan Of Care/Follow-up recommendations:  Activity:  As tolerated. Diet:  Continue your regular diet for your IBD. Other:  Please take all medications only as directed and keep all scheduled follow up appointments.  Please return to your local Emergency Room should you have any thoughts of harming yourself or others.  Discharge destination:  Home  Is patient on multiple antipsychotic therapies at discharge:  No  Has Patient had three or more failed trials of antipsychotic monotherapy by history: N/A Recommended Plan for Multiple Antipsychotic Therapies: N/A Discharge Orders    Future Orders Please Complete By Expires   Diet - low sodium heart healthy      Increase activity slowly      Discharge instructions      Comments:   Please take all medications only as directed and keep all scheduled follow up appointments.  Please  return to your local Emergency Room should you have any thoughts of harming yourself or others.       Medication List     As of 05/15/2012  2:23 PM    STOP taking these medications         amphetamine-dextroamphetamine 20 MG tablet   Commonly known as: ADDERALL      zolpidem 10 MG tablet   Commonly known as: AMBIEN      TAKE these medications       Indication    ALPRAZolam 1 MG tablet   Commonly known as: XANAX   Take 1 tablet (1 mg total) by mouth 3 (three) times daily as needed for anxiety.       amphetamine-dextroamphetamine 30 MG 24 hr capsule   Commonly known as: ADDERALL XR   Take 1 capsule (30 mg total) by mouth daily. Please take on place of regular Adderall for ADHD symptoms.       azaTHIOprine 50 MG tablet   Commonly known as: IMURAN   Take 3 tablets (150 mg total) by mouth daily. For IBD.       citalopram 20 MG tablet   Commonly known as: CELEXA   Take 1 tablet (20 mg total) by mouth daily. For depression and anxiety.       dicyclomine 20 MG tablet   Commonly known as: BENTYL   Take 1 tablet (20 mg total) by mouth every 6 (six) hours as needed (Stomach spasms.).       lamoTRIgine 200 MG tablet   Commonly known as: LAMICTAL   Take 1 tablet (200 mg total) by mouth daily. For mood stabilization.       mesalamine 1000 MG suppository   Commonly known as: CANASA   Place 1 suppository (1,000 mg total) rectally at bedtime as needed (IBD.).       multivitamin with minerals Tabs   Take 1 tablet by mouth daily. As a nutritional supplement.       sulfaSALAzine 500 MG tablet   Commonly known as: AZULFIDINE   Take 2 tablets (1,000 mg total) by mouth 2 (two) times daily. For IBD.       traZODone 100 MG tablet   Commonly known as: DESYREL   Take 2 tablets (200 mg total) by mouth at bedtime as needed for sleep.       ziprasidone 40 MG capsule   Commonly known as: GEODON   Please take 1 Capsule in the morning and 2 Capsules evening with food for mood stabilization.            Follow-up Information    Follow up with Dr Tomasa Rand. On 05/19/2012. (3:30 Friday)    Contact information:   [facility]  [address]  [phone]      Follow up with Dr Farrel Demark. On 05/29/2012. (10:00)    Contact information:   Same place        Follow-up recommendations:   Activities: Resume typical  activities Diet: Resume typical diet Other: Follow up with outpatient provider and report any side effects to out patient prescriber.  Comments:  Take all your medications as prescribed by your mental healthcare provider. Report any adverse effects and or reactions from your medicines to your outpatient provider promptly. Patient is instructed and cautioned to not engage in alcohol and or illegal drug use while on prescription medicines. In the event of worsening symptoms, patient is instructed to call the crisis hotline, 911 and or go  to the nearest ED for appropriate evaluation and treatment of symptoms. Follow-up with your primary care provider for your other medical issues, concerns and or health care needs.  Signed: Franchot Gallo 05/15/2012 2:23 PM

## 2012-05-15 NOTE — Progress Notes (Signed)
[facility] Case Management Discharge Plan:  Will you be returning to the same living situation after discharge: Yes,  home At discharge, do you have transportation home?:Yes,  mother Do you have the ability to pay for your medications:Yes,  insurance  Interagency Information:     Release of information consent forms completed and in the chart;  Patient's signature needed at discharge.  Patient to Follow up at:  Follow-up Information    Follow up with Dr Tomasa Rand. On 05/19/2012. (3:30 Friday)    Contact information:   [facility]  [address]  [phone]      Follow up with Dr Farrel Demark. On 05/29/2012. (10:00)    Contact information:   Same place         Patient denies SI/HI:   Yes,  yes    Safety Planning and Suicide Prevention discussed:  Yes,  yes  Barrier to discharge identified:No.  Summary and Recommendations:   [name] 05/15/2012, 11:52 AM

## 2012-05-15 NOTE — Progress Notes (Signed)
Psychoeducational Group Note  Date:  05/15/2012 Time:  1100  Group Topic/Focus:  Self Care:   The focus of this group is to help patients understand the importance of self-care in order to improve or restore emotional, physical, spiritual, interpersonal, and financial health.  Participation Level:  Active  Participation Quality:  Attentive  Affect:  Appropriate  Cognitive:  Appropriate  Insight:  Good  Engagement in Group:  Good  Additional Comments:  Pt participated in group.  Marquis Lunch, Tujuana Kilmartin 05/15/2012, 1:46 PM

## 2012-05-15 NOTE — [facility] Suicide Risk Assessment (Signed)
Suicide Risk Assessment  Discharge Assessment     Demographic Factors:  Male, Adolescent or young adult and Caucasian  Mental Status Per Nursing Assessment::   On Admission:   (Passive SI but not actually kill himself per pt) At Time of Discharge:  Time was spent today discussing with the patient his current symptoms. The patient reports that he is sleeping well at night and reports his appetite is good today.  He denies any significant feelings of sadness, anhedonia or depressed mood and adamantly denies any suicidal or homicidal ideations.  He also denies any auditory or visual hallucinations or delusional thinking.  Mr. Nigrelli reports moderate anxiety symptoms but states this is at their baseline.  The patient also denies any current medication related side effects.   Time was spent discussing with the patient the need for him to consider changing the medication Adderall to a sustained release formulation and to reduce the dosage to avoid worsening his IBD symptoms as well as to minimize addiction.  The patient agreed to a trial of Adderall XR and this will be ordered today.  The patient states that he feels ready for discharge and this will be ordered.  Current Mental Status Per Physician:  Diagnosis:  Axis I: Bipolar Disorder Type II.  Attention Deficit/Hyperactivity Disorder.   The patient was seen today and reports the following:   ADL's: Intact.  Sleep: The patient reports to sleeping well last night.  Appetite: The patient reports that his appetite is good today.   Mild>(1-10) >Severe  Hopelessness (1-10): 0  Depression (1-10): 0  Anxiety (1-10): 6-7   Suicidal Ideation: The patient adamantly denies any suicidal or homicidal ideations. Plan: No  Intent: No  Means: No   Homicidal Ideation: The patient adamantly denies any homicidal ideations today.  Plan: No  Intent: No.  Means: No   General Appearance/Behavior: The patient was friendly and cooperative today with  this provider.  Eye Contact: Good.  Speech: Appropriate in rate and volume with no pressuring of speech noted today.  Motor Behavior: wnl.  Level of Consciousness: Alert and Oriented x 3.  Mental Status: Alert and Oriented x 3.  Mood: Appears essentially euthymic today.  Affect: Appears bright and full.  Anxiety Level: Moderate anxiety reported today which the patient states is his baseline.  Thought Process: The patient denies any auditory or visual hallucinations today or delusional thinking.  Thought Content: The patient denies any auditory or visual hallucinations today or delusional thinking.  Perception: The patient denies any auditory or visual hallucinations today or delusional thinking.  Judgment: Fair to Good.  Insight: Fair to Good  Cognition: Oriented to person, place and time.   Loss Factors: Decrease in vocational status and Decline in physical health  Historical Factors: History of Mental Illness.  History of Serious Non-psychiatric health issues.  One prior psychiatric hospitalization.    Risk Reduction Factors:   The patient is intelligent.  Good Family Support.  Good Access to Healthcare.    Continued Clinical Symptoms:  Severe Anxiety and/or Agitation Chronic Pain More than one psychiatric diagnosis Previous Psychiatric Diagnoses and Treatments Medical Diagnoses and Treatments/Surgeries  Discharge Diagnoses:   AXIS I:   Bipolar Disorder Type II.    Attention Deficit/Hyperactivity Disorder.  AXIS II:   Deferred. AXIS III:   1.  Inflammatory Bowel Disease. AXIS IV:   History of Mental Illness.  History of Serious Non-psychiatric health issues.  One prior psychiatric hospitalization.     AXIS V:  GAF at time of admission approximately 45.  GAF at time of discharge approximately 70.  Cognitive Features That Contribute To Risk:  None Noted.   Current Medications:  .  azaTHIOprine  150 mg  Oral  Daily  .  citalopram  20 mg  Oral  Daily  .  lamoTRIgine  200  mg  Oral  Daily  .  multivitamin with minerals  1 tablet  Oral  Daily  .  sulfaSALAzine  1,000 mg  Oral  BID  .  trazodone  200 mg  Oral  QHS  .  ziprasidone  40 mg  Oral  Q breakfast  .  ziprasidone  80 mg  Oral  QPM   Review of Systems:  Neurological: No headaches, seizures or dizziness reported.  G.I.: The patient reports difficulty with IBD with diarrhea today.  Musculoskeletal: The patient denies any musculoskeletal related issues.   Time was spent today discussing with the patient his current symptoms. The patient reports that he is sleeping well at night and reports his appetite is good today.  He denies any significant feelings of sadness, anhedonia or depressed mood and adamantly denies any suicidal or homicidal ideations.  He also denies any auditory or visual hallucinations or delusional thinking.  Mr. Kotch reports moderate anxiety symptoms but states this is at their baseline.  The patient also denies any current medication related side effects.   Time was spent discussing with the patient the need for him to consider changing the medication Adderall to a sustained release formulation and to reduce the dosage to avoid worsening his IBD symptoms as well as to minimize addiction.  The patient agreed to a trial of Adderall XR and this will be ordered today.  The patient states that he feels ready for discharge and this will be ordered.  Treatment Plan Summary:  1. Daily contact with patient to assess and evaluate symptoms and progress in treatment.  2. Medication management  3. The patient will deny suicidal ideations or homicidal ideations for 48 hours prior to discharge and have a depression and anxiety rating of 3 or less. The patient will also deny any auditory or visual hallucinations or delusional thinking.  4. The patient will deny any symptoms of substance withdrawal at time of discharge.   Plan:  1. Will continue the patient on the medication Celexa at 20 mgs po qam for  depression and anxiety.  2. Will continue the patient on the medication Lamictal at 200 mgs po qam to provide mood stabilization.  3. Will continue the patient on the medication Geodon at 40 mgs po q am and 80 mgs po q pm with food also for mood stabilization.  4. Will continue the medication Trazodone at 200 mgs po qhs for sleep.  5. Will start the medication Adderall XR at 30 mgs po q am instead of Adderall 20 mgs po qid for ADHD.  6. Will continue the medication Xanax 1 mg po TID - prn for anxiety rather than 2 mgs po TID as prescribed by his outpatient Psychiatrist.  7. Will continue the patient on his non-psychiatric medications as listed above.  8. Laboratory Studies reviewed.  9. Will continue to monitor.  10. The patient will be discharged today as requested to outpatient follow up.  Suicide Risk:  Minimal: No identifiable suicidal ideation.  Patients presenting with no risk factors but with morbid ruminations; may be classified as minimal risk based on the severity of the depressive symptoms  Plan  Of Care/Follow-up recommendations:  Activity:  As tolerated. Diet:  Continue your regular diet for your IBD. Other:  Please take all medications only as directed and keep all scheduled follow up appointments.  Please return to your local Emergency Room should you have any thoughts of harming yourself or others.  Antanasia Kaczynski 05/15/2012, 12:42 PM

## 2012-05-15 NOTE — Progress Notes (Signed)
Group Note  Date:  05/15/2012 Time:  1:15  Group Topic/Focus:  Overcoming obstacles to wellness  Additional Comments:  Did not attend group.  Sophiya Morello S 05/15/2012, 2:54 PM

## 2012-05-17 NOTE — Progress Notes (Signed)
Patient Discharge Instructions:  After Visit Summary (AVS):   Faxed to:  05/17/2012 Psychiatric Admission Assessment Note:   Faxed to:  05/17/2012 Suicide Risk Assessment - Discharge Assessment:   Faxed to:  05/17/2012 Faxed/Sent to the Next Level Care provider:  05/17/2012  Faxed to [facility] Dr. Tomasa Rand @ [phone]  Heloise Purpura Eduard Clos, 05/17/2012, 2:33 PM

## 2012-05-25 ENCOUNTER — Encounter: Payer: Self-pay | Admitting: Gastroenterology

## 2012-07-17 ENCOUNTER — Telehealth: Payer: Self-pay | Admitting: Gastroenterology

## 2012-07-17 NOTE — Telephone Encounter (Signed)
Pt scheduled for recall colon and previsit. Pt aware of appt dates and times.

## 2012-07-19 ENCOUNTER — Ambulatory Visit (AMBULATORY_SURGERY_CENTER): Payer: BC Managed Care – PPO

## 2012-07-19 ENCOUNTER — Encounter: Payer: Self-pay | Admitting: Gastroenterology

## 2012-07-19 VITALS — Ht 74.0 in | Wt 181.0 lb

## 2012-07-19 DIAGNOSIS — K5289 Other specified noninfective gastroenteritis and colitis: Secondary | ICD-10-CM

## 2012-07-19 DIAGNOSIS — K5190 Ulcerative colitis, unspecified, without complications: Secondary | ICD-10-CM

## 2012-07-27 ENCOUNTER — Ambulatory Visit (AMBULATORY_SURGERY_CENTER): Payer: BC Managed Care – PPO | Admitting: Gastroenterology

## 2012-07-27 ENCOUNTER — Encounter: Payer: Self-pay | Admitting: Gastroenterology

## 2012-07-27 VITALS — BP 101/57 | HR 77 | Temp 98.5°F | Resp 17 | Ht 74.0 in | Wt 181.0 lb

## 2012-07-27 DIAGNOSIS — K5190 Ulcerative colitis, unspecified, without complications: Secondary | ICD-10-CM

## 2012-07-27 DIAGNOSIS — K5289 Other specified noninfective gastroenteritis and colitis: Secondary | ICD-10-CM

## 2012-07-27 MED ORDER — SODIUM CHLORIDE 0.9 % IV SOLN: 500.0000 mL | INTRAVENOUS | Status: DC

## 2012-07-27 NOTE — Progress Notes (Signed)
Patient did not experience any of the following events: a burn prior to discharge; a fall within the facility; wrong site/side/patient/procedure/implant event; or a hospital transfer or hospital admission upon discharge from the facility. (G8907) Patient did not have preoperative order for IV antibiotic SSI prophylaxis. (G8918)  

## 2012-07-27 NOTE — Op Note (Signed)
[facility] [address] [place], [address]   COLONOSCOPY PROCEDURE REPORT  PATIENT: [name], [name]  MR#: [id number] BIRTHDATE: 03/25/1981 , 31  yrs. old GENDER: Male ENDOSCOPIST: Louis Meckel, MD REFERRED BY: PROCEDURE DATE:  07/27/2012 PROCEDURE:   Colonoscopy with biopsy ASA CLASS:   Class II INDICATIONS:follow up for previously diagnosed pancolitis ulcerative colitis. MEDICATIONS: MAC sedation, administered by CRNA and propofol (Diprivan) 400mg  IV  DESCRIPTION OF PROCEDURE:   After the risks benefits and alternatives of the procedure were thoroughly explained, informed consent was obtained.  A digital rectal exam revealed no abnormalities of the rectum.   The LB [id number]  endoscope was introduced through the anus and advanced to the cecum, which was identified by both the appendix and ileocecal valve. No adverse events experienced.   The quality of the prep was Suprep excellent The instrument was then slowly withdrawn as the colon was fully examined.      COLON FINDINGS: Abnormal mucosa was found in the sigmoid colon and rectum. There were mild changes consisting of areas of edematous, moderately erythematous mucosa  The colon mucosa was otherwise normal. The colon was sprayed with methylene blue.  Directed biopsies were taken in areas of abnormal uptake which were confined to the distal left colon.  The remainder of the colon was normal. Retroflexed views revealed no abnormalities. The time to cecum=5 minutes 22 seconds.  Withdrawal time=16 minutes 42 seconds.  The scope was withdrawn and the procedure completed. COMPLICATIONS: There were no complications.  ENDOSCOPIC IMPRESSION: 1.   Mild colitis confined to left colon 2.   The colon mucosa was otherwise normal  RECOMMENDATIONS: Continue current meds Colonoscopy 1 year  eSigned:  Louis Meckel, MD 07/27/2012 9:30 AM   cc: Etta Grandchild, MD   PATIENT NAME:   [name], [name] MR#: [id number]

## 2012-07-27 NOTE — Progress Notes (Signed)
Called to room to assist during endoscopic procedure.  Patient ID and intended procedure confirmed with present staff. Received instructions for my participation in the procedure from the performing physician. ewm 

## 2012-07-27 NOTE — Progress Notes (Signed)
[id number] a/ox3 pleased report to [facility]

## 2012-07-27 NOTE — Patient Instructions (Addendum)
Impressions/recommendations:  Colitis  Continue current medications.YOU HAD AN ENDOSCOPIC PROCEDURE TODAY AT THE [facility]: Refer to the procedure report that was given to you for any specific questions about what was found during the examination.  If the procedure report does not answer your questions, please call your gastroenterologist to clarify.  If you requested that your care partner not be given the details of your procedure findings, then the procedure report has been included in a sealed envelope for you to review at your convenience later.  YOU SHOULD EXPECT: Some feelings of bloating in the abdomen. Passage of more gas than usual.  Walking can help get rid of the air that was put into your GI tract during the procedure and reduce the bloating. If you had a lower endoscopy (such as a colonoscopy or flexible sigmoidoscopy) you may notice spotting of blood in your stool or on the toilet paper. If you underwent a bowel prep for your procedure, then you may not have a normal bowel movement for a few days.  DIET: Your first meal following the procedure should be a light meal and then it is ok to progress to your normal diet.  A half-sandwich or bowl of soup is an example of a good first meal.  Heavy or fried foods are harder to digest and may make you feel nauseous or bloated.  Likewise meals heavy in dairy and vegetables can cause extra gas to form and this can also increase the bloating.  Drink plenty of fluids but you should avoid alcoholic beverages for 24 hours.  ACTIVITY: Your care partner should take you home directly after the procedure.  You should plan to take it easy, moving slowly for the rest of the day.  You can resume normal activity the day after the procedure however you should NOT DRIVE or use heavy machinery for 24 hours (because of the sedation medicines used during the test).    SYMPTOMS TO REPORT IMMEDIATELY: A gastroenterologist can be reached at any hour.   During normal business hours, 8:30 AM to 5:00 PM Monday through Friday, call [phone].  After hours and on weekends, please call the GI answering service at [phone] who will take a message and have the physician on call contact you.   Following lower endoscopy (colonoscopy or flexible sigmoidoscopy):  Excessive amounts of blood in the stool  Significant tenderness or worsening of abdominal pains  Swelling of the abdomen that is new, acute  Fever of 100F or higher   FOLLOW UP: If any biopsies were taken you will be contacted by phone or by letter within the next 1-3 weeks.  Call your gastroenterologist if you have not heard about the biopsies in 3 weeks.  Our staff will call the home number listed on your records the next business day following your procedure to check on you and address any questions or concerns that you may have at that time regarding the information given to you following your procedure. This is a courtesy call and so if there is no answer at the home number and we have not heard from you through the emergency physician on call, we will assume that you have returned to your regular daily activities without incident.  SIGNATURES/CONFIDENTIALITY: You and/or your care partner have signed paperwork which will be entered into your electronic medical record.  These signatures attest to the fact that that the information above on your After Visit Summary has been reviewed and is understood.  Full responsibility of the confidentiality of this discharge information lies with you and/or your care-partner.  Repeat colonoscopy in 1 year.

## 2012-07-28 ENCOUNTER — Telehealth: Payer: Self-pay

## 2012-07-28 NOTE — Telephone Encounter (Signed)
  Follow up Call-  Call back number 07/27/2012 04/28/2011 03/19/2011  Post procedure Call Back phone  # [phone]  Permission to leave phone message Yes - -     Patient questions:  Do you have a fever, pain , or abdominal swelling? no Pain Score  0 *  Have you tolerated food without any problems? yes  Have you been able to return to your normal activities? yes  Do you have any questions about your discharge instructions: Diet   no Medications  no Follow up visit  no  Do you have questions or concerns about your Care? no  Actions: * If pain score is 4 or above: No action needed, pain <4.

## 2012-08-02 ENCOUNTER — Encounter: Payer: Self-pay | Admitting: Gastroenterology

## 2012-10-06 ENCOUNTER — Telehealth: Payer: Self-pay | Admitting: Internal Medicine

## 2012-10-06 NOTE — Telephone Encounter (Signed)
Patient Information:  Caller Name: Maxen  Phone: [phone]  Patient: [name], [name]  Gender: Male  DOB: 1981-06-30  Age: 32 Years  PCP: Sanda Linger (Adults only)  Office Follow Up:  Does the office need to follow up with this patient?: No  Instructions For The Office: N/A   Symptoms  Reason For Call & Symptoms: Patient calling, has blood in semen.  Has happened x 2.  The first occurence was 5 days ago and had a "significant" amount of  red blood and then the second occurence was a lessened amount.  No blood in his urine.  Has soreness in his scrotum.  No known injury or swelling.  Reviewed Health History In EMR: Yes  Reviewed Medications In EMR: Yes  Reviewed Allergies In EMR: Yes  Reviewed Surgeries / Procedures: Yes  Date of Onset of Symptoms: 10/02/2012  Guideline(s) Used:  Penis and Scrotum Symptoms  Disposition Per Guideline:   See Today or Tomorrow in Office  Reason For Disposition Reached:   ALL other penis - scrotum symptoms (Exception: painless rash < 24 hours duration)  Advice Given:  N/A  Appointment Scheduled:  10/07/2012 09:00:00 Appointment Scheduled Provider:  Dr. Drue Novel

## 2012-10-07 ENCOUNTER — Encounter: Payer: Self-pay | Admitting: Internal Medicine

## 2012-10-07 ENCOUNTER — Ambulatory Visit (INDEPENDENT_AMBULATORY_CARE_PROVIDER_SITE_OTHER): Payer: Self-pay | Admitting: Internal Medicine

## 2012-10-07 ENCOUNTER — Other Ambulatory Visit: Payer: Self-pay | Admitting: Internal Medicine

## 2012-10-07 VITALS — BP 120/70 | Temp 97.9°F | Wt 184.0 lb

## 2012-10-07 DIAGNOSIS — R361 Hematospermia: Secondary | ICD-10-CM

## 2012-10-07 LAB — POCT URINALYSIS DIPSTICK
Glucose, UA: NEGATIVE
Nitrite, UA: NEGATIVE
Spec Grav, UA: 1.02
Urobilinogen, UA: 0.2
pH, UA: 6.5

## 2012-10-07 NOTE — Patient Instructions (Addendum)
Please see your PCP in about a month Call if symptoms severe, fever chills, difficulty urinating

## 2012-10-07 NOTE — Progress Notes (Signed)
  Subjective:    Patient ID: [name], male    DOB: [birth year], 32 y.o.   MRN: [id number]  HPI [facility], Acute visit Two episodes of blood in the semen x the last week, no history of previous episodes like this one. Denies dysuria, difficulty urinating. Has a single sexual partner, reports use of a condom. No penile d/c  No fever or chills. He has ulcerative colitis however symptoms are relatively well controlled for the last few weeks, no blood in the stools.  Past Medical History: ANXIETY, DEPRESSION, Bipolar--- Dr. 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   Alcohol Use - no Daily Caffeine Use- 2 Illicit Drug Use - no Patient gets regular exercise.   Review of Systems See HPI    Objective:   Physical Exam General -- alert, well-developed  Abdomen--soft, non-tender, no distention, no masses, no HSM, no guarding, and no rigidity.   Extremities-- no pretibial edema bilaterally Rectal--  Small skin tags noted. Normal sphincter tone. No rectal masses. Brown stool, mild diffuse discomfort. Prostate:  Prostate gland firm and smooth, no enlargement, nodularity, tenderness, mass, asymmetry or induration. Neurologic-- alert & oriented X3 and strength normal in all extremities. Psych-- Cognition and judgment appear intact. Alert and cooperative with normal attention span and concentration.  not anxious appearing and not depressed appearing.       Assessment & Plan:   Hematospermia, In most cases this  condition is  benign, GU exam is essentially normal. Plan: Check a UA, urine culture, G&C. Reassess in one month. If symptoms persist, he may need to be seen by urology, see instructions

## 2012-10-11 ENCOUNTER — Encounter: Payer: Self-pay | Admitting: *Deleted

## 2012-10-23 ENCOUNTER — Telehealth: Payer: Self-pay | Admitting: Gastroenterology

## 2012-10-23 DIAGNOSIS — K5190 Ulcerative colitis, unspecified, without complications: Secondary | ICD-10-CM

## 2012-10-24 MED ORDER — BUDESONIDE 9 MG PO TB24: 9.0000 mg | ORAL_TABLET | Freq: Every day | ORAL | Status: DC

## 2012-10-24 NOTE — Telephone Encounter (Signed)
Begin uceris 9mg  qd. He needs OV next 2-3 weeks, CBC, CMET

## 2012-10-24 NOTE — Telephone Encounter (Signed)
Pt states he has been having diarrhea with some blood in it and a lot of cramping for about a week. Pt wants to know if there is something stronger he can take for the cramping instead of Bentyl. Pt also wants to know if he should start Prednisone. Dr. Arlyce Dice please advise.

## 2012-10-24 NOTE — Telephone Encounter (Signed)
Pt aware. States he will call back to schedule the OV when he has his work schedule. Script sent to the pharmacy.

## 2012-11-03 ENCOUNTER — Telehealth: Payer: Self-pay | Admitting: *Deleted

## 2012-11-03 MED ORDER — PREDNISONE 10 MG PO TABS: 20.0000 mg | ORAL_TABLET | Freq: Every day | ORAL | Status: DC

## 2012-11-03 NOTE — Telephone Encounter (Signed)
Dr Arlyce Dice, the pt stated that the Uceris is too expensive for him. It 70$ a month. Pt needs a more affordable med

## 2012-11-03 NOTE — Telephone Encounter (Signed)
Begin prednisone 20 mg daily. He should call back 4-5 days

## 2012-11-03 NOTE — Telephone Encounter (Signed)
I sent in Prednisone for patient  Now he wants to know if you can call him something stronger in for abdominal spasms than Bentyl

## 2012-11-06 ENCOUNTER — Other Ambulatory Visit: Payer: Self-pay | Admitting: Gastroenterology

## 2012-11-06 MED ORDER — HYOSCYAMINE SULFATE ER 0.375 MG PO TB12: 0.3750 mg | ORAL_TABLET | Freq: Two times a day (BID) | ORAL | Status: DC | PRN

## 2012-11-06 NOTE — Telephone Encounter (Signed)
New med sent to pharmacy

## 2012-11-06 NOTE — Telephone Encounter (Signed)
D/c bentyl; begin hyomax 0.375mg  bid prn

## 2012-11-08 ENCOUNTER — Other Ambulatory Visit: Payer: Self-pay | Admitting: Gastroenterology

## 2012-12-06 ENCOUNTER — Other Ambulatory Visit: Payer: Self-pay | Admitting: Gastroenterology

## 2013-01-09 ENCOUNTER — Telehealth: Payer: Self-pay | Admitting: Gastroenterology

## 2013-01-09 NOTE — Telephone Encounter (Signed)
Pt states he had a flare several days ago and was late to work. He is working 2 jobs and would like a note stating that he has IBD and that it makes him go to the bathroom frequently and may lead to him being late. Letter left up front for pt to pick up.

## 2013-04-12 ENCOUNTER — Other Ambulatory Visit: Payer: Self-pay | Admitting: Gastroenterology

## 2013-05-05 ENCOUNTER — Other Ambulatory Visit: Payer: Self-pay | Admitting: Gastroenterology

## 2013-05-17 ENCOUNTER — Telehealth: Payer: Self-pay | Admitting: Gastroenterology

## 2013-05-17 NOTE — Telephone Encounter (Signed)
Left message for pt to call back.  Pt wants to know if Dr. Arlyce Dice will write him a letter to get disability. States that due to his UC he is unable to work. States he has a sample letter if Dr. Arlyce Dice needs to see it. Dr. Arlyce Dice will you write the letter? Please advise.

## 2013-05-18 NOTE — Telephone Encounter (Signed)
I have not seen the patient in the office since March, 2013 signup prepared to write a letter for disability at this time.  He needs to come to the office.

## 2013-05-18 NOTE — Telephone Encounter (Signed)
Pt aware and scheduled to see Dr. Arlyce Dice 06/05/13@2 :30pm. Pt aware of appt.

## 2013-06-05 ENCOUNTER — Other Ambulatory Visit (INDEPENDENT_AMBULATORY_CARE_PROVIDER_SITE_OTHER): Payer: BC Managed Care – PPO

## 2013-06-05 ENCOUNTER — Ambulatory Visit (INDEPENDENT_AMBULATORY_CARE_PROVIDER_SITE_OTHER): Payer: BC Managed Care – PPO | Admitting: Gastroenterology

## 2013-06-05 ENCOUNTER — Encounter: Payer: Self-pay | Admitting: Gastroenterology

## 2013-06-05 VITALS — BP 100/70 | HR 112 | Ht 74.0 in | Wt 147.1 lb

## 2013-06-05 DIAGNOSIS — K5190 Ulcerative colitis, unspecified, without complications: Secondary | ICD-10-CM

## 2013-06-05 LAB — COMPREHENSIVE METABOLIC PANEL
ALT: 15 U/L (ref 0–53)
AST: 18 U/L (ref 0–37)
CO2: 29 mEq/L (ref 19–32)
Calcium: 9.6 mg/dL (ref 8.4–10.5)
Chloride: 100 mEq/L (ref 96–112)
Creatinine, Ser: 0.7 mg/dL (ref 0.4–1.5)
GFR: 132.86 mL/min (ref >60.00)
Glucose, Bld: 94 mg/dL (ref 70–99)
Sodium: 137 mEq/L (ref 135–145)
Total Protein: 7.2 g/dL (ref 6.0–8.3)

## 2013-06-05 LAB — CBC WITH DIFFERENTIAL/PLATELET
Basophils Relative: 0.5 % (ref 0.0–3.0)
Eosinophils Relative: 0.7 % (ref 0.0–5.0)
HCT: 38.1 % — ABNORMAL LOW (ref 39.0–52.0)
Lymphocytes Relative: 25.1 % (ref 12.0–46.0)
Lymphs Abs: 1.4 10*3/uL (ref 0.7–4.0)
MCV: 103.2 fl — ABNORMAL HIGH (ref 78.0–100.0)
Monocytes Absolute: 0.4 10*3/uL (ref 0.1–1.0)
Monocytes Relative: 7.6 % (ref 3.0–12.0)
Platelets: 385 10*3/uL (ref 150.0–400.0)
RBC: 3.69 Mil/uL — ABNORMAL LOW (ref 4.22–5.81)
WBC: 5.5 10*3/uL (ref 4.5–10.5)

## 2013-06-05 LAB — C-REACTIVE PROTEIN: CRP: <0.5 mg/dL (ref 0.5–20.0)

## 2013-06-05 NOTE — Assessment & Plan Note (Signed)
Patient clearly is symptomatic despite Imuran, Azulfidine and budesonide.  Since he has not followed up he has remained on full dose budesonide.  Medical compliance is an issue as well as reliability.  At this point I would consider biological therapy  Recommendations #1 colonoscopy #2 check CBC, CRP and comprehensive metabolic profile #3 PPD #4 continue current medications at present; increase Azulfidine to 1500 mg twice a day

## 2013-06-05 NOTE — Progress Notes (Signed)
History of Present Illness:  The patient has returned for followup of ulcerative colitis.  Since his last visit he's had a 30 pound weight loss and complains of frequent loose stools during the day and at night.  [name] had no frank bleeding.  He has not followed up and remains on budesonide 9 mg daily, in addition to Azulfidine and Imuran.  He is intolerant of many foods.  He is having trouble keeping a job and is asking for temporary disability.    Review of Systems: Pertinent positive and negative review of systems were noted in the above HPI section. All other review of systems were otherwise negative.    Current Medications, Allergies, Past Medical History, Past Surgical History, Family History and Social History were reviewed in [employer] electronic medical record  Vital signs were reviewed in today's medical record. Physical Exam: General: Thin white male in no acute distress Skin: anicteric Head: Normocephalic and atraumatic Eyes:  sclerae anicteric, EOMI Ears: Normal auditory acuity Mouth: No deformity or lesions Lungs: Clear throughout to auscultation Heart: Regular rate and rhythm; no murmurs, rubs or bruits Abdomen: Soft, non tender and non distended. No masses, hepatosplenomegaly or hernias noted. Normal Bowel sounds Rectal:deferred Musculoskeletal: Symmetrical with no gross deformities  Pulses:  Normal pulses noted Extremities: No clubbing, cyanosis, edema or deformities noted Neurological: Alert oriented x 4, grossly nonfocal Psychological:  Alert and cooperative. Normal mood and affect

## 2013-06-05 NOTE — Patient Instructions (Addendum)
Go to the basement for labs today Increase Sulfasalazine to 1500 mg daily Decrease Budesonide to 9mg  daily You need a PPD text today You will need to call back with the information the Pneumovax and the Flu shot from your PCP   It has been recommended to you by your physician that you have a(n) Colonoscopy completed. Per your request, we did not schedule the procedure(s) today. Please contact our office at [phone] should you decide to have the procedure completed.  At the request of disability we have added an Albumin test to your labs

## 2013-06-06 ENCOUNTER — Encounter: Payer: Self-pay | Admitting: Gastroenterology

## 2013-06-07 ENCOUNTER — Telehealth: Payer: Self-pay | Admitting: Gastroenterology

## 2013-06-07 LAB — TB SKIN TEST
Induration: 0 mm
TB Skin Test: NEGATIVE

## 2013-06-07 NOTE — Telephone Encounter (Signed)
Patient is asking for lab results from OV. Please, advise

## 2013-06-07 NOTE — Telephone Encounter (Signed)
All lab work appears normal

## 2013-06-07 NOTE — Telephone Encounter (Signed)
Left a message for patient to call me. 

## 2013-06-08 NOTE — Telephone Encounter (Signed)
Lef

## 2013-06-08 NOTE — Telephone Encounter (Signed)
Left a message for patient to call me. 

## 2013-06-19 ENCOUNTER — Other Ambulatory Visit: Payer: Self-pay | Admitting: Gastroenterology

## 2013-06-19 ENCOUNTER — Ambulatory Visit (AMBULATORY_SURGERY_CENTER): Payer: BC Managed Care – PPO

## 2013-06-19 VITALS — Ht 74.0 in | Wt 146.8 lb

## 2013-06-19 DIAGNOSIS — K5289 Other specified noninfective gastroenteritis and colitis: Secondary | ICD-10-CM

## 2013-06-19 MED ORDER — NA SULFATE-K SULFATE-MG SULF 17.5-3.13-1.6 GM/177ML PO SOLN: ORAL | Status: DC

## 2013-06-20 ENCOUNTER — Encounter: Payer: Self-pay | Admitting: Gastroenterology

## 2013-06-21 ENCOUNTER — Other Ambulatory Visit: Payer: Self-pay

## 2013-06-28 ENCOUNTER — Telehealth: Payer: Self-pay | Admitting: Gastroenterology

## 2013-06-29 ENCOUNTER — Telehealth: Payer: Self-pay | Admitting: Gastroenterology

## 2013-06-29 MED ORDER — TRAMADOL HCL 50 MG PO TABS: 50.0000 mg | ORAL_TABLET | Freq: Four times a day (QID) | ORAL | Status: DC | PRN

## 2013-06-29 MED ORDER — PREDNISONE 10 MG PO TABS: 20.0000 mg | ORAL_TABLET | Freq: Every day | ORAL | Status: DC

## 2013-06-29 NOTE — Telephone Encounter (Signed)
Called in script for tramadol to [facility].

## 2013-06-29 NOTE — Telephone Encounter (Signed)
He should be on Imuran 150 mg daily and Azulfidine 1500 mg twice a day. Discontinue budesonide Begin prednisone 20 mg daily

## 2013-06-29 NOTE — Telephone Encounter (Signed)
He can have Ultram 50-100 mg every 6 hours when necessary

## 2013-06-29 NOTE — Telephone Encounter (Signed)
Correction on Imuran by me; 3-50mg  tabs daily= 150mg . Informed pt of Dr Marzetta Board orders and verified his Imuran and Azulfidine does which were correct. He was instructed to stop the budesonide and begin Prednisone.  Pt stated understanding, but states he has a headache and a backache as well as abdominal pain; states he hit is head against the wall. Instructed pt to call his PCP for the headache and hopefully, the prednisone will help the abdominal pain. Dr Arlyce Dice, do you want to address the pain issue? Thanks.

## 2013-06-29 NOTE — Telephone Encounter (Signed)
Pt last seen on 06/05/13; hx of UC. Pt seen for weight loss, intermittent diarrhea, fatigue, abd pain. He is on budesonide, Imuran increased to 1500mg  bid and a COLON is scheduled for 07/06/13. He reports increased diarrhea with blood on and off and back pain and lower abd pain that Levbid is not helping. He got up in the middle of the day for an urgent stool and hit his head and has a cut in his eyebrow. I offered him an appt today, but he states with the COLON next week, he just would like something stronger for the back and abdominal pain. Please advise. Thanks.

## 2013-06-29 NOTE — Telephone Encounter (Signed)
Informed pt we ordered ultram; he stated understanding.

## 2013-07-03 ENCOUNTER — Telehealth: Payer: Self-pay | Admitting: Gastroenterology

## 2013-07-03 NOTE — Telephone Encounter (Signed)
Patient wants refill on pain meds

## 2013-07-04 NOTE — Telephone Encounter (Signed)
Ok for tramadol

## 2013-07-05 MED ORDER — TRAMADOL HCL 50 MG PO TABS: 50.0000 mg | ORAL_TABLET | Freq: Four times a day (QID) | ORAL | Status: DC | PRN

## 2013-07-05 NOTE — Telephone Encounter (Signed)
Called pt to inform medication was sent to pharmacy  Faxed

## 2013-07-06 ENCOUNTER — Other Ambulatory Visit: Payer: Self-pay | Admitting: Gastroenterology

## 2013-07-06 ENCOUNTER — Ambulatory Visit (AMBULATORY_SURGERY_CENTER): Payer: BC Managed Care – PPO | Admitting: Gastroenterology

## 2013-07-06 ENCOUNTER — Encounter: Payer: Self-pay | Admitting: Gastroenterology

## 2013-07-06 VITALS — BP 110/75 | HR 80 | Temp 96.9°F | Resp 13 | Ht 74.0 in | Wt 146.0 lb

## 2013-07-06 DIAGNOSIS — K5289 Other specified noninfective gastroenteritis and colitis: Secondary | ICD-10-CM

## 2013-07-06 MED ORDER — SODIUM CHLORIDE 0.9 % IV SOLN: 500.0000 mL | INTRAVENOUS | Status: DC

## 2013-07-06 NOTE — Progress Notes (Signed)
Patient was in bathroom for approx 20 minutes.  His bowel was not cleaned out prior to procedure.

## 2013-07-06 NOTE — Progress Notes (Signed)
Called to room to assist during endoscopic procedure.  Patient ID and intended procedure confirmed with present staff. Received instructions for my participation in the procedure from the performing physician. ewm 

## 2013-07-06 NOTE — Progress Notes (Signed)
  [facility] Anesthesia Post-op Note  Patient: [name]  Procedure(s) Performed: colonoscopy  Patient Location: [facility] - Recovery Area  Anesthesia Type: Deep Sedation/Propofol  Level of Consciousness: awake, oriented and patient cooperative  Airway and Oxygen Therapy: Patient Spontanous Breathing  Post-op Pain: none  Post-op Assessment:  Post-op Vital signs reviewed, Patient's Cardiovascular Status Stable, Respiratory Function Stable, Patent Airway, No signs of Nausea or vomiting and Pain level controlled  Post-op Vital Signs: Reviewed and stable  Complications: No apparent anesthesia complications  Kamaya Keckler E 4:30 PM

## 2013-07-06 NOTE — Progress Notes (Signed)
Patient did not have preoperative order for IV antibiotic SSI prophylaxis. (G8918)  Patient did not experience any of the following events: a burn prior to discharge; a fall within the facility; wrong site/side/patient/procedure/implant event; or a hospital transfer or hospital admission upon discharge from the facility. (G8907)  

## 2013-07-06 NOTE — Op Note (Signed)
[facility] [address] [place], [address]   COLONOSCOPY PROCEDURE REPORT  PATIENT: [name], [name]  MR#: [id number] BIRTHDATE: [birth year] , 32  yrs. old GENDER: Male ENDOSCOPIST: Louis Meckel, MD REFERRED BY: PROCEDURE DATE:  07/06/2013 PROCEDURE:   Colonoscopy with biopsy First Screening Colonoscopy - Avg.  risk and is 50 yrs.  old or older - No.  Prior Negative Screening - Now for repeat screening. N/A  History of Adenoma - Now for follow-up colonoscopy & has been > or = to 3 yrs.  N/A  Polyps Removed Today? No.  Recommend repeat exam, <10 yrs? No. ASA CLASS:   Class II INDICATIONS:follow up for previously diagnosed colitis. MEDICATIONS: MAC sedation, administered by CRNA and propofol (Diprivan) 150mg  IV  DESCRIPTION OF PROCEDURE:   After the risks benefits and alternatives of the procedure were thoroughly explained, informed consent was obtained.  A digital rectal exam revealed no abnormalities of the rectum.   The LB [id number]  endoscope was introduced through the anus and advanced to the cecum, which was identified by both the appendix and ileocecal valve. No adverse events experienced.   The quality of the prep was Suprep good  The instrument was then slowly withdrawn as the colon was fully examined.      COLON FINDINGS: Throughout the colon there were diffuse areas of submucosal hemorrhage, severe erythema and multiple areas of deep ulcerations.  Inflammatory changes involve the entire colon. Biopsies were taken throughout the colon.  Retroflexed views revealed no abnormalities. The time to cecum=5 minutes 45 seconds. Withdrawal time=6 minutes 28 seconds.  The scope was withdrawn and the procedure completed. COMPLICATIONS: There were no complications.  ENDOSCOPIC IMPRESSION: ulcerative colitis-severe  RECOMMENDATIONS: 1.  continue current medications including Imuran and Azulfidine 2.  increase prednisone to 40 mg daily 3.   if no prompt improvement in the next 2-3 days we'll admit for bowel rest, IV steroids 4.  begin biologic therapy with Remicade  eSigned:  Louis Meckel, MD 07/06/2013 4:33 PM   cc: Etta Grandchild, MD and Zelphia Cairo MD   PATIENT NAME:  [name], [name] MR#: [id number]

## 2013-07-06 NOTE — Patient Instructions (Addendum)
YOU HAD AN ENDOSCOPIC PROCEDURE TODAY AT THE [facility]: Refer to the procedure report that was given to you for any specific questions about what was found during the examination.  If the procedure report does not answer your questions, please call your gastroenterologist to clarify.  If you requested that your care partner not be given the details of your procedure findings, then the procedure report has been included in a sealed envelope for you to review at your convenience later.  YOU SHOULD EXPECT: Some feelings of bloating in the abdomen. Passage of more gas than usual.  Walking can help get rid of the air that was put into your GI tract during the procedure and reduce the bloating. If you had a lower endoscopy (such as a colonoscopy or flexible sigmoidoscopy) you may notice spotting of blood in your stool or on the toilet paper. If you underwent a bowel prep for your procedure, then you may not have a normal bowel movement for a few days.  DIET: Your first meal following the procedure should be a light meal and then it is ok to progress to your normal diet.  A half-sandwich or bowl of soup is an example of a good first meal.  Heavy or fried foods are harder to digest and may make you feel nauseous or bloated.  Likewise meals heavy in dairy and vegetables can cause extra gas to form and this can also increase the bloating.  Drink plenty of fluids but you should avoid alcoholic beverages for 24 hours.  ACTIVITY: Your care partner should take you home directly after the procedure.  You should plan to take it easy, moving slowly for the rest of the day.  You can resume normal activity the day after the procedure however you should NOT DRIVE or use heavy machinery for 24 hours (because of the sedation medicines used during the test).    SYMPTOMS TO REPORT IMMEDIATELY: A gastroenterologist can be reached at any hour.  During normal business hours, 8:30 AM to 5:00 PM Monday through  Friday, call [phone].  After hours and on weekends, please call the GI answering service at [phone] who will take a message and have the physician on call contact you.   Following lower endoscopy (colonoscopy or flexible sigmoidoscopy):  Excessive amounts of blood in the stool  Significant tenderness or worsening of abdominal pains  Swelling of the abdomen that is new, acute  Fever of 100F or higher   FOLLOW UP: If any biopsies were taken you will be contacted by phone or by letter within the next 1-3 weeks.  Call your gastroenterologist if you have not heard about the biopsies in 3 weeks.  Our staff will call the home number listed on your records the next business day following your procedure to check on you and address any questions or concerns that you may have at that time regarding the information given to you following your procedure. This is a courtesy call and so if there is no answer at the home number and we have not heard from you through the emergency physician on call, we will assume that you have returned to your regular daily activities without incident.  SIGNATURES/CONFIDENTIALITY: You and/or your care partner have signed paperwork which will be entered into your electronic medical record.  These signatures attest to the fact that that the information above on your After Visit Summary has been reviewed and is understood.  Full responsibility of the confidentiality  of this discharge information lies with you and/or your care-partner.  Increase prednisone to 40 mg daily  Continue current medications including Imuran and Azulfidine  Call on Monday, Nov 24 to let Dr Arlyce Dice know how you are feeling.  If no improvement, will admit for bowel rest and IV Steroids  Then begin therapy with Remicade  Information sheets given on Ulcerative Colitis, Low fiber and low residue and pamphlet on remicade and preparing for remicade infusion.

## 2013-07-09 ENCOUNTER — Telehealth: Payer: Self-pay

## 2013-07-09 ENCOUNTER — Inpatient Hospital Stay (HOSPITAL_COMMUNITY)
Admission: AD | Admit: 2013-07-09 | Discharge: 2013-07-22 | DRG: 385 | Disposition: A | Discharge status: Home / Self Care | Payer: BC Managed Care – PPO | Source: Ambulatory Visit | Attending: Gastroenterology | Admitting: Gastroenterology

## 2013-07-09 ENCOUNTER — Encounter (HOSPITAL_COMMUNITY): Payer: Self-pay

## 2013-07-09 ENCOUNTER — Telehealth: Payer: Self-pay | Admitting: *Deleted

## 2013-07-09 DIAGNOSIS — F319 Bipolar disorder, unspecified: Secondary | ICD-10-CM | POA: Diagnosis present

## 2013-07-09 DIAGNOSIS — Z79899 Other long term (current) drug therapy: Secondary | ICD-10-CM

## 2013-07-09 DIAGNOSIS — F3181 Bipolar II disorder: Secondary | ICD-10-CM

## 2013-07-09 DIAGNOSIS — F411 Generalized anxiety disorder: Secondary | ICD-10-CM | POA: Diagnosis present

## 2013-07-09 DIAGNOSIS — R633 Feeding difficulties: Secondary | ICD-10-CM

## 2013-07-09 DIAGNOSIS — F909 Attention-deficit hyperactivity disorder, unspecified type: Secondary | ICD-10-CM | POA: Diagnosis present

## 2013-07-09 DIAGNOSIS — K5190 Ulcerative colitis, unspecified, without complications: Secondary | ICD-10-CM

## 2013-07-09 DIAGNOSIS — Z681 Body mass index (BMI) 19 or less, adult: Secondary | ICD-10-CM

## 2013-07-09 DIAGNOSIS — K625 Hemorrhage of anus and rectum: Secondary | ICD-10-CM | POA: Diagnosis present

## 2013-07-09 DIAGNOSIS — E43 Unspecified severe protein-calorie malnutrition: Secondary | ICD-10-CM

## 2013-07-09 DIAGNOSIS — D7589 Other specified diseases of blood and blood-forming organs: Secondary | ICD-10-CM | POA: Diagnosis present

## 2013-07-09 DIAGNOSIS — Z9119 Patient's noncompliance with other medical treatment and regimen: Secondary | ICD-10-CM

## 2013-07-09 DIAGNOSIS — K5100 Ulcerative (chronic) pancolitis without complications: Principal | ICD-10-CM | POA: Diagnosis present

## 2013-07-09 DIAGNOSIS — Z881 Allergy status to other antibiotic agents status: Secondary | ICD-10-CM

## 2013-07-09 DIAGNOSIS — D539 Nutritional anemia, unspecified: Secondary | ICD-10-CM | POA: Diagnosis present

## 2013-07-09 DIAGNOSIS — Z91199 Patient's noncompliance with other medical treatment and regimen due to unspecified reason: Secondary | ICD-10-CM

## 2013-07-09 DIAGNOSIS — G4700 Insomnia, unspecified: Secondary | ICD-10-CM | POA: Diagnosis present

## 2013-07-09 DIAGNOSIS — R634 Abnormal weight loss: Secondary | ICD-10-CM | POA: Diagnosis present

## 2013-07-09 DIAGNOSIS — F3189 Other bipolar disorder: Secondary | ICD-10-CM

## 2013-07-09 LAB — COMPREHENSIVE METABOLIC PANEL
AST: 13 U/L (ref 0–37)
Albumin: 3.6 g/dL (ref 3.5–5.2)
Alkaline Phosphatase: 76 U/L (ref 39–117)
BUN: 11 mg/dL (ref 6–23)
CO2: 28 mEq/L (ref 19–32)
Chloride: 98 mEq/L (ref 96–112)
Creatinine, Ser: 0.81 mg/dL (ref 0.50–1.35)
Total Bilirubin: 0.4 mg/dL (ref 0.3–1.2)
Total Protein: 7.8 g/dL (ref 6.0–8.3)

## 2013-07-09 LAB — CBC WITH DIFFERENTIAL/PLATELET
Basophils Absolute: 0 K/uL (ref 0.0–0.1)
Basophils Relative: 0 % (ref 0–1)
Eosinophils Absolute: 0 K/uL (ref 0.0–0.7)
Eosinophils Relative: 0 % (ref 0–5)
HCT: 35.3 % — ABNORMAL LOW (ref 39.0–52.0)
Hemoglobin: 12.1 g/dL — ABNORMAL LOW (ref 13.0–17.0)
Lymphocytes Relative: 10 % — ABNORMAL LOW (ref 12–46)
Lymphs Abs: 0.4 K/uL — ABNORMAL LOW (ref 0.7–4.0)
MCH: 34.4 pg — ABNORMAL HIGH (ref 26.0–34.0)
MCHC: 34.3 g/dL (ref 30.0–36.0)
MCV: 100.3 fL — ABNORMAL HIGH (ref 78.0–100.0)
Monocytes Absolute: 0.1 K/uL (ref 0.1–1.0)
Monocytes Relative: 2 % — ABNORMAL LOW (ref 3–12)
Neutro Abs: 3.7 K/uL (ref 1.7–7.7)
Neutrophils Relative %: 88 % — ABNORMAL HIGH (ref 43–77)
Platelets: 445 K/uL — ABNORMAL HIGH (ref 150–400)
RBC: 3.52 MIL/uL — ABNORMAL LOW (ref 4.22–5.81)
RDW: 13 % (ref 11.5–15.5)
WBC: 4.2 K/uL (ref 4.0–10.5)

## 2013-07-09 LAB — PROTIME-INR: INR: 1.03 (ref 0.00–1.49)

## 2013-07-09 LAB — MAGNESIUM: Magnesium: 2 mg/dL (ref 1.5–2.5)

## 2013-07-09 LAB — PHOSPHORUS: Phosphorus: 4.4 mg/dL (ref 2.3–4.6)

## 2013-07-09 LAB — PREALBUMIN: Prealbumin: 21.3 mg/dL (ref 17.0–34.0)

## 2013-07-09 MED ORDER — POTASSIUM CHLORIDE IN NACL 20-0.45 MEQ/L-% IV SOLN
INTRAVENOUS | Status: AC
  Administered 2013-07-09: 23:00:00 via INTRAVENOUS
  Filled 2013-07-09 (×3): qty 1000

## 2013-07-09 MED ORDER — ZIPRASIDONE HCL 40 MG PO CAPS
80.0000 mg | ORAL_CAPSULE | Freq: Every day | ORAL | Status: DC
Start: 2013-07-10 — End: 2013-07-22
  Administered 2013-07-10 – 2013-07-22 (×13): 80 mg via ORAL
  Filled 2013-07-09 (×15): qty 2

## 2013-07-09 MED ORDER — AMPHETAMINE-DEXTROAMPHET ER 10 MG PO CP24: 20.0000 mg | ORAL_CAPSULE | ORAL | Status: DC

## 2013-07-09 MED ORDER — SODIUM CHLORIDE 0.9 % IJ SOLN
10.0000 mL | INTRAMUSCULAR | Status: DC | PRN
  Administered 2013-07-11: 10 mL

## 2013-07-09 MED ORDER — POTASSIUM CHLORIDE IN NACL 20-0.45 MEQ/L-% IV SOLN
INTRAVENOUS | Status: DC
  Administered 2013-07-09: 19:00:00 via INTRAVENOUS
  Filled 2013-07-09: qty 1000

## 2013-07-09 MED ORDER — ZIPRASIDONE HCL 40 MG PO CAPS
40.0000 mg | ORAL_CAPSULE | Freq: Every evening | ORAL | Status: DC
  Administered 2013-07-09 – 2013-07-21 (×13): 40 mg via ORAL
  Filled 2013-07-09 (×15): qty 1

## 2013-07-09 MED ORDER — ONDANSETRON HCL 4 MG PO TABS
4.0000 mg | ORAL_TABLET | Freq: Four times a day (QID) | ORAL | Status: DC | PRN
  Administered 2013-07-20 – 2013-07-22 (×2): 4 mg via ORAL
  Filled 2013-07-09 (×2): qty 1

## 2013-07-09 MED ORDER — AMPHETAMINE-DEXTROAMPHETAMINE 20 MG PO TABS
20.0000 mg | ORAL_TABLET | Freq: Four times a day (QID) | ORAL | Status: DC
  Administered 2013-07-09 – 2013-07-10 (×5): 20 mg via ORAL
  Filled 2013-07-09 (×5): qty 1

## 2013-07-09 MED ORDER — HYOSCYAMINE SULFATE ER 0.375 MG PO TB12
0.3750 mg | ORAL_TABLET | Freq: Two times a day (BID) | ORAL | Status: DC | PRN
  Filled 2013-07-09: qty 1

## 2013-07-09 MED ORDER — AZATHIOPRINE 50 MG PO TABS
150.0000 mg | ORAL_TABLET | Freq: Every day | ORAL | Status: DC
  Administered 2013-07-10 – 2013-07-22 (×13): 150 mg via ORAL
  Filled 2013-07-09 (×13): qty 3

## 2013-07-09 MED ORDER — FAT EMULSION 20 % IV EMUL
250.0000 mL | INTRAVENOUS | Status: AC
  Administered 2013-07-09: 250 mL via INTRAVENOUS
  Filled 2013-07-09: qty 250

## 2013-07-09 MED ORDER — ACETAMINOPHEN 650 MG RE SUPP: 650.0000 mg | Freq: Four times a day (QID) | RECTAL | Status: DC | PRN

## 2013-07-09 MED ORDER — ZIPRASIDONE HCL 80 MG PO CAPS: 80.0000 mg | ORAL_CAPSULE | Freq: Every day | ORAL | Status: DC

## 2013-07-09 MED ORDER — ALPRAZOLAM 1 MG PO TABS
2.0000 mg | ORAL_TABLET | Freq: Three times a day (TID) | ORAL | Status: DC | PRN
  Administered 2013-07-09 – 2013-07-22 (×36): 2 mg via ORAL
  Filled 2013-07-09 (×38): qty 2

## 2013-07-09 MED ORDER — ADULT MULTIVITAMIN W/MINERALS CH
1.0000 | ORAL_TABLET | Freq: Every day | ORAL | Status: DC
  Administered 2013-07-10 – 2013-07-22 (×13): 1 via ORAL
  Filled 2013-07-09 (×13): qty 1

## 2013-07-09 MED ORDER — FAT EMULSION 20 % IV EMUL
7.0000 mL | INTRAVENOUS | Status: DC
  Filled 2013-07-09 (×48): qty 100

## 2013-07-09 MED ORDER — ZOLPIDEM TARTRATE 10 MG PO TABS
10.0000 mg | ORAL_TABLET | Freq: Every evening | ORAL | Status: DC | PRN
  Administered 2013-07-10 – 2013-07-22 (×10): 10 mg via ORAL
  Filled 2013-07-09 (×10): qty 1

## 2013-07-09 MED ORDER — SULFASALAZINE 500 MG PO TABS
1500.0000 mg | ORAL_TABLET | Freq: Two times a day (BID) | ORAL | Status: DC
  Administered 2013-07-09 – 2013-07-16 (×15): 1500 mg via ORAL
  Filled 2013-07-09 (×18): qty 3

## 2013-07-09 MED ORDER — HYDROCODONE-ACETAMINOPHEN 5-325 MG PO TABS
1.0000 | ORAL_TABLET | ORAL | Status: DC | PRN
  Administered 2013-07-09 – 2013-07-15 (×26): 2 via ORAL
  Administered 2013-07-15 (×2): 1 via ORAL
  Administered 2013-07-15 (×2): 2 via ORAL
  Administered 2013-07-15: 1 via ORAL
  Administered 2013-07-15 – 2013-07-22 (×30): 2 via ORAL
  Filled 2013-07-09 (×10): qty 2
  Filled 2013-07-09: qty 1
  Filled 2013-07-09 (×28): qty 2
  Filled 2013-07-09: qty 1
  Filled 2013-07-09 (×8): qty 2
  Filled 2013-07-09: qty 1
  Filled 2013-07-09 (×6): qty 2
  Filled 2013-07-09: qty 1
  Filled 2013-07-09 (×6): qty 2

## 2013-07-09 MED ORDER — CLINIMIX E/DEXTROSE (5/15) 5 % IV SOLN
INTRAVENOUS | Status: AC
  Administered 2013-07-09: 22:00:00 via INTRAVENOUS
  Filled 2013-07-09: qty 1000

## 2013-07-09 MED ORDER — ALTEPLASE 2 MG IJ SOLR
2.0000 mg | Freq: Once | INTRAMUSCULAR | Status: AC
  Administered 2013-07-09: 2 mg
  Filled 2013-07-09: qty 2

## 2013-07-09 MED ORDER — TRAZODONE HCL 100 MG PO TABS
200.0000 mg | ORAL_TABLET | Freq: Every evening | ORAL | Status: DC | PRN
  Administered 2013-07-10 – 2013-07-22 (×12): 200 mg via ORAL
  Filled 2013-07-09 (×13): qty 2

## 2013-07-09 MED ORDER — CLINIMIX E/DEXTROSE (5/15) 5 % IV SOLN
INTRAVENOUS | Status: DC
  Filled 2013-07-09: qty 1000

## 2013-07-09 MED ORDER — LAMOTRIGINE 200 MG PO TABS
200.0000 mg | ORAL_TABLET | Freq: Every day | ORAL | Status: DC
  Administered 2013-07-10 – 2013-07-22 (×13): 200 mg via ORAL
  Filled 2013-07-09 (×13): qty 1

## 2013-07-09 MED ORDER — ONDANSETRON HCL 4 MG/2ML IJ SOLN
4.0000 mg | Freq: Four times a day (QID) | INTRAMUSCULAR | Status: DC | PRN
  Administered 2013-07-19: 4 mg via INTRAVENOUS
  Filled 2013-07-09 (×2): qty 2

## 2013-07-09 MED ORDER — ACETAMINOPHEN 325 MG PO TABS: 650.0000 mg | ORAL_TABLET | Freq: Four times a day (QID) | ORAL | Status: DC | PRN

## 2013-07-09 MED ORDER — INSULIN ASPART 100 UNIT/ML ~~LOC~~ SOLN
0.0000 [IU] | Freq: Four times a day (QID) | SUBCUTANEOUS | Status: DC
  Administered 2013-07-10 – 2013-07-11 (×5): 1 [IU] via SUBCUTANEOUS
  Administered 2013-07-11: 9 [IU] via SUBCUTANEOUS
  Administered 2013-07-12: 2 [IU] via SUBCUTANEOUS
  Administered 2013-07-13 – 2013-07-16 (×7): 1 [IU] via SUBCUTANEOUS

## 2013-07-09 MED ORDER — METHYLPREDNISOLONE SODIUM SUCC 40 MG IJ SOLR
40.0000 mg | Freq: Every day | INTRAMUSCULAR | Status: DC
  Administered 2013-07-09: 40 mg via INTRAVENOUS
  Filled 2013-07-09 (×3): qty 1

## 2013-07-09 NOTE — Telephone Encounter (Signed)
Pt to be admitted to [facility] colitis. Pt knows to go to admitting at [facility]. Should go to [address] [place].

## 2013-07-09 NOTE — Progress Notes (Signed)
PARENTERAL NUTRITION CONSULT NOTE - INITIAL  Pharmacy Consult for TPN Indication: severe ulcerative colitis  Allergies  Allergen Reactions  . Cephalexin     REACTION: urticaria (hives)    Patient Measurements: Height: 6\' 2"  (188 cm) Weight: 133 lb 4.8 oz (60.464 kg) IBW/kg (Calculated) : 82.2 Adjusted Body Weight:  Usual Weight:   Appears he has lost 37# in last 7months  Vital Signs: Temp: 98.3 F (36.8 C) (11/24 1535) Temp src: Oral (11/24 1535) BP: 128/89 mmHg (11/24 1535) Pulse Rate: 93 (11/24 1535) Intake/Output from previous day:   Intake/Output from this shift:    Labs: No results found for this basename: WBC, HGB, HCT, PLT, APTT, INR,  in the last 72 hours  No results found for this basename: NA, K, CL, CO2, GLUCOSE, BUN, CREATININE, LABCREA, CREAT24HRUR, CALCIUM, MG, PHOS, PROT, ALBUMIN, AST, ALT, ALKPHOS, BILITOT, BILIDIR, IBILI, PREALBUMIN, TRIG, CHOLHDL, CHOL,  in the last 72 hours Estimated Creatinine Clearance: 111.3 ml/min (by C-G formula based on Cr of 0.7).   No results found for this basename: GLUCAP,  in the last 72 hours  Medical History: Past Medical History  Diagnosis Date  . Anxiety   . Ulcerative colitis   . Bipolar 1 disorder   . Insomnia   . ADHD (attention deficit hyperactivity disorder)   . Depression   . Excessive weight loss    Insulin Requirements in the past 24 hours:  No insulin orders  Nutritional Goals:  Estimated needs: Due to recent weight loss in last 7 months will start 1200-1500 kcals (20-25 kcals/kg for concerns of refeeding) and protein 72gm - 90gm  RD recs: Pending Clinimix 5/15 at a goal rate of 31ml/hr + 20% fat emulsion at 69ml/hr to provide: 84g/day protein, 1529Kcal/day.  Current Nutrition: poor intake prior to admission  IVF: 0.45% NaCl + KCl at 7ml/hr  Assessment: 72 YOM with h/o ulcerative colitis, he underwent colonoscopy 11/21 which revealed severe UC. Appears he continues to experience abd pain,  having 5-8 BM/day, and unable to eat.  He has been admitted to start TPN.   TPN day #1   Glucose - on methylprednisolone  Electrolytes - WNL  LFTs - WNL  TGs - order tomorrow am  Prealbumin - in process  TPN Access: Orders for PICC placement today  Plan:   Start ClinimixE 5/15 at 62ml/hr once PICC line placed (hoping PICC will be able to be placed this evening)  20% fat emulsion at 27ml/hr (lower rate to keep Fat Kcals <30% of total Kcals once at goal)  Plan to advance as tolerated to the goal rate, need to watch closely for refeeding syndrome d/t weight loss and poor PO intake.   Await RD assessment of nutritional needs  TNA will NOT contain MVI/TE as patient on oral multivitamin  Reduce IVF to 42ml/hr.  Add SSI (sensitive) and CBGs q6h .   TNA lab panels on Mondays & Thursdays.  F/u daily.   Juliette Alcide, PharmD, BCPS.   Pager: [phone]  07/09/2013,3:54 PM

## 2013-07-09 NOTE — H&P (Signed)
[facility] H & P  Primary Care Physician:  Sanda Linger, MD Primary Gastroenterologist:  Dr. Darrel Hoover  HPI: [name] is a 32 y.o. male who is a patient of Dr. Marzetta Board. He has severe ulcerative colitis of the entire colon. He was diagnosed at least 13 years ago. His last colonoscopy on 11/21 showed severe erythema and several areas of deep ulcerations throughout the colon. He is currently on Imuran 150 mg daily and Azulfidine as well. He had been on prednisone as an outpatient recently and that was increased on Friday. Dr. Marzetta Board plan was to see how he did over the weekend, but since he did not have much improvement it was decided that he should be brought into the hospital and managed as an inpatient. There has been discussion for him to start Remicade. A PPD was negative recently. He does appear very malnourished and says that he has not been eating much at all and has basically been surviving on yogurt. He has lost at least 30 pounds recently. Currently, on the prednisone, he is having 5-8 bowel movements a day without eating much at all. He sees intermittent blood in his stools. He does complain of abdominal pain and takes tramadol as needed for that. The pain can reach a 10 out of 10 without pain medication in the Tramadol usually brings it down to about a 6/10. There has been some issues of noncompliance in the past, however, Dr. Arlyce Dice recently spoke with him about potentially needing a colectomy if his disease continues to the severity.   Past Medical History  Diagnosis Date  . Anxiety   . Ulcerative colitis   . Bipolar 1 disorder   . Insomnia   . ADHD (attention deficit hyperactivity disorder)   . Depression   . Excessive weight loss     Past Surgical History  Procedure Laterality Date  . Colonoscopy w/ biopsies      hx ulcerative colitis  . Anal fissurectomy      Prior to Admission medications   Medication Sig Start Date End Date Taking? Authorizing  Provider  ALPRAZolam Prudy Feeler) 1 MG tablet Take 2 mg by mouth 3 (three) times daily as needed for anxiety. 05/15/12   Curlene Labrum Readling, MD  amphetamine-dextroamphetamine (ADDERALL XR) 30 MG 24 hr capsule Take 20 mg by mouth every 4 (four) hours. Please take on place of regular Adderall for ADHD symptoms. 05/15/12   Curlene Labrum Readling, MD  azaTHIOprine (IMURAN) 50 MG tablet TAKE (3) TABLETS DAILY. 06/19/13   Louis Meckel, MD  hyoscyamine (LEVBID) 0.375 MG 12 hr tablet TAKE 1 TABLET EVERY 12 HOURS AS NEEDED FOR CRAMPS. 05/05/13   Louis Meckel, MD  lamoTRIgine (LAMICTAL) 200 MG tablet Take 1 tablet (200 mg total) by mouth daily. For mood stabilization. 05/15/12   Ronny Bacon, MD  Multiple Vitamin (MULTIVITAMIN WITH MINERALS) TABS Take 1 tablet by mouth daily. As a nutritional supplement. 05/15/12   Curlene Labrum Readling, MD  sulfaSALAzine (AZULFIDINE) 500 MG tablet TAKE (2) TABLETS TWICE DAILY. 04/12/13   Louis Meckel, MD  traMADol (ULTRAM) 50 MG tablet Take 1 tablet (50 mg total) by mouth every 6 (six) hours as needed (take 1-2 tablets by mouth every 6 hours as needed for pain.). 07/05/13   Louis Meckel, MD  traZODone (DESYREL) 100 MG tablet Take 2 tablets (200 mg total) by mouth at bedtime as needed for sleep. 05/15/12   Ronny Bacon, MD  ziprasidone (GEODON)  40 MG capsule Please take 1 Capsule in the morning and 2 Capsules evening with food for mood stabilization. 05/15/12   Curlene Labrum Readling, MD  zolpidem (AMBIEN) 10 MG tablet Take 10 mg by mouth at bedtime as needed.    Historical Provider, MD    Current Facility-Administered Medications  Medication Dose Route Frequency Provider Last Rate Last Dose  . 0.45 % NaCl with KCl 20 mEq / L infusion   Intravenous Continuous Jessica D. Zehr, PA-C      . acetaminophen (TYLENOL) tablet 650 mg  650 mg Oral Q6H PRN Princella Pellegrini. Zehr, PA-C       Or  . acetaminophen (TYLENOL) suppository 650 mg  650 mg Rectal Q6H PRN Princella Pellegrini. Zehr, PA-C      . ALPRAZolam  Prudy Feeler) tablet 2 mg  2 mg Oral TID PRN Princella Pellegrini. Zehr, PA-C      . amphetamine-dextroamphetamine (ADDERALL) tablet 20 mg  20 mg Oral QID Jessica D. Zehr, PA-C      . [name] ON 07/10/2013] azaTHIOprine (IMURAN) tablet 150 mg  150 mg Oral Daily Jessica D. Zehr, PA-C      . HYDROcodone-acetaminophen (NORCO/VICODIN) 5-325 MG per tablet 1-2 tablet  1-2 tablet Oral Q4H PRN Princella Pellegrini. Zehr, PA-C      . hyoscyamine (LEVBID) 0.375 MG 12 hr tablet 0.375 mg  0.375 mg Oral Q12H PRN Princella Pellegrini. Zehr, PA-C      . [name] ON 07/10/2013] lamoTRIgine (LAMICTAL) tablet 200 mg  200 mg Oral Daily Jessica D. Zehr, PA-C      . methylPREDNISolone sodium succinate (SOLU-MEDROL) 40 mg/mL injection 40 mg  40 mg Intravenous Daily Jessica D. Zehr, PA-C      . [name] ON 07/10/2013] multivitamin with minerals tablet 1 tablet  1 tablet Oral Daily Jessica D. Zehr, PA-C      . ondansetron (ZOFRAN) tablet 4 mg  4 mg Oral Q6H PRN Princella Pellegrini. Zehr, PA-C       Or  . ondansetron (ZOFRAN) injection 4 mg  4 mg Intravenous Q6H PRN Jessica D. Zehr, PA-C      . sulfaSALAzine (AZULFIDINE) tablet 1,500 mg  1,500 mg Oral BID Shanda Bumps D. Zehr, PA-C      . traZODone (DESYREL) tablet 200 mg  200 mg Oral QHS PRN Princella Pellegrini. Zehr, PA-C      . ziprasidone (GEODON) capsule 40 mg  40 mg Oral QPM Louis Meckel, MD      . [name] ON 07/10/2013] ziprasidone (GEODON) capsule 80 mg  80 mg Oral Q breakfast Jessica D. Zehr, PA-C      . zolpidem (AMBIEN) tablet 10 mg  10 mg Oral QHS PRN Princella Pellegrini. Zehr, PA-C        Allergies as of 07/09/2013 - Review Complete 07/09/2013  Allergen Reaction Noted  . Cephalexin  09/27/2006    Family History  Problem Relation Age of Onset  . Lymphoma Paternal Grandmother   . Leukemia Maternal Grandmother   . Colon cancer Neg Hx     History   Social History  . Marital Status: Single    Spouse Name: N/A    Number of Children: 0  . Years of Education: N/A   Occupational History  . Counselling psychologist    Social  History Main Topics  . Smoking status: Never Smoker   . Smokeless tobacco: Never Used  . Alcohol Use: No  . Drug Use: No  . Sexual Activity: No   Other Topics Concern  .  Not on file   Social History Narrative  . No narrative on file    Review of Systems:  Ten point ROS is O/W negative except as mentioned in HPI.  Physical Exam: Vital signs in last 24 hours: Temp:  [98.3 F (36.8 C)] 98.3 F (36.8 C) (11/24 1535) Pulse Rate:  [93] 93 (11/24 1535) Resp:  [16] 16 (11/24 1535) BP: (128)/(89) 128/89 mmHg (11/24 1535) SpO2:  [97 %] 97 % (11/24 1535) Weight:  [133 lb 4.8 oz (60.464 kg)] 133 lb 4.8 oz (60.464 kg) (11/24 1535)   General:   Alert, very thin and emaciated, pleasant and cooperative in NAD Head:  Normocephalic and atraumatic. Eyes:  Sclera clear, no icterus.  Conjunctiva pink. Ears:  Normal auditory acuity. Mouth:  No deformity or lesions.   Lungs:  Clear throughout to auscultation.  No wheezes, crackles, or rhonchi.  Heart:  Regular rate and rhythm.  No M/R/G. Abdomen:  Soft, non-distended.  BS present.  Mild diffuse TTP without R/R/G.   Rectal:  Deferred  Msk:  Symmetrical without gross deformities. Pulses:  Normal pulses noted. Extremities:  Without clubbing or edema. Neurologic:  Alert and  oriented x4;  grossly normal neurologically. Skin:  Intact without significant lesions or rashes. Psych:  Alert and cooperative. Normal mood and affect.  IMPRESSION:  -Severe universal ulcerative colitis -ADHD, anxiety/depression, and bipolar disorder  PLAN: -Will admit to medical floor.  PICC line with IVF's.  Nutrition and pharmacy consults for TNA.  IV solumedrol.  Continue other home medications for now.  Stool for Cdiff.  Will check CBC, CMP, magnesium, phosphorus, pre-albumin, and HepBsAg (in anticipation for Remicade).    ZEHR, JESSICA D.  07/09/2013, 5:10 PM  Pager number [phone]    Attending physician's note   I have taken a history, examined the patient  and reviewed the chart. I agree with the Advanced Practitioner's note, impression and recommendations. Severe universal UC with severe protein calorie malnutrition. Begin TNA and IV Solumedrol once IV access is established. Anticipate Remicade therapy to start in hospital.   Meryl Dare, MD [facility]

## 2013-07-09 NOTE — Telephone Encounter (Signed)
  Follow up Call-  Call back number 07/06/2013 07/27/2012 04/28/2011 03/19/2011  Post procedure Call Back phone  # [phone]  Permission to leave phone message Yes Yes - -     Patient questions:  Do you have a fever, pain , or abdominal swelling? yes Pain Score  8 *  Have you tolerated food without any problems? no  Have you been able to return to your normal activities? no  Do you have any questions about your discharge instructions: Diet   no Medications  no Follow up visit  no  Do you have questions or concerns about your Care? yes  Actions: * If pain score is 4 or above: Physician/ provider Notified : Melvia Heaps, MD. Dr. Arlyce Dice advised that pt. Having frequent loose stools, 5-8 daily.  Poor appetite, no fever, abdominal Pain and fatigue.

## 2013-07-09 NOTE — Telephone Encounter (Signed)
Dr. Arlyce Dice will arrange for pt. To be admitted to hosptital.

## 2013-07-09 NOTE — Progress Notes (Signed)
Peripherally Inserted Central Catheter/Midline Placement  The IV Nurse has discussed with the patient and/or persons authorized to consent for the patient, the purpose of this procedure and the potential benefits and risks involved with this procedure.  The benefits include less needle sticks, lab draws from the catheter and patient may be discharged home with the catheter.  Risks include, but not limited to, infection, bleeding, blood clot (thrombus formation), and puncture of an artery; nerve damage and irregular heat beat.  Alternatives to this procedure were also discussed.  PICC/Midline Placement Documentation        Lisabeth Devoid 07/09/2013, 6:12 PM Consent obtained by Merleen Milliner, RN, CRNI

## 2013-07-10 LAB — COMPREHENSIVE METABOLIC PANEL
ALT: 9 U/L (ref 0–53)
AST: 9 U/L (ref 0–37)
CO2: 28 mEq/L (ref 19–32)
Calcium: 8.7 mg/dL (ref 8.4–10.5)
Chloride: 96 mEq/L (ref 96–112)
Creatinine, Ser: 0.59 mg/dL (ref 0.50–1.35)
GFR calc Af Amer: >90 mL/min (ref >90)
GFR calc non Af Amer: >90 mL/min (ref >90)
Glucose, Bld: 136 mg/dL — ABNORMAL HIGH (ref 70–99)
Potassium: 4 mEq/L (ref 3.5–5.1)
Sodium: 132 mEq/L — ABNORMAL LOW (ref 135–145)
Total Bilirubin: 0.4 mg/dL (ref 0.3–1.2)

## 2013-07-10 LAB — TRIGLYCERIDES: Triglycerides: 49 mg/dL (ref <150)

## 2013-07-10 LAB — GLUCOSE, CAPILLARY
Glucose-Capillary: 125 mg/dL — ABNORMAL HIGH (ref 70–99)
Glucose-Capillary: 130 mg/dL — ABNORMAL HIGH (ref 70–99)

## 2013-07-10 MED ORDER — METHYLPREDNISOLONE SODIUM SUCC 40 MG IJ SOLR
40.0000 mg | Freq: Two times a day (BID) | INTRAMUSCULAR | Status: DC
  Administered 2013-07-10 – 2013-07-12 (×6): 40 mg via INTRAVENOUS
  Filled 2013-07-10 (×7): qty 1

## 2013-07-10 MED ORDER — CLINIMIX E/DEXTROSE (5/20) 5 % IV SOLN
INTRAVENOUS | Status: AC
  Administered 2013-07-10: 19:00:00 via INTRAVENOUS
  Filled 2013-07-10: qty 2000

## 2013-07-10 MED ORDER — FAT EMULSION 20 % IV EMUL
250.0000 mL | INTRAVENOUS | Status: AC
  Administered 2013-07-10: 250 mL via INTRAVENOUS
  Filled 2013-07-10: qty 250

## 2013-07-10 MED ORDER — POTASSIUM CHLORIDE IN NACL 20-0.45 MEQ/L-% IV SOLN
INTRAVENOUS | Status: DC
  Administered 2013-07-10: 25 mL/h via INTRAVENOUS
  Administered 2013-07-11: 1000 mL via INTRAVENOUS
  Filled 2013-07-10: qty 1000

## 2013-07-10 NOTE — Progress Notes (Signed)
PARENTERAL NUTRITION CONSULT NOTE - INITIAL  Pharmacy Consult for TPN Indication: severe ulcerative colitis  Allergies  Allergen Reactions  . Cephalexin     REACTION: urticaria (hives)    Patient Measurements: Height: 6\' 2"  (188 cm) Weight: 133 lb 4.8 oz (60.464 kg) IBW/kg (Calculated) : 82.2 Adjusted Body Weight:  Usual Weight:   Appears he has lost 37# in last 7months  Vital Signs: Temp: 97.8 F (36.6 C) (11/25 0501) Temp src: Oral (11/25 0501) BP: 101/67 mmHg (11/25 0501) Pulse Rate: 74 (11/25 0501) Intake/Output from previous day: 11/24 0701 - 11/25 0700 In: 0  Out: 750 [Urine:750] Intake/Output from this shift: Total I/O In: 0  Out: 300 [Urine:300]  Labs:  Recent Labs  07/09/13 1559  WBC 4.2  HGB 12.1*  HCT 35.3*  PLT 445*  INR 1.03     Recent Labs  07/09/13 1559 07/10/13 0500  NA 136 132*  K 4.2 4.0  CL 98 96  CO2 28 28  GLUCOSE 121* 136*  BUN 11 13  CREATININE 0.81 0.59  CALCIUM 9.5 8.7  MG 2.0 2.3  PHOS 4.4 4.6  PROT 7.8 6.9  ALBUMIN 3.6 3.0*  AST 13 9  ALT 12 9  ALKPHOS 76 65  BILITOT 0.4 0.4  PREALBUMIN 21.3  --   TRIG  --  49   Estimated Creatinine Clearance: 111.3 ml/min (by C-G formula based on Cr of 0.59).    Recent Labs  07/10/13 0026 07/10/13 0630  GLUCAP 132* 126*    Medical History: Past Medical History  Diagnosis Date  . Anxiety   . Ulcerative colitis   . Bipolar 1 disorder   . Insomnia   . ADHD (attention deficit hyperactivity disorder)   . Depression   . Excessive weight loss    Insulin Requirements in the past 24 hours:  Sensitive SSI ordered - 0 units given.    Nutritional Goals:  RD recs:  Kcal: 2250-2550  Protein: 110-130g  Fluid: 2.2-2.5L/day  Clinimix E 5/20 at a goal rate of 100 ml/hr + 20% fat emulsion at 10 ml/hr to provide: 120 g/day protein, 2592 Kcal/day.  Current Nutrition: poor intake prior to admission  IVF: 0.45% NaCl + KCl at 40 ml/hr  Assessment: 46 YOM with h/o  ulcerative colitis, he underwent colonoscopy 11/21 which revealed severe UC. Appears he continues to experience abd pain, having 5-8 BM/day, and unable to eat.  He has been admitted to start TPN secondary to severe malnutrition evidenced by severe muscle wasting and subcutaneous fat loss throughout body with 27% weight loss in the past 7-8 months per pt report (50 lb unintentional weight loss).  Plan per GI is to continue TNA and bowel rest, increase solumedrol to 40 mg BID and plan to start Remicade 11/26.  Awaiting Biopsy results from colonoscopy on 11/21  TPN day #2   Glucose - on methylprednisolone will continue to monitor, within goal range today  Electrolytes - WNL  LFTs - WNL  TGs - 49 (11/25)  Prealbumin - 21.3 (11/24)  TPN Access: PICC placed on 11/24  Plan:   Change Clinimix E 5/20 and increase  to 50 ml/hr.  Continue 20% Fat emulsion but increase rate to 40ml/hr (This will maintain fat kCals < 30% of total Kcals once at new goal).    Plan to advance as tolerated to the goal rate, need to watch closely for refeeding syndrome d/t weight loss and poor PO intake  TNA will NOT contain MVI/TE as patient  on oral multivitamin  Reduce IVF to 25 ml/hr tonight at 1800   Continue SSI (sensitive) and CBGs q6h   TNA lab panels on Mondays & Thursdays.  BorgerdingLoma Messing PharmD Pager #: [phone] 11:29 AM 07/10/2013

## 2013-07-10 NOTE — Progress Notes (Signed)
[facility] Progress Note  Subjective:  No BM but gets sensation that he needs to go.  Vicodin a lot with the abdominal pain.  Objective:  Vital signs in last 24 hours: Temp:  [97.8 F (36.6 C)-98.5 F (36.9 C)] 97.8 F (36.6 C) (11/25 0501) Pulse Rate:  [74-93] 74 (11/25 0501) Resp:  [16] 16 (11/25 0501) BP: (101-128)/(67-89) 101/67 mmHg (11/25 0501) SpO2:  [94 %-98 %] 94 % (11/25 0501) Weight:  [133 lb 4.8 oz (60.464 kg)] 133 lb 4.8 oz (60.464 kg) (11/24 1535) Last BM Date: 07/09/13 General:  Alert, very thin, in NAD Heart:  Regular rate and rhythm; no murmurs Pulm:  CTAB.  No W/R/R. Abdomen:  Soft, non-distended.  BS present.  Non-tender.  Extremities:  Without edema. Neurologic:  Alert and  oriented x4;  grossly normal neurologically. Psych:  Alert and cooperative. Normal mood and affect.  Intake/Output from previous day: 11/24 0701 - 11/25 0700 In: 0  Out: 750 [Urine:750] Intake/Output this shift: Total I/O In: 0  Out: 300 [Urine:300]  Lab Results:  Recent Labs  07/09/13 1559  WBC 4.2  HGB 12.1*  HCT 35.3*  PLT 445*   BMET  Recent Labs  07/09/13 1559 07/10/13 0500  NA 136 132*  K 4.2 4.0  CL 98 96  CO2 28 28  GLUCOSE 121* 136*  BUN 11 13  CREATININE 0.81 0.59  CALCIUM 9.5 8.7   LFT  Recent Labs  07/10/13 0500  PROT 6.9  ALBUMIN 3.0*  AST 9  ALT 9  ALKPHOS 65  BILITOT 0.4   PT/INR  Recent Labs  07/09/13 1559  LABPROT 13.3  INR 1.03   Hepatitis Panel  Recent Labs  07/09/13 1559  HEPBSAG NEGATIVE   Assessment / Plan: -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  Anticipate starting Remicade on 11/26.  Will increase solumedrol to 40mg  IV q12 hours for now.  Continue Imuran and Azulfidine as well. -ADHD, anxiety/depression, and bipolar disorder:  Continue home meds.    LOS: 1 day   ZEHR, JESSICA D.  07/10/2013, 9:24 AM  Pager number [phone]     Attending physician's note   I have taken an interval history, reviewed the chart and examined the patient. I agree with the Advanced Practitioner's note, impression and recommendations. Continue TNA and bowel rest. Increase Solumedrol to 40 mg q12h and plan to start Remicade tomorrow. Awaiting biopsies from colonoscopy on 11/21.  Venita Lick. Russella Dar, MD [facility]

## 2013-07-10 NOTE — Progress Notes (Signed)
No bm since before the 24th. Pt is NPO. Unable to send stool for PCR.

## 2013-07-10 NOTE — Progress Notes (Signed)
INITIAL NUTRITION ASSESSMENT  Pt meets criteria for severe MALNUTRITION in the context of chronic illness as evidenced by severe muscle wasting and subcutaneous fat loss throughout body with 27% weight loss in the past 7-8 months per pt report with <50% estimated energy intake for the past 3 months.  DOCUMENTATION CODES Per approved criteria  -Severe malnutrition in the context of chronic illness -Underweight   INTERVENTION: - TPN per pharmacy - Diet advancement per MD - Will continue to monitor   NUTRITION DIAGNOSIS: Inadequate oral intake related to inability to eat as evidenced by NPO.   Goal: TPN to meet >90% of estimated nutritional needs  Monitor:  Weights, labs, diet advancement, TPN, diarrhea  Reason for Assessment: Consult for TPN  32 y.o. male  Admitting Dx: Ulcerative colitis   ASSESSMENT: Pt discussed during multidisciplinary rounds. Pt with severe ulcerative colitis of the entire colon. He was diagnosed at least 13 years ago. His last colonoscopy on 11/21 showed severe erythema and several areas of deep ulcerations throughout the colon. Currently, on the prednisone, he is having 5-8 bowel movements a day without eating much at all. He sees intermittent blood in his stools.  Met with pt who reports 50 pound unintended weight loss in the past 7-8 months, 15 pounds in the past 34 days.  Pt reports for the past 3 months he has been living of off 2-3 containers of 6 ounces of Greek yogurt (around 120 calories, 12g protein). Reports PTA having 10-15 episodes of diarrhea today, denies any today. Pt's biggest complaint is constant fatigue. Plan is for bowel rest with TPN.   TPN: Clinimix E 5/15 @ 35 ml/hr and lipids @ 7 ml/hr. Provides 1529 kcal, and 84 grams protein per day. Meets 68% minimum estimated energy needs and 76% minimum estimated protein needs.  Sodium slightly low Potassium, magnesium, and phosphorus WNL PALB WNL  Nutrition Focused Physical  Exam:  Subcutaneous Fat:  Orbital Region: severe wasting Upper Arm Region: severe wasting Thoracic and Lumbar Region: severe wasting  Muscle:  Temple Region: severe wasting Clavicle Bone Region: severe wasting Clavicle and Acromion Bone Region: severe wasting Scapular Bone Region: severe wasting Dorsal Hand: severe wasting Patellar Region: severe wasting    Anterior Thigh Region: severe wasting Posterior Calf Region: severe wasting  Edema: None noted      Height: Ht Readings from Last 1 Encounters:  07/09/13 6\' 2"  (1.88 m)    Weight: Wt Readings from Last 1 Encounters:  07/09/13 133 lb 4.8 oz (60.464 kg)    Ideal Body Weight: 190 lb   % Ideal Body Weight: 70%  Wt Readings from Last 10 Encounters:  07/09/13 133 lb 4.8 oz (60.464 kg)  07/06/13 146 lb (66.225 kg)  06/19/13 146 lb 12.8 oz (66.588 kg)  06/05/13 147 lb 2 oz (66.735 kg)  10/07/12 184 lb (83.462 kg)  07/27/12 181 lb (82.101 kg)  07/19/12 181 lb (82.101 kg)  05/11/12 175 lb (79.379 kg)  05/05/12 183 lb (83.008 kg)  10/18/11 172 lb 12.8 oz (78.382 kg)    Usual Body Weight: 183 lb per pt  % Usual Body Weight: 73%  BMI:  Body mass index is 17.11 kg/(m^2). Underweight  Estimated Nutritional Needs: Kcal: 2250-2550 Protein: 110-130g Fluid: 2.2-2.5L/day  Skin: intact  Diet Order: NPO  EDUCATION NEEDS: -No education needs identified at this time   Intake/Output Summary (Last 24 hours) at 07/10/13 0919 Last data filed at 07/10/13 0825  Gross per 24 hour  Intake  0 ml  Output   1050 ml  Net  -1050 ml    Last BM: 11/24  Labs:   Recent Labs Lab 07/09/13 1559 07/10/13 0500  NA 136 132*  K 4.2 4.0  CL 98 96  CO2 28 28  BUN 11 13  CREATININE 0.81 0.59  CALCIUM 9.5 8.7  MG 2.0 2.3  PHOS 4.4 4.6  GLUCOSE 121* 136*    CBG (last 3)   Recent Labs  07/10/13 0026 07/10/13 0630  GLUCAP 132* 126*    Scheduled Meds: . amphetamine-dextroamphetamine  20 mg Oral QID  .  azaTHIOprine  150 mg Oral Daily  . insulin aspart  0-9 Units Subcutaneous Q6H  . lamoTRIgine  200 mg Oral Daily  . methylPREDNISolone (SOLU-MEDROL) injection  40 mg Intravenous Daily  . multivitamin with minerals  1 tablet Oral Daily  . sulfaSALAzine  1,500 mg Oral BID  . ziprasidone  40 mg Oral QPM  . ziprasidone  80 mg Oral Q breakfast    Continuous Infusions: . 0.45 % NaCl with KCl 20 mEq / L 40 mL/hr at 07/09/13 2232  . fat emulsion 250 mL (07/09/13 2132)   And  . Marland KitchenTPN (CLINIMIX-E) Adult 35 mL/hr at 07/09/13 2131    Past Medical History  Diagnosis Date  . Anxiety   . Ulcerative colitis   . Bipolar 1 disorder   . Insomnia   . ADHD (attention deficit hyperactivity disorder)   . Depression   . Excessive weight loss     Past Surgical History  Procedure Laterality Date  . Colonoscopy w/ biopsies      hx ulcerative colitis  . Anal fissurectomy      Levon Hedger MS, RD, LDN [phone] Pager [phone] After Hours Pager

## 2013-07-10 NOTE — Care Management Note (Signed)
   CARE MANAGEMENT NOTE 07/10/2013  Patient:  [name], [name]   Account Number:  [id number]  Date Initiated:  07/10/2013  Documentation initiated by:  Kaleigha Chamberlin  Subjective/Objective Assessment:   32 yo male admitted with ulcerative colitis. PTA pt from home.     Action/Plan:   Home when stable   Anticipated DC Date:     Anticipated DC Plan:  HOME/SELF CARE      DC Planning Services  CM consult      Choice offered to / List presented to:  NA   DME arranged  NA      DME agency  NA     HH arranged  NA      HH agency  NA   Status of service:  In process, will continue to follow Medicare Important Message given?   (If response is "NO", the following Medicare IM given date fields will be blank) Date Medicare IM given:   Date Additional Medicare IM given:    Discharge Disposition:    Per UR Regulation:  Reviewed for med. necessity/level of care/duration of stay  If discussed at Long Length of Stay Meetings, dates discussed:    Comments:  07/10/13 1218 Roxy Manns Cecilee Rosner,RN,MSN [phone] Chart reviewed for utilization of services. No needs identified at this time.

## 2013-07-11 DIAGNOSIS — F411 Generalized anxiety disorder: Secondary | ICD-10-CM

## 2013-07-11 LAB — GLUCOSE, CAPILLARY
Glucose-Capillary: 205 mg/dL — ABNORMAL HIGH (ref 70–99)
Glucose-Capillary: 137 mg/dL — ABNORMAL HIGH (ref 70–99)
Glucose-Capillary: 133 mg/dL — ABNORMAL HIGH (ref 70–99)

## 2013-07-11 LAB — BASIC METABOLIC PANEL
CO2: 28 mEq/L (ref 19–32)
Chloride: 99 mEq/L (ref 96–112)
Creatinine, Ser: 0.61 mg/dL (ref 0.50–1.35)
GFR calc non Af Amer: >90 mL/min (ref >90)
Glucose, Bld: 155 mg/dL — ABNORMAL HIGH (ref 70–99)
Potassium: 4.3 mEq/L (ref 3.5–5.1)
Sodium: 134 mEq/L — ABNORMAL LOW (ref 135–145)

## 2013-07-11 LAB — PHOSPHORUS: Phosphorus: 3.5 mg/dL (ref 2.3–4.6)

## 2013-07-11 MED ORDER — DIPHENHYDRAMINE HCL 25 MG PO CAPS
25.0000 mg | ORAL_CAPSULE | Freq: Once | ORAL | Status: AC
  Administered 2013-07-11: 25 mg via ORAL
  Filled 2013-07-11: qty 1

## 2013-07-11 MED ORDER — POTASSIUM CHLORIDE IN NACL 20-0.45 MEQ/L-% IV SOLN
INTRAVENOUS | Status: AC
  Filled 2013-07-11: qty 1000

## 2013-07-11 MED ORDER — AMPHETAMINE-DEXTROAMPHETAMINE 20 MG PO TABS
20.0000 mg | ORAL_TABLET | ORAL | Status: DC
  Administered 2013-07-11 – 2013-07-22 (×45): 20 mg via ORAL
  Filled 2013-07-11 (×45): qty 1

## 2013-07-11 MED ORDER — CLINIMIX E/DEXTROSE (5/20) 5 % IV SOLN
INTRAVENOUS | Status: AC
  Administered 2013-07-11: 18:00:00 via INTRAVENOUS
  Filled 2013-07-11: qty 2000

## 2013-07-11 MED ORDER — ACETAMINOPHEN 325 MG PO TABS
650.0000 mg | ORAL_TABLET | Freq: Once | ORAL | Status: AC
  Administered 2013-07-11: 650 mg via ORAL
  Filled 2013-07-11: qty 2

## 2013-07-11 MED ORDER — FAT EMULSION 20 % IV EMUL
250.0000 mL | INTRAVENOUS | Status: AC
  Administered 2013-07-11: 250 mL via INTRAVENOUS
  Filled 2013-07-11: qty 250

## 2013-07-11 MED ORDER — AMPHETAMINE-DEXTROAMPHETAMINE 20 MG PO TABS
20.0000 mg | ORAL_TABLET | ORAL | Status: DC
  Administered 2013-07-11: 20 mg via ORAL
  Filled 2013-07-11: qty 1

## 2013-07-11 MED ORDER — SODIUM CHLORIDE 0.9 % IV SOLN
5.0000 mg/kg | Freq: Once | INTRAVENOUS | Status: AC
  Administered 2013-07-11: 300 mg via INTRAVENOUS
  Filled 2013-07-11: qty 30

## 2013-07-11 NOTE — Progress Notes (Signed)
PARENTERAL NUTRITION CONSULT NOTE - INITIAL  Pharmacy Consult for TPN Indication: severe ulcerative colitis  Allergies  Allergen Reactions  . Cephalexin     REACTION: urticaria (hives)    Patient Measurements: Height: 6\' 2"  (188 cm) Weight: 131 lb 6.3 oz (59.6 kg) IBW/kg (Calculated) : 82.2 Adjusted Body Weight:  Usual Weight:   Appears he has lost 37# in last 7months  Vital Signs: Temp: 97.8 F (36.6 C) (11/26 0511) Temp src: Oral (11/26 0511) BP: 112/65 mmHg (11/26 0511) Pulse Rate: 102 (11/26 0511) Intake/Output from previous day: 11/25 0701 - 11/26 0700 In: 1356 [I.V.:450; TPN:906] Out: 2224 [Urine:2224] Intake/Output from this shift:    Labs:  Recent Labs  07/09/13 1559  WBC 4.2  HGB 12.1*  HCT 35.3*  PLT 445*  INR 1.03     Recent Labs  07/09/13 1559 07/10/13 0500 07/11/13 0551  NA 136 132* 134*  K 4.2 4.0 4.3  CL 98 96 99  CO2 28 28 28   GLUCOSE 121* 136* 155*  BUN 11 13 12   CREATININE 0.81 0.59 0.61  CALCIUM 9.5 8.7 9.0  MG 2.0 2.3 2.1  PHOS 4.4 4.6 3.5  PROT 7.8 6.9  --   ALBUMIN 3.6 3.0*  --   AST 13 9  --   ALT 12 9  --   ALKPHOS 76 65  --   BILITOT 0.4 0.4  --   PREALBUMIN 21.3  --   --   TRIG  --  49  --    Estimated Creatinine Clearance: 109.7 ml/min (by C-G formula based on Cr of 0.61).    Recent Labs  07/10/13 1821 07/11/13 0016 07/11/13 0507  GLUCAP 130* 112* 397*    Medical History: Past Medical History  Diagnosis Date  . Anxiety   . Ulcerative colitis   . Bipolar 1 disorder   . Insomnia   . ADHD (attention deficit hyperactivity disorder)   . Depression   . Excessive weight loss    Insulin Requirements in the past 24 hours:  Sensitive SSI ordered - 12 units (Patient received 1 unit with 2 CBG checks, and 9 units with a CBG check this AM, patient confesses to having drank a soda).    Nutritional Goals:  RD recs:  Kcal: 2250-2550  Protein: 110-130g  Fluid: 2.2-2.5L/day  Clinimix E 5/20 at a goal rate of  100 ml/hr + 20% fat emulsion at 10 ml/hr to provide: 120 g/day protein, 2592 Kcal/day.  Current Nutrition: poor intake prior to admission  IVF: 0.45% NaCl + KCl at 25 ml/hr  Assessment: 33 YOM with h/o ulcerative colitis, he underwent colonoscopy 11/21 which revealed severe UC. Appears he continues to experience abd pain, having 5-8 BM/day, and unable to eat.  He has been admitted to start TPN secondary to severe malnutrition evidenced by severe muscle wasting and subcutaneous fat loss throughout body with 27% weight loss in the past 7-8 months per pt report (50 lb unintentional weight loss).  Plan per GI is to continue TNA and bowel rest, increase solumedrol to 40 mg BID and plan to start Remicade 11/26.  Awaiting Biopsy results from colonoscopy on 11/21  TPN day #3   Glucose - CBG's have been within goal range (<150) 112-130 - However 1 CBG this morning was elevated to 397 - The patient confesses to drinking an entire Soda right before the CBG check - This is very likely the cause of the single elevated CBG.  Will repeat check at 0830  this AM.     today  Electrolytes - WNL  LFTs - WNL  TGs - 49 (11/25)  Prealbumin - 21.3 (11/24)  TPN Access: PICC placed on 11/24  Plan:   Increase Clinimix E 5/20 and increase  to 60 ml/hr.  Continue 20% Fat emulsion but increase rate to 9ml/hr (This will maintain fat kCals < 30% of total Kcals once at new goal).  Will slowly/conservatively advance TNA to goal rate given risk of re-feeding syndrome.    Plan to advance as tolerated to the goal rate, need to watch closely for refeeding syndrome d/t weight loss and poor PO intake  TNA will NOT contain MVI/TE as patient on oral multivitamin  Reduce IVF to [facility] tonight at 1800   Continue SSI (sensitive) and CBGs q6h; Repeat CBG this AM  TNA lab panels on Mondays & Thursdays.  Argelio Granier, Loma Messing PharmD Pager #: [phone] 8:00 AM 07/11/2013

## 2013-07-11 NOTE — Progress Notes (Addendum)
    Progress Note   Subjective  Main complaint is fatigue. Diarrhea significantly better. He does still have lower abdominal discomfort but that is manageable with pain meds. He is demonstrating some obsessive traits. [name] entering meds given to him in his laptop.   Objective   Vital signs in last 24 hours: Temp:  [97.5 F (36.4 C)-97.8 F (36.6 C)] 97.8 F (36.6 C) (11/26 0511) Pulse Rate:  [76-102] 102 (11/26 0511) Resp:  [16] 16 (11/26 0511) BP: (91-112)/(55-66) 112/65 mmHg (11/26 0511) SpO2:  [97 %-100 %] 99 % (11/26 0511) Weight:  [131 lb 6.3 oz (59.6 kg)] 131 lb 6.3 oz (59.6 kg) (11/26 0511) Last BM Date: 07/09/13 General:    white male in NAD Heart:  Regular rate and rhythm Lungs: Respirations even and unlabored, lungs CTA bilaterally Abdomen:  Soft, nontender and nondistended. Normal bowel sounds. Extremities:  Without edema. Neurologic:  Alert and oriented,  grossly normal neurologically. Psych:  Cooperative. Anxious about all aspects of care, some obsessive behavior   Lab Results:  Recent Labs  07/09/13 1559  WBC 4.2  HGB 12.1*  HCT 35.3*  PLT 445*   BMET  Recent Labs  07/09/13 1559 07/10/13 0500 07/11/13 0551  NA 136 132* 134*  K 4.2 4.0 4.3  CL 98 96 99  CO2 28 28 28   GLUCOSE 121* 136* 155*  BUN 11 13 12   CREATININE 0.81 0.59 0.61  CALCIUM 9.5 8.7 9.0   LFT  Recent Labs  07/10/13 0500  PROT 6.9  ALBUMIN 3.0*  AST 9  ALT 9  ALKPHOS 65  BILITOT 0.4   PT/INR  Recent Labs  07/09/13 1559  LABPROT 13.3  INR 1.03      Assessment / Plan:   1. Severe universal ulcerative colitis. Improving. Continue Solumedrol, Imuran and Azulfidine, for 1st dose of Remicade today. HB surface Ag negative. HCV Ab negative. TB skin test negative.   2. Malnutrition. Prealbumin actually normal. On TNA, bowel rest.   3. ADD / history of psychosis. On Geodon, Lamictal and Adderall.    LOS: 2 days   [name]  07/11/2013, 8:58 AM    Attending  physician's note   I have taken an interval history, reviewed the chart and examined the patient. I agree with the Advanced Practitioner's note, impression and recommendations. Severe ulcerative colitis is improving. Starting Remicade today. Discussed likely hospital stay of 7-10 days but his could easily vary.  Venita Lick. Russella Dar, MD [facility]

## 2013-07-12 LAB — COMPREHENSIVE METABOLIC PANEL
ALT: 8 U/L (ref 0–53)
Alkaline Phosphatase: 54 U/L (ref 39–117)
BUN: 11 mg/dL (ref 6–23)
CO2: 29 mEq/L (ref 19–32)
Calcium: 8.9 mg/dL (ref 8.4–10.5)
Chloride: 100 mEq/L (ref 96–112)
GFR calc Af Amer: >90 mL/min (ref >90)
Glucose, Bld: 160 mg/dL — ABNORMAL HIGH (ref 70–99)
Potassium: 4.2 mEq/L (ref 3.5–5.1)
Sodium: 135 mEq/L (ref 135–145)
Total Bilirubin: 0.3 mg/dL (ref 0.3–1.2)
Total Protein: 6.7 g/dL (ref 6.0–8.3)

## 2013-07-12 LAB — CLOSTRIDIUM DIFFICILE BY PCR: Toxigenic C. Difficile by PCR: NEGATIVE

## 2013-07-12 LAB — GLUCOSE, CAPILLARY
Glucose-Capillary: 154 mg/dL — ABNORMAL HIGH (ref 70–99)
Glucose-Capillary: 106 mg/dL — ABNORMAL HIGH (ref 70–99)
Glucose-Capillary: 114 mg/dL — ABNORMAL HIGH (ref 70–99)

## 2013-07-12 LAB — MAGNESIUM: Magnesium: 2.4 mg/dL (ref 1.5–2.5)

## 2013-07-12 MED ORDER — FAT EMULSION 20 % IV EMUL
250.0000 mL | INTRAVENOUS | Status: AC
  Administered 2013-07-12: 250 mL via INTRAVENOUS
  Filled 2013-07-12: qty 250

## 2013-07-12 MED ORDER — HYOSCYAMINE SULFATE ER 0.375 MG PO TB12
0.3750 mg | ORAL_TABLET | Freq: Two times a day (BID) | ORAL | Status: DC
  Administered 2013-07-12 – 2013-07-22 (×21): 0.375 mg via ORAL
  Filled 2013-07-12 (×22): qty 1

## 2013-07-12 MED ORDER — CLINIMIX E/DEXTROSE (5/20) 5 % IV SOLN
INTRAVENOUS | Status: AC
  Administered 2013-07-12: 17:00:00 via INTRAVENOUS
  Filled 2013-07-12: qty 2000

## 2013-07-12 NOTE — Progress Notes (Signed)
PARENTERAL NUTRITION CONSULT NOTE - INITIAL  Pharmacy Consult for TPN Indication: severe ulcerative colitis  Allergies  Allergen Reactions  . Cephalexin     REACTION: urticaria (hives)    Patient Measurements: Height: 6\' 2"  (188 cm) Weight: 131 lb 6.3 oz (59.6 kg) IBW/kg (Calculated) : 82.2 Adjusted Body Weight:  Usual Weight:   Appears he has lost 37# in last 7months  Vital Signs: Temp: 97.7 F (36.5 C) (11/27 0629) Temp src: Oral (11/27 0629) BP: 95/48 mmHg (11/27 0629) Pulse Rate: 72 (11/27 0629) Intake/Output from previous day: 11/26 0701 - 11/27 0700 In: 2266 [ION:6295] Out: 2150 [Urine:2150] Intake/Output from this shift:    Labs:  Recent Labs  07/09/13 1559  WBC 4.2  HGB 12.1*  HCT 35.3*  PLT 445*  INR 1.03     Recent Labs  07/09/13 1559 07/10/13 0500 07/11/13 0551 07/12/13 0420  NA 136 132* 134* 135  K 4.2 4.0 4.3 4.2  CL 98 96 99 100  CO2 28 28 28 29   GLUCOSE 121* 136* 155* 160*  BUN 11 13 12 11   CREATININE 0.81 0.59 0.61 0.57  CALCIUM 9.5 8.7 9.0 8.9  MG 2.0 2.3 2.1 2.4  PHOS 4.4 4.6 3.5 3.7  PROT 7.8 6.9  --  6.7  ALBUMIN 3.6 3.0*  --  3.1*  AST 13 9  --  7  ALT 12 9  --  8  ALKPHOS 76 65  --  54  BILITOT 0.4 0.4  --  0.3  PREALBUMIN 21.3  --   --   --   TRIG  --  49  --   --    Estimated Creatinine Clearance: 109.7 ml/min (by C-G formula based on Cr of 0.57).    Recent Labs  07/11/13 1818 07/11/13 2332 07/12/13 0550  GLUCAP 133* 133* 154*    Medical History: Past Medical History  Diagnosis Date  . Anxiety   . Ulcerative colitis   . Bipolar 1 disorder   . Insomnia   . ADHD (attention deficit hyperactivity disorder)   . Depression   . Excessive weight loss    Insulin Requirements in the past 24 hours:  Sensitive SSI ordered - CBGs 137-154 - 5 units of SSI given in past 24 hours  Nutritional Goals:  RD recs:  Kcal: 2250-2550  Protein: 110-130g  Fluid: 2.2-2.5L/day  Clinimix E 5/20 at a goal rate of 100  ml/hr + 20% fat emulsion at 10 ml/hr to provide: 120 g/day protein, 2592 Kcal/day.  Current Nutrition: poor intake prior to admission, NPO currently   IVF: 0.45% NaCl + KCl at [facility]  Assessment: 37 YOM with h/o ulcerative colitis, he underwent colonoscopy 11/21 which revealed severe UC. Appears he continues to experience abd pain, having 5-8 BM/day, and unable to eat.  He has been admitted to start TPN secondary to severe malnutrition evidenced by severe muscle wasting and subcutaneous fat loss throughout body with 27% weight loss in the past 7-8 months per pt report (50 lb unintentional weight loss).  Plan per GI is to continue TNA and bowel rest, Continue solumedrol 40 mg BID; Remicade given on 11/26.  Awaiting Biopsy results from colonoscopy on 11/21  TPN day #4   Glucose -  CBGs trending up just slightly, okay on SSI.  Tolerating steroids and TPN okay for now, will continue to monitor.  Yesterday's random elevated CBG was secondary to a soda intake early in the morning.  Patient instructed to remain NPO.  Electrolytes - WNL - Potassium, Phos, and mg all WNL, no evidence of refeeding syndrome at this time, will continue to titrate to goal slowly  LFTs - WNL  Renal - Scr is WNL, CrCrl > 100 ml/min   TGs - 49 (11/25)  Prealbumin - 21.3 (11/24)  TPN Access: PICC placed on 11/24  Plan:   Increase Clinimix E 5/20 to 70 ml/hr.   Continue 20% Fat emulsion at 77ml/hr (This will maintain fat kCals < 30% of total Kcals once at new goal).    Plan to advance as tolerated to the goal rate, need to watch closely for refeeding syndrome d/t weight loss and poor PO intake  TNA will NOT contain MVI/TE as patient on oral multivitamin  Continue M[facility]F at [facility]  Continue SSI (sensitive) and CBGs q6h - May need to adjust to moderate SSI if CBGs continue to slowly rise  TNA lab panels on Mondays & Thursdays.  BorgerdingLoma Messing PharmD Pager #: [phone] 8:17 AM 07/12/2013

## 2013-07-12 NOTE — Progress Notes (Signed)
[facility] Progress Note  Subjective:  Main complaint is still fatigue.  Only one BM, which was Cdiff negative.  Still has carmpy abdominal pain but says that they vicodin helps.  Objective:  Vital signs in last 24 hours: Temp:  [97.7 F (36.5 C)-98.2 F (36.8 C)] 97.7 F (36.5 C) (11/27 0629) Pulse Rate:  [68-82] 72 (11/27 0629) Resp:  [16] 16 (11/27 0629) BP: (90-98)/(48-61) 95/48 mmHg (11/27 0629) SpO2:  [94 %-98 %] 94 % (11/27 0629) Last BM Date: 07/12/13 General:  Alert, thin, in NAD Heart:  Regular rate and rhythm; no murmurs Pulm:  CTAB.  No W/R/R. Abdomen:  Soft, non-distended.  BS present.  Non-tender. Extremities:  Without edema. Neurologic:  Alert and  oriented x4;  grossly normal neurologically. Psych:  Alert and cooperative.  Very obsessive and preoccupied by his condition and meds he is receiving.  Intake/Output from previous day: 11/26 0701 - 11/27 0700 In: 2266 [ZOX:0960] Out: 2150 [Urine:2150]  Lab Results:  Recent Labs  07/09/13 1559  WBC 4.2  HGB 12.1*  HCT 35.3*  PLT 445*   BMET  Recent Labs  07/10/13 0500 07/11/13 0551 07/12/13 0420  NA 132* 134* 135  K 4.0 4.3 4.2  CL 96 99 100  CO2 28 28 29   GLUCOSE 136* 155* 160*  BUN 13 12 11   CREATININE 0.59 0.61 0.57  CALCIUM 8.7 9.0 8.9   LFT  Recent Labs  07/12/13 0420  PROT 6.7  ALBUMIN 3.1*  AST 7  ALT 8  ALKPHOS 54  BILITOT 0.3   PT/INR  Recent Labs  07/09/13 1559  LABPROT 13.3  INR 1.03   Hepatitis Panel  Recent Labs  07/09/13 1559  HEPBSAG NEGATIVE   Assessment / Plan: 1. Severe universal ulcerative colitis.  Improving.  Continue Solumedrol, Imuran and Azulfidine, first dose of Remicade yesterday, 11/26. 2. Malnutrition. Prealbumin actually normal. On TNA, bowel rest.  3. ADD / history of psychosis. On Geodon, Lamictal and Adderall.     LOS: 3 days   ZEHR, JESSICA D.  07/12/2013, 10:58 AM  Pager number [phone]     Attending physician's note    I have taken an interval history, reviewed the chart and examined the patient. I agree with the Advanced Practitioner's note, impression and recommendations. Continue current mgmt. Change Levbid to bid (not prn) and continue Vicodin as needed for crampy abdominal pain. Consider a trial of clear liquids Fri or Sat. Repeat CBC tomorrow.   Venita Lick. Russella Dar, MD [facility]

## 2013-07-13 LAB — GLUCOSE, CAPILLARY
Glucose-Capillary: 130 mg/dL — ABNORMAL HIGH (ref 70–99)
Glucose-Capillary: 130 mg/dL — ABNORMAL HIGH (ref 70–99)
Glucose-Capillary: 102 mg/dL — ABNORMAL HIGH (ref 70–99)
Glucose-Capillary: 104 mg/dL — ABNORMAL HIGH (ref 70–99)

## 2013-07-13 LAB — COMPREHENSIVE METABOLIC PANEL
ALT: 7 U/L (ref 0–53)
Albumin: 3.3 g/dL — ABNORMAL LOW (ref 3.5–5.2)
BUN: 13 mg/dL (ref 6–23)
Calcium: 8.9 mg/dL (ref 8.4–10.5)
GFR calc Af Amer: >90 mL/min (ref >90)
Glucose, Bld: 153 mg/dL — ABNORMAL HIGH (ref 70–99)
Potassium: 4 mEq/L (ref 3.5–5.1)
Sodium: 134 mEq/L — ABNORMAL LOW (ref 135–145)
Total Bilirubin: 0.4 mg/dL (ref 0.3–1.2)
Total Protein: 7.1 g/dL (ref 6.0–8.3)

## 2013-07-13 LAB — CBC
HCT: 32.7 % — ABNORMAL LOW (ref 39.0–52.0)
Hemoglobin: 11.4 g/dL — ABNORMAL LOW (ref 13.0–17.0)
MCV: 100 fL (ref 78.0–100.0)
WBC: 5.8 10*3/uL (ref 4.0–10.5)

## 2013-07-13 MED ORDER — FAT EMULSION 20 % IV EMUL
250.0000 mL | INTRAVENOUS | Status: AC
  Administered 2013-07-13: 250 mL via INTRAVENOUS
  Filled 2013-07-13: qty 250

## 2013-07-13 MED ORDER — METHYLPREDNISOLONE SODIUM SUCC 40 MG IJ SOLR
30.0000 mg | Freq: Two times a day (BID) | INTRAMUSCULAR | Status: DC
  Administered 2013-07-13 – 2013-07-15 (×6): 30 mg via INTRAVENOUS
  Filled 2013-07-13: qty 0.75
  Filled 2013-07-13 (×2): qty 1
  Filled 2013-07-13 (×5): qty 0.75

## 2013-07-13 MED ORDER — CLINIMIX E/DEXTROSE (5/20) 5 % IV SOLN
INTRAVENOUS | Status: AC
  Administered 2013-07-13: 19:00:00 via INTRAVENOUS
  Filled 2013-07-13: qty 2000

## 2013-07-13 NOTE — Progress Notes (Signed)
    Progress Note   Subjective  No diarrhea. Stools forming. Abdominal pain controlled with meds. He feels more anxious than usual.   Objective   Vital signs in last 24 hours: Temp:  [97.9 F (36.6 C)-98.5 F (36.9 C)] 97.9 F (36.6 C) (11/28 0514) Pulse Rate:  [82-116] 82 (11/28 0514) Resp:  [16-20] 16 (11/28 0514) BP: (92-119)/(57-83) 107/69 mmHg (11/28 0514) SpO2:  [94 %-98 %] 98 % (11/28 0514) Last BM Date: 07/12/13 General:  Thin white male in NAD Heart:  Regular rate and rhythm Lungs: Respirations even and unlabored, lungs CTA bilaterally Abdomen:  Soft, nontender and nondistended. Normal bowel sounds. Extremities:  Without edema. Neurologic:  Alert and oriented,  grossly normal neurologically. Psych:  Cooperative. Very anxious but pleasant   Lab Results:  Recent Labs  07/13/13 0510  WBC 5.8  HGB 11.4*  HCT 32.7*  PLT 312   BMET  Recent Labs  07/11/13 0551 07/12/13 0420 07/13/13 0510  NA 134* 135 134*  K 4.3 4.2 4.0  CL 99 100 98  CO2 28 29 28   GLUCOSE 155* 160* 153*  BUN 12 11 13   CREATININE 0.61 0.57 0.54  CALCIUM 9.0 8.9 8.9   LFT  Recent Labs  07/13/13 0510  PROT 7.1  ALBUMIN 3.3*  AST 9  ALT 7  ALKPHOS 56  BILITOT 0.4     Assessment / Plan:   1. Severe universal ulcerative colitis. Improving. He received initial dose of Remicade on Wednesday. Continue Azulfidine and Imuran. Will decrease Solumedrol dose since patient improving and steroids adding to his anxiousness. Trial of clears today. Minimal hgb drop over last few days suspect this is hydration related.  2. Malnutrition. Prealbumin actually normal. On TNA, trial of clears.   3. ADD / anxiety / depression /history of psychosis. On Geodon, Lamictal and Adderall. Feels more anxious than normal, inquiring about adding Klonopin to Xanax. I explained that steroids could be causing some of this and that I would reduce dose.   LOS: 4 days   [name]  07/13/2013, 8:51 AM     Attending physician's note   I have taken an interval history, reviewed the chart and examined the patient. I agree with the Advanced Practitioner's note, impression and recommendations. Trial of clear liquids. Decrease Solumedrol to 30 mg q12h.   Venita Lick. Russella Dar, MD [facility]

## 2013-07-13 NOTE — Progress Notes (Signed)
PARENTERAL NUTRITION CONSULT NOTE - INITIAL  Pharmacy Consult for TPN Indication: severe ulcerative colitis  Allergies  Allergen Reactions  . Cephalexin     REACTION: urticaria (hives)   Patient Measurements: Height: 6\' 2"  (188 cm) Weight: 131 lb 6.3 oz (59.6 kg) IBW/kg (Calculated) : 82.2 Appears he has lost 37# in last 7months  Vital Signs: Temp: 97.9 F (36.6 C) (11/28 0514) Temp src: Oral (11/28 0514) BP: 107/69 mmHg (11/28 0514) Pulse Rate: 82 (11/28 0514) Intake/Output from previous day: 11/27 0701 - 11/28 0700 In: 0  Out: 1350 [Urine:1350] Intake/Output from this shift: Total I/O In: -  Out: 500 [Urine:500]  Labs:  Recent Labs  07/13/13 0510  WBC 5.8  HGB 11.4*  HCT 32.7*  PLT 312     Recent Labs  07/11/13 0551 07/12/13 0420 07/13/13 0510  NA 134* 135 134*  K 4.3 4.2 4.0  CL 99 100 98  CO2 28 29 28   GLUCOSE 155* 160* 153*  BUN 12 11 13   CREATININE 0.61 0.57 0.54  CALCIUM 9.0 8.9 8.9  MG 2.1 2.4  --   PHOS 3.5 3.7  --   PROT  --  6.7 7.1  ALBUMIN  --  3.1* 3.3*  AST  --  7 9  ALT  --  8 7  ALKPHOS  --  54 56  BILITOT  --  0.3 0.4   Estimated Creatinine Clearance: 109.7 ml/min (by C-G formula based on Cr of 0.54).    Recent Labs  07/12/13 1740 07/13/13 0010 07/13/13 0559  GLUCAP 114* 130*  130* 125*   Insulin Requirements in the past 24 hours:  Sensitive SSI ordered - CBGs 114,130/125 with 2 units of SSI given in past 24 hours  Nutritional Goals:  RD recs:  Kcal: 2250-2550  Protein: 110-130g  Fluid: 2.2-2.5L/day  Clinimix E 5/20 at a goal rate of 100 ml/hr + 20% fat emulsion at 10 ml/hr to provide: 120 g/day protein, 2592 Kcal/day.  Current Nutrition: poor intake prior to admission, NPO currently   IVF: none  Assessment: 60 YOM with h/o ulcerative colitis, he underwent colonoscopy 11/21 which revealed severe UC. Appears he continues to experience abd pain, having 5-8 BM/day, and unable to eat.  He has been admitted to  start TPN secondary to severe malnutrition evidenced by severe muscle wasting and subcutaneous fat loss throughout body with 27% weight loss in the past 7-8 months per pt report (50 lb unintentional weight loss).  Plan per GI is to continue TNA and bowel rest, continue taper of solumedrol to 30 mg BID; Remicade given on 11/26.  Awaiting Biopsy results from colonoscopy on 11/21. Received Remicade 11/26.  TPN day #5  Glucose -  CBGs okay on SSI. Electrolytes - WNL - Na 134,no evidence of refeeding syndrome at this time. Plan trial of clear liquids today, will keep TNA rate at 85ml/hr today  LFTs - WNL  Renal - Scr is WNL, CrCrl > 100 ml/min   TGs - 49 (11/25)  Prealbumin - 21.3 (11/24)- elevated artificially because of steroid, continuing to taper SoluMedrol now 30mg  bid  TPN Access: PICC placed on 11/24  Plan:   Continue Clinimix E 5/20 at 70 ml/hr.   Continue 20% Fat emulsion at 16ml/hr (This will maintain fat kCals < 30% of total Kcals once at new goal).    Will  advance as tolerated to the goal rate if patient does not tolerate trial of clear liquids  TNA will NOT contain MVI/TE  as patient on oral multivitamin  Continue SSI (sensitive) and CBGs q6h  TNA lab panels on Mondays & Thursdays.  Otho Bellows PharmD Pager [phone] 07/13/2013, 10:04 AM

## 2013-07-14 LAB — GLUCOSE, CAPILLARY
Glucose-Capillary: 129 mg/dL — ABNORMAL HIGH (ref 70–99)
Glucose-Capillary: 108 mg/dL — ABNORMAL HIGH (ref 70–99)
Glucose-Capillary: 122 mg/dL — ABNORMAL HIGH (ref 70–99)

## 2013-07-14 MED ORDER — CLINIMIX E/DEXTROSE (5/20) 5 % IV SOLN
INTRAVENOUS | Status: AC
  Administered 2013-07-14: 18:00:00 via INTRAVENOUS
  Filled 2013-07-14: qty 2000

## 2013-07-14 MED ORDER — FAT EMULSION 20 % IV EMUL
250.0000 mL | INTRAVENOUS | Status: AC
  Administered 2013-07-14: 250 mL via INTRAVENOUS
  Filled 2013-07-14: qty 250

## 2013-07-14 NOTE — Progress Notes (Addendum)
PARENTERAL NUTRITION CONSULT NOTE - INITIAL  Pharmacy Consult for TPN Indication: severe ulcerative colitis  Allergies  Allergen Reactions  . Cephalexin     REACTION: urticaria (hives)   Patient Measurements: Height: 6\' 2"  (188 cm) Weight: 132 lb 3.2 oz (59.966 kg) IBW/kg (Calculated) : 82.2 Appears he has lost 37# in last 7months  Vital Signs: Temp: 97.8 F (36.6 C) (11/29 0555) Temp src: Oral (11/29 0555) BP: 95/55 mmHg (11/29 0555) Pulse Rate: 75 (11/29 0555) Intake/Output from previous day: 11/28 0701 - 11/29 0700 In: 240 [P.O.:240] Out: 2600 [Urine:2600] Intake/Output from this shift:    Labs:  Recent Labs  07/13/13 0510  WBC 5.8  HGB 11.4*  HCT 32.7*  PLT 312     Recent Labs  07/12/13 0420 07/13/13 0510  NA 135 134*  K 4.2 4.0  CL 100 98  CO2 29 28  GLUCOSE 160* 153*  BUN 11 13  CREATININE 0.57 0.54  CALCIUM 8.9 8.9  MG 2.4  --   PHOS 3.7  --   PROT 6.7 7.1  ALBUMIN 3.1* 3.3*  AST 7 9  ALT 8 7  ALKPHOS 54 56  BILITOT 0.3 0.4   Estimated Creatinine Clearance: 110.4 ml/min (by C-G formula based on Cr of 0.54).    Recent Labs  07/13/13 1805 07/13/13 2342 07/14/13 0550  GLUCAP 104* 145* 129*   Insulin Requirements in the past 24 hours:  Sensitive SSI ordered - 2 units SSI for CBGs 102-145 in past 24 hours  Nutritional Goals:  RD recs:  Kcal: 2250-2550  Protein: 110-130g  Fluid: 2.2-2.5L/day  Clinimix E 5/20 at a goal rate of 100 ml/hr + 20% fat emulsion at 10 ml/hr to provide: 120 g/day protein, 2592 Kcal/day.  Current Nutrition: poor intake prior to admission, CLD trial started on 11/28  IVF: none  Assessment: 32 YOM with h/o ulcerative colitis, he underwent colonoscopy 11/21 which revealed severe UC. Appears he continues to experience abd pain, having 5-8 BM/day, and unable to eat.  He has been admitted to start TPN secondary to severe malnutrition evidenced by severe muscle wasting and subcutaneous fat loss throughout body  with 27% weight loss in the past 7-8 months per pt report (50 lb unintentional weight loss).  Plan per GI is to continue TNA and bowel rest, continue taper of solumedrol to 30 mg BID; Remicade given on 11/26.  Awaiting Biopsy results from colonoscopy on 11/21. Received Remicade 11/26.  TPN day #6  Glucose -  CBGs okay on SSI.  Solumedrol being tapered down  Electrolytes -  WNL on 11/28 with no evidence of refeeding syndrome at this time. CLD started on 11/28, appears patient tolerated ~ 60% PO intake based on RN charting, Will f/u with GI recommendations for TNA rate changes and wean  LFTs - WNL  Renal - Scr is WNL, CrCrl > 100 ml/min   TGs - 49 (11/25)  Prealbumin - 21.3 (11/24)- May be elevated artificially because of steroid, continuing to taper SoluMedrol now 30mg  bid  TPN Access: PICC placed on 11/24  Plan:   Continue Clinimix E 5/20 at 70 ml/hr.   Continue 20% Fat emulsion at 37ml/hr (This will maintain fat kCals < 30% of total Kcals once at new goal).  Patient is on CLD, consider advancing diet tomorrow, will continue at rate of 70 ml/hr since patient also taking in CLD.    TNA will NOT contain MVI/TE as patient on oral multivitamin  Continue SSI (sensitive) and CBGs q6h  TNA lab panels on Mondays & Thursdays.  Khaleef Ruby, Loma Messing PharmD Pager #: [phone] 7:40 AM 07/14/2013

## 2013-07-14 NOTE — Progress Notes (Addendum)
[facility] Progress Note  Subjective:  Had 8 episodes of small volume diarrhea and ugency yesterday only 1 episode so far today. Remains on clears.  Objective:  Vital signs in last 24 hours: Temp:  [97.8 F (36.6 C)-98.6 F (37 C)] 97.8 F (36.6 C) (11/29 0555) Pulse Rate:  [75-108] 75 (11/29 0555) Resp:  [14-18] 14 (11/29 0555) BP: (95-110)/(54-74) 95/55 mmHg (11/29 0555) SpO2:  [95 %-96 %] 95 % (11/29 0555) Weight:  [132 lb 3.2 oz (59.966 kg)] 132 lb 3.2 oz (59.966 kg) (11/28 1312) Last BM Date: 07/13/13  General:  Alert, Well-developed, in NAD Heart:  Regular rate and rhythm; no murmurs Pulm: Abdomen:  Soft, nontender and nondistended. Normal bowel sounds, without guarding, and without rebound.   Extremities:  Without edema. Neurologic:  Alert and  oriented x4;  grossly normal neurologically. Psych:  Alert and cooperative. Anxious.  Intake/Output from previous day: 11/28 0701 - 11/29 0700 In: 240 [P.O.:240] Out: 2600 [Urine:2600]  Lab Results:  Recent Labs  07/13/13 0510  WBC 5.8  HGB 11.4*  HCT 32.7*  PLT 312   BMET  Recent Labs  07/12/13 0420 07/13/13 0510  NA 135 134*  K 4.2 4.0  CL 100 98  CO2 29 28  GLUCOSE 160* 153*  BUN 11 13  CREATININE 0.57 0.54  CALCIUM 8.9 8.9   LFT  Recent Labs  07/13/13 0510  PROT 7.1  ALBUMIN 3.3*  AST 9  ALT 7  ALKPHOS 56  BILITOT 0.4   Assessment / Plan: 1. Severe universal ulcerative colitis. Improving. He received initial dose of Remicade on Wednesday. Continue Azulfidine, Imuran and Solumedrol. Continue clears today to be sure he tolerates it adequately. Consider advancing diet tomorrow.   2. Malnutrition. On TNA, clears.   3. 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 On Geodon, Lamictal, Adderall and Xanax. Feels more anxious than normal, inquiring about adding Klonopin. I explained that steroids could be causing some of this and that we reduced the dose yesterday. Will need psych  consult if these medications need to be modified or adjusted.    LOS: 5 days   ZEHR, JESSICA D.  07/14/2013, 10:36 AM  Pager number [phone]    Attending physician's note   I have taken an interval history, reviewed the chart and examined the patient. I agree with the Advanced Practitioner's note, impression and recommendations.   Venita Lick. Russella Dar, MD [facility]

## 2013-07-15 LAB — GLUCOSE, CAPILLARY
Glucose-Capillary: 111 mg/dL — ABNORMAL HIGH (ref 70–99)
Glucose-Capillary: 133 mg/dL — ABNORMAL HIGH (ref 70–99)

## 2013-07-15 MED ORDER — FAT EMULSION 20 % IV EMUL
250.0000 mL | INTRAVENOUS | Status: AC
  Administered 2013-07-15: 250 mL via INTRAVENOUS
  Filled 2013-07-15: qty 250

## 2013-07-15 MED ORDER — CLINIMIX E/DEXTROSE (5/20) 5 % IV SOLN
INTRAVENOUS | Status: AC
  Administered 2013-07-15: 18:00:00 via INTRAVENOUS
  Filled 2013-07-15: qty 2000

## 2013-07-15 NOTE — Progress Notes (Signed)
    Progress Note   Subjective  Stable. Tolerating clear liquids diet fairly well with 3-4 loose stools with urgency.    Objective  Vital signs in last 24 hours: Temp:  [97.9 F (36.6 C)-98.6 F (37 C)] 97.9 F (36.6 C) (11/30 0537) Pulse Rate:  [87-120] 87 (11/30 0537) Resp:  [16] 16 (11/30 0537) BP: (95-112)/(67-78) 112/78 mmHg (11/30 0537) SpO2:  [97 %-98 %] 97 % (11/30 0537) Last BM Date: 07/14/13  General:   Alert,  Well-developed, thin, white male in NAD Heart:  Regular rate and rhythm; no murmurs Abdomen:  Soft, nontender and nondistended. Normal bowel sounds, without guarding, and without rebound.   Extremities:  Without edema. Neurologic:  Alert and  oriented x4;  grossly normal neurologically. Psych:  Alert and cooperative. Anxious.  Intake/Output from previous day: 11/29 0701 - 11/30 0700 In: 600 [P.O.:600] Out: 3900 [Urine:3900] Intake/Output this shift:    Lab Results:  Recent Labs  07/13/13 0510  WBC 5.8  HGB 11.4*  HCT 32.7*  PLT 312   BMET  Recent Labs  07/13/13 0510  NA 134*  K 4.0  CL 98  CO2 28  GLUCOSE 153*  BUN 13  CREATININE 0.54  CALCIUM 8.9   LFT  Recent Labs  07/13/13 0510  PROT 7.1  ALBUMIN 3.3*  AST 9  ALT 7  ALKPHOS 56  BILITOT 0.4      Assessment & Plan   1. Severe universal ulcerative colitis. Improving. He received initial dose of Remicade on Wednesday. Continue Azulfidine, Imuran and Solumedrol. Continue clears today. Consider advancing to full liquid, no caffeine, lactose free diet tomorrow.   2. Malnutrition. On TNA, clears.   3. 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 On Geodon, Lamictal, Adderall and Xanax. Feels more anxious than normal, inquiring about adding Klonopin. I explained that steroids could be causing some of this and that we reduced the dose 2 days ago. Will need psych consult if psych medications need to be modified or adjusted.    LOS: 6 days   Judie Petit T. Russella Dar MD [facility]  07/15/2013, 9:09 AM

## 2013-07-15 NOTE — Progress Notes (Signed)
PARENTERAL NUTRITION CONSULT NOTE - INITIAL  Pharmacy Consult for TPN Indication: severe ulcerative colitis  Allergies  Allergen Reactions  . Cephalexin     REACTION: urticaria (hives)   Patient Measurements: Height: 6\' 2"  (188 cm) Weight: 132 lb 3.2 oz (59.966 kg) IBW/kg (Calculated) : 82.2 Appears he has lost 37# in last 7months  Vital Signs: Temp: 97.9 F (36.6 C) (11/30 0537) Temp src: Oral (11/30 0537) BP: 112/78 mmHg (11/30 0537) Pulse Rate: 87 (11/30 0537) Intake/Output from previous day: 11/29 0701 - 11/30 0700 In: 600 [P.O.:600] Out: 3900 [Urine:3900] Intake/Output from this shift:    Labs:  Recent Labs  07/13/13 0510  WBC 5.8  HGB 11.4*  HCT 32.7*  PLT 312     Recent Labs  07/13/13 0510  NA 134*  K 4.0  CL 98  CO2 28  GLUCOSE 153*  BUN 13  CREATININE 0.54  CALCIUM 8.9  PROT 7.1  ALBUMIN 3.3*  AST 9  ALT 7  ALKPHOS 56  BILITOT 0.4   Estimated Creatinine Clearance: 110.4 ml/min (by C-G formula based on Cr of 0.54).    Recent Labs  07/14/13 1826 07/14/13 2325 07/15/13 0636  GLUCAP 122* 124* 132*   Insulin Requirements in the past 24 hours:  Sensitive SSI ordered - 2 units SSI for CBGs 108-132 in past 24 hours  Nutritional Goals:  RD recs:  Kcal: 2250-2550  Protein: 110-130g  Fluid: 2.2-2.5L/day  Clinimix E 5/20 at a goal rate of 100 ml/hr + 20% fat emulsion at 10 ml/hr to provide: 120 g/day protein, 2592 Kcal/day.  Current Nutrition: poor intake prior to admission, CLD trial started on 11/28  IVF: none  Assessment: 24 YOM with h/o ulcerative colitis, he underwent colonoscopy 11/21 which revealed severe UC. Appears he continues to experience abd pain, having 5-8 BM/day, and unable to eat.  He has been admitted to start TPN secondary to severe malnutrition evidenced by severe muscle wasting and subcutaneous fat loss throughout body with 27% weight loss in the past 7-8 months per pt report (50 lb unintentional weight loss).   Plan per GI is to continue TNA and bowel rest, continue taper of solumedrol to 30 mg BID; Remicade given on 11/26.  Awaiting Biopsy results from colonoscopy on 11/21. Received Remicade 11/26.   11/30: CLD started on 11/28, appears patient tolerated ~ 100% PO CLD intake based on RN charting.  Patient continuing to have 3-4 loose stools.   Plan is to continue CLD today and consider advancing to full liquid, lactose free diet on 12/1, no caffeine.   TNA is currently running slightly below goal rate at 70 m/hr since tolerating CLD.  If okay with GI and FLD started on 12/1 will discuss with GI weaning TNA down further  TPN day #7  Glucose -  CBGs at goal < 150.  Solumedrol being tapered down, currently 30 mg IV q12h  Electrolytes -  WNL on 11/28 with no evidence of refeeding syndrome at this time. LFTs - WNL  Renal - Scr is WNL, CrCrl > 100 ml/min   TGs - 49 (11/25)  Prealbumin - 21.3 (11/24)- May be elevated artificially because of steroid, continuing to taper SoluMedrol now 30mg  bid  TPN Access: PICC placed on 11/24  Plan:   Continue Clinimix E 5/20 at 70 ml/hr.   Continue 20% Fat emulsion at 41ml/hr (This will maintain fat kCals < 30% of total Kcals once at new goal).  Patient is on CLD, consider advancing diet tomorrow,  will continue at rate of 70 ml/hr since patient also taking in CLD.    TNA will NOT contain MVI/TE as patient on oral multivitamin  Continue SSI (sensitive) and CBGs q6h  TNA lab panels on Mondays & Thursdays.  BorgerdingLoma Messing PharmD Pager #: [phone] 10:29 AM 07/15/2013

## 2013-07-16 LAB — DIFFERENTIAL
Basophils Relative: 0 % (ref 0–1)
Monocytes Absolute: 0.5 10*3/uL (ref 0.1–1.0)
Monocytes Relative: 10 % (ref 3–12)
Neutro Abs: 3.5 10*3/uL (ref 1.7–7.7)

## 2013-07-16 LAB — COMPREHENSIVE METABOLIC PANEL
ALT: 7 U/L (ref 0–53)
AST: 7 U/L (ref 0–37)
Albumin: 3.2 g/dL — ABNORMAL LOW (ref 3.5–5.2)
Alkaline Phosphatase: 49 U/L (ref 39–117)
Chloride: 103 mEq/L (ref 96–112)
Creatinine, Ser: 0.54 mg/dL (ref 0.50–1.35)
GFR calc Af Amer: >90 mL/min (ref >90)
GFR calc non Af Amer: >90 mL/min (ref >90)
Potassium: 4 mEq/L (ref 3.5–5.1)
Sodium: 138 mEq/L (ref 135–145)
Total Bilirubin: 0.6 mg/dL (ref 0.3–1.2)

## 2013-07-16 LAB — CBC
HCT: 29.3 % — ABNORMAL LOW (ref 39.0–52.0)
Hemoglobin: 9.7 g/dL — ABNORMAL LOW (ref 13.0–17.0)
MCHC: 33.1 g/dL (ref 30.0–36.0)
RDW: 13.9 % (ref 11.5–15.5)

## 2013-07-16 LAB — PREALBUMIN: Prealbumin: 38 mg/dL — ABNORMAL HIGH (ref 17.0–34.0)

## 2013-07-16 LAB — TRIGLYCERIDES: Triglycerides: 48 mg/dL (ref <150)

## 2013-07-16 LAB — GLUCOSE, CAPILLARY
Glucose-Capillary: 105 mg/dL — ABNORMAL HIGH (ref 70–99)
Glucose-Capillary: 104 mg/dL — ABNORMAL HIGH (ref 70–99)
Glucose-Capillary: 112 mg/dL — ABNORMAL HIGH (ref 70–99)

## 2013-07-16 LAB — PHOSPHORUS: Phosphorus: 3.5 mg/dL (ref 2.3–4.6)

## 2013-07-16 MED ORDER — INSULIN ASPART 100 UNIT/ML ~~LOC~~ SOLN: 0.0000 [IU] | Freq: Three times a day (TID) | SUBCUTANEOUS | Status: DC

## 2013-07-16 MED ORDER — PRO-STAT SUGAR FREE PO LIQD
30.0000 mL | Freq: Two times a day (BID) | ORAL | Status: DC
  Administered 2013-07-16 – 2013-07-18 (×4): 30 mL via ORAL
  Filled 2013-07-16 (×6): qty 30

## 2013-07-16 MED ORDER — FOLIC ACID 1 MG PO TABS
1.0000 mg | ORAL_TABLET | Freq: Every day | ORAL | Status: DC
  Administered 2013-07-16 – 2013-07-22 (×7): 1 mg via ORAL
  Filled 2013-07-16 (×7): qty 1

## 2013-07-16 MED ORDER — CLINIMIX E/DEXTROSE (5/20) 5 % IV SOLN
INTRAVENOUS | Status: AC
  Administered 2013-07-16: 17:00:00 via INTRAVENOUS
  Filled 2013-07-16: qty 2000

## 2013-07-16 MED ORDER — BENEPROTEIN PO POWD
1.0000 | Freq: Three times a day (TID) | ORAL | Status: DC
  Filled 2013-07-16: qty 227

## 2013-07-16 MED ORDER — METHYLPREDNISOLONE SODIUM SUCC 40 MG IJ SOLR
20.0000 mg | Freq: Two times a day (BID) | INTRAMUSCULAR | Status: DC
  Administered 2013-07-16 – 2013-07-19 (×7): 20 mg via INTRAVENOUS
  Filled 2013-07-16 (×3): qty 0.5
  Filled 2013-07-16: qty 1
  Filled 2013-07-16 (×3): qty 0.5

## 2013-07-16 MED ORDER — BOOST / RESOURCE BREEZE PO LIQD
1.0000 | Freq: Two times a day (BID) | ORAL | Status: DC
  Administered 2013-07-17 (×2): 1 via ORAL

## 2013-07-16 MED ORDER — RESOURCE INSTANT PROTEIN PO PWD PACKET
6.0000 g | Freq: Three times a day (TID) | ORAL | Status: DC
  Administered 2013-07-16 – 2013-07-18 (×4): 6 g via ORAL
  Filled 2013-07-16 (×8): qty 6

## 2013-07-16 MED ORDER — FAT EMULSION 20 % IV EMUL
250.0000 mL | INTRAVENOUS | Status: AC
  Administered 2013-07-16: 250 mL via INTRAVENOUS
  Filled 2013-07-16: qty 250

## 2013-07-16 NOTE — Progress Notes (Signed)
PARENTERAL NUTRITION CONSULT NOTE - Follow Up  Pharmacy Consult for TPN Indication: severe ulcerative colitis  Allergies  Allergen Reactions  . Cephalexin     REACTION: urticaria (hives)   Patient Measurements: Height: 6\' 2"  (188 cm) Weight: 132 lb 3.2 oz (59.966 kg) IBW/kg (Calculated) : 82.2 Appears he has lost 37# in last 7months  Vital Signs: Temp: 98.7 F (37.1 C) (12/01 0624) Temp src: Oral (12/01 0624) BP: 99/61 mmHg (12/01 0624) Pulse Rate: 94 (12/01 0624) Intake/Output from previous day: 11/30 0701 - 12/01 0700 In: 1760 [P.O.:1760] Out: 2600 [Urine:2600] Intake/Output from this shift:    Labs:  Recent Labs  07/16/13 0610  WBC 4.7  HGB 9.7*  HCT 29.3*  PLT 249     Recent Labs  07/16/13 0610  NA 138  K 4.0  CL 103  CO2 28  GLUCOSE 128*  BUN 11  CREATININE 0.54  CALCIUM 8.6  MG 2.1  PHOS 3.5  PROT 6.4  ALBUMIN 3.2*  AST 7  ALT 7  ALKPHOS 49  BILITOT 0.6  TRIG 48   Estimated Creatinine Clearance: 110.4 ml/min (by C-G formula based on Cr of 0.54).    Recent Labs  07/15/13 1757 07/15/13 2342 07/16/13 0642  GLUCAP 133* 111* 121*   Insulin Requirements in the past 24 hours:  Sensitive SSI ordered - 2 units SSI for CBGs in past 24 hours  Nutritional Goals:  RD recs:  Kcal: 2250-2550  Protein: 110-130g  Fluid: 2.2-2.5L/day  Clinimix E 5/20 at a goal rate of 100 ml/hr + 20% fat emulsion at 10 ml/hr to provide: 120 g/day protein, 2592 Kcal/day.  Current Nutrition: poor intake prior to admission, CLD trial started on 11/28  IVF: none  Assessment: 70 YOM with h/o ulcerative colitis, he underwent colonoscopy 11/21 which revealed severe UC. Appears he continues to experience abd pain, having 5-8 BM/day, and unable to eat.  He has been admitted to start TPN secondary to severe malnutrition evidenced by severe muscle wasting and subcutaneous fat loss throughout body with 27% weight loss in the past 7-8 months per pt report (50 lb  unintentional weight loss).  Plan per GI is to continue TNA and bowel rest, continue taper of solumedrol to 30 mg BID; Remicade given on 11/26.  Awaiting Biopsy results from colonoscopy on 11/21. Received Remicade 11/26.  12/1: To try full liquid diet starting today per GI note. No more loose stools last 24hrs. TNA is currently running slightly below goal rate at 70 m/hr since tolerating CLD.    TPN day #8  Glucose -  CBGs at goal < 150.  Solumedrol being tapered down, currently 20 mg IV q12h  Electrolytes -  WNL with no evidence of refeeding syndrome at this time. LFTs - WNL  Renal - Scr is WNL, CrCrl > 100 ml/min   TGs - 48 (11/1)  Prealbumin - 21.3 (11/24)- May be elevated artificially because of steroid, continuing to taper SoluMedrol now 20mg  bid  TPN Access: PICC placed on 11/24  Plan:   Continue Clinimix E 5/20 at 70 ml/hr for now with potential to wean tomorrow if tolerates full liquid diet starting today  Continue 20% Fat emulsion at 39ml/hr  Patient has been tolerating clear liquid diet and will try full liquid diet starting today so will continue TNA at current rate for now and if tolerates full liquid diet will plan d/c of TNA tomorrow likely  TNA will NOT contain MVI/TE as patient on oral multivitamin  Continue  SSI (sensitive) and CBGs but change ACHS  TNA lab panels on Mondays & Thursdays.   Hessie Knows, PharmD, BCPS Pager [phone] 07/16/2013 9:25 AM

## 2013-07-16 NOTE — Progress Notes (Signed)
NUTRITION FOLLOW UP  Intervention:   - TPN per pharmacy - D/C Ensure Complete - Resource Breeze BID, Beneprotein TID, Prostat BID - Diet advancement per MD - Will continue to monitor   Nutrition Dx:   Inadequate oral intake related to inability to eat as evidenced by NPO - no longer appropriate, diet advanced  New nutrition dx: Inadequate oral intake related to stomach pain, poor appetite as evidenced by pt report.   Goal:   TPN to meet >90% of estimated nutritional needs - not met r/t potential to wean  New goal: Pt to consume >50% of meals/supplements   Monitor:   Weights, labs, intake, stomach pain, loose stools, TPN  Assessment:   Pt discussed during multidisciplinary rounds. Pt with severe ulcerative colitis of the entire colon. He was diagnosed at least 13 years ago. His last colonoscopy on 11/21 showed severe erythema and several areas of deep ulcerations throughout the colon. Currently, on the prednisone, he is having 5-8 bowel movements a day without eating much at all. He sees intermittent blood in his stools.   11/25 - Met with pt who reports 50 pound unintended weight loss in the past 7-8 months, 15 pounds in the past 34 days. Pt reports for the past 3 months he has been living of off 2-3 containers of 6 ounces of Greek yogurt (around 120 calories, 12g protein). Reports PTA having 10-15 episodes of diarrhea today, denies any today. Pt's biggest complaint is constant fatigue. Plan is for bowel rest with TPN.  12/1 - Loose stools improving from 8 episodes 11/28 to only 2 so far today. Full liquid diet started today. Met with pt who reports eating some yogurt and ice cream today and drinking cranberry juice (353 calories, 9g protein) and has consumed 1/2 bottle of Ensure Complete (175 calories, 7g protein). C/o that the ice cream and Ensure Complete hurt his stomach. Pt reports he is still adjusting to the volume of food. Interested in trying other nutritional supplements.  Estimated PO/supplement intake so far today is 528 calories, 16g protein meeting 23% estimated calorie needs and 14% estimated protein needs.    TPN: Clinimix E 5/20 @ 70 ml/hr and lipids @ 10 ml/hr. Provides 1958 kcal, and 84 grams protein per day. Meets 87% minimum estimated energy needs and 76% minimum estimated protein needs.  - CBGs < 150mg /dL - Triglycerides WNL - PALB slightly elevated  CBG (last 3)   Recent Labs  07/15/13 2342 07/16/13 0642 07/16/13 1130  GLUCAP 111* 121* 105*     Height: Ht Readings from Last 1 Encounters:  07/09/13 6\' 2"  (1.88 m)    Weight Status:   Wt Readings from Last 1 Encounters:  07/16/13 136 lb 11 oz (62 kg)  Admit wt:        133 lb 4.8 oz (60.4 kg)  Net I/Os: -9.7L  Re-estimated needs:  Kcal: 2250-2550  Protein: 110-130g  Fluid: 2.2-2.5L/day   Skin: intact   Diet Order: Full Liquid   Intake/Output Summary (Last 24 hours) at 07/16/13 1554 Last data filed at 07/16/13 1010  Gross per 24 hour  Intake    760 ml  Output   1752 ml  Net   -992 ml    Last BM: 12/1   Labs:   Recent Labs Lab 07/11/13 0551 07/12/13 0420 07/13/13 0510 07/16/13 0610  NA 134* 135 134* 138  K 4.3 4.2 4.0 4.0  CL 99 100 98 103  CO2 28 29 28 28   BUN 12  11 13 11   CREATININE 0.61 0.57 0.54 0.54  CALCIUM 9.0 8.9 8.9 8.6  MG 2.1 2.4  --  2.1  PHOS 3.5 3.7  --  3.5  GLUCOSE 155* 160* 153* 128*    CBG (last 3)   Recent Labs  07/15/13 2342 07/16/13 0642 07/16/13 1130  GLUCAP 111* 121* 105*    Scheduled Meds: . amphetamine-dextroamphetamine  20 mg Oral Custom  . azaTHIOprine  150 mg Oral Daily  . folic acid  1 mg Oral Daily  . hyoscyamine  0.375 mg Oral Q12H  . insulin aspart  0-9 Units Subcutaneous TID AC & HS  . lamoTRIgine  200 mg Oral Daily  . methylPREDNISolone (SOLU-MEDROL) injection  20 mg Intravenous Q12H  . multivitamin with minerals  1 tablet Oral Daily  . sulfaSALAzine  1,500 mg Oral BID  . ziprasidone  40 mg Oral QPM  .  ziprasidone  80 mg Oral Q breakfast    Continuous Infusions: . Marland KitchenTPN (CLINIMIX-E) Adult 70 mL/hr at 07/15/13 1732   And  . fat emulsion 250 mL (07/15/13 1732)  . Marland KitchenTPN (CLINIMIX-E) Adult     And  . fat emulsion      Levon Hedger MS, RD, LDN [phone] Pager [phone] After Hours Pager

## 2013-07-16 NOTE — Progress Notes (Signed)
    Progress Note    Subjective  feels pretty well actually. No more than 3-4 loose stools in last 24 hours. No significant blood in stool. Abdominal pain manageable with vicodin.     Objective   Vital signs in last 24 hours: Temp:  [98.1 F (36.7 C)-98.7 F (37.1 C)] 98.7 F (37.1 C) (12/01 0624) Pulse Rate:  [94-105] 94 (12/01 0624) Resp:  [16-18] 16 (12/01 0624) BP: (99-110)/(61-62) 99/61 mmHg (12/01 0624) SpO2:  [96 %-98 %] 96 % (12/01 0624) Last BM Date: 07/16/13 General:    Pleasant white male in NAD Heart:  Regular rate and rhythm Abdomen:  Soft, nontender and nondistended.  Extremities:  Without edema. Neurologic:  Alert and oriented,  grossly normal neurologically. Psych:  Cooperative. Normal mood and affect.   Lab Results:  Recent Labs  07/16/13 0610  WBC 4.7  HGB 9.7*  HCT 29.3*  PLT 249   BMET  Recent Labs  07/16/13 0610  NA 138  K 4.0  CL 103  CO2 28  GLUCOSE 128*  BUN 11  CREATININE 0.54  CALCIUM 8.6   LFT  Recent Labs  07/16/13 0610  PROT 6.4  ALBUMIN 3.2*  AST 7  ALT 7  ALKPHOS 49  BILITOT 0.6     Assessment / Plan:   1. Severe universal ulcerative colitis. Improving. He received initial dose of Remicade last week.  Tolerating clears, interested in trying full liquids. Continue Azulfidine and Imuran. Will make further reduction in Solumedrol dose ( to 20mg  IV BID). Will try advancing to fulls. Continue TNA  2. Malnutrition. On TNA and clears. Will try full liquids.    3. ADD / anxiety / depression /history of psychosis. On Geodon, Lamictal and Adderall. Feels more anxious than normal, inquiring about adding Klonopin to Xanax. We are reducing steroids which may help.  4. Anemia, drop in hgb from 11.4 to 9.7 but the bleeding has about ceased. Will monitor hgb for now.     LOS: 7 days   [name]  07/16/2013, 8:01 AM   Attending MD note:   I have reviewed the above note, examined the patient and agree with plan of  treatment. Moderately severe UC  On recent colonoscopy, albumin 3.3, starting to turn around- on Remicade induction,Day#1 on 07/11/2013, Next dose Day #14,  Abdomen mildly tender, hyperactive bowl sounds. I agree with advancing  diet and adding Folic acid 1 mg qd. I have discussed with the patient possibility of home TNA vs dietary supplements. He will have to ambulate more before considering discharge.  Willa Rough [facility] Pager # [phone]

## 2013-07-17 LAB — GLUCOSE, CAPILLARY
Glucose-Capillary: 102 mg/dL — ABNORMAL HIGH (ref 70–99)
Glucose-Capillary: 123 mg/dL — ABNORMAL HIGH (ref 70–99)

## 2013-07-17 MED ORDER — INSULIN ASPART 100 UNIT/ML ~~LOC~~ SOLN
0.0000 [IU] | Freq: Three times a day (TID) | SUBCUTANEOUS | Status: DC
  Administered 2013-07-18 – 2013-07-20 (×4): 1 [IU] via SUBCUTANEOUS

## 2013-07-17 MED ORDER — CLINIMIX E/DEXTROSE (5/20) 5 % IV SOLN
INTRAVENOUS | Status: AC
  Administered 2013-07-17: 18:00:00 via INTRAVENOUS
  Filled 2013-07-17: qty 2000

## 2013-07-17 MED ORDER — MESALAMINE 1.2 G PO TBEC
4.8000 g | DELAYED_RELEASE_TABLET | Freq: Every day | ORAL | Status: DC
  Administered 2013-07-17 – 2013-07-22 (×6): 4.8 g via ORAL
  Filled 2013-07-17 (×7): qty 4

## 2013-07-17 MED ORDER — FAT EMULSION 20 % IV EMUL
250.0000 mL | INTRAVENOUS | Status: AC
  Administered 2013-07-17: 250 mL via INTRAVENOUS
  Filled 2013-07-17: qty 250

## 2013-07-17 MED ORDER — INSULIN ASPART 100 UNIT/ML ~~LOC~~ SOLN: 0.0000 [IU] | Freq: Every day | SUBCUTANEOUS | Status: DC

## 2013-07-17 NOTE — Progress Notes (Signed)
PARENTERAL NUTRITION CONSULT NOTE - Follow Up  Pharmacy Consult for TPN Indication: severe ulcerative colitis  Allergies  Allergen Reactions  . Cephalexin     REACTION: urticaria (hives)   Patient Measurements: Height: 6\' 2"  (188 cm) Weight: 136 lb 6.4 oz (61.871 kg) IBW/kg (Calculated) : 82.2 Appears he has lost 37# in last 7months  Vital Signs: Temp: 98.3 F (36.8 C) (12/02 0524) Temp src: Oral (12/02 0524) BP: 99/59 mmHg (12/02 0524) Pulse Rate: 115 (12/02 0524) Intake/Output from previous day: 12/01 0701 - 12/02 0700 In: 2880 [P.O.:960; TPN:1920] Out: 2752 [Urine:2750; Stool:2] Intake/Output from this shift:    Labs:  Recent Labs  07/16/13 0610  WBC 4.7  HGB 9.7*  HCT 29.3*  PLT 249    Recent Labs  07/16/13 0610  NA 138  K 4.0  CL 103  CO2 28  GLUCOSE 128*  BUN 11  CREATININE 0.54  CALCIUM 8.6  MG 2.1  PHOS 3.5  PROT 6.4  ALBUMIN 3.2*  AST 7  ALT 7  ALKPHOS 49  BILITOT 0.6  PREALBUMIN 38.0*  TRIG 48   Estimated Creatinine Clearance: 113.9 ml/min (by C-G formula based on Cr of 0.54).    Recent Labs  07/16/13 1729 07/16/13 2128 07/17/13 0731  GLUCAP 104* 112* 111*   Insulin Requirements in the past 24 hours:  CBG's < 120, Novolog sensitive scale ac/hs, 1 unit/ 24 hr  Nutritional Goals:  RD recs:  Kcal: 2250-2550  Protein: 110-130g  Fluid: 2.2-2.5L/day  Clinimix E 5/20 at a goal rate of 100 ml/hr + 20% fat emulsion at 10 ml/hr to provide: 120 g/day protein, 2592 Kcal/day.  Current Nutrition:  FLD, did not tolerate Ensure, trying Resource, Pro-Stat, Beneprotein  IVF: none  Assessment: 7 YOM with h/o ulcerative colitis, he underwent colonoscopy 11/21 which revealed severe UC. Appears he continues to experience abd pain, having 5-8 BM/day, and unable to eat.  He has been admitted to start TPN secondary to severe malnutrition evidenced by severe muscle wasting and subcutaneous fat loss throughout body with 27% weight loss in the  past 7-8 months per pt report (50 lb unintentional weight loss).  Plan per GI is to continue TNA and bowel rest, continue taper of solumedrol to 20 mg BID.  Awaiting Biopsy results from colonoscopy on 11/21. Received Remicade 11/26.  12/2: Continue to adjust Full liquid diet. No more loose stools last 24hrs. TNA is currently running slightly below goal rate at 70 m/hr since tolerating CLD.   Adderall 20 mg scheduled qid continued from PTA.   TPN day #9  Glucose -  CBGs below goal (< 120).  Solumedrol being tapered down, currently 20 mg IV q12h  Electrolytes -  WNL with no evidence of refeeding syndrome at this time. LFTs - WNL  Renal - Scr is WNL, CrCrl > 100 ml/min   TGs - 48 (12/1)  Prealbumin - 21.3 (11/24), 38.0 (12/1)- elevated artificially because of steroid  Adderall 20 mg scheduled qid continued from PTA.   TPN Access: PICC placed on 11/24  Plan:   Continue Clinimix E 5/20 at 70 ml/hr for now till able to consume more calories po  Continue 20% Fat emulsion at 9ml/hr  TNA will NOT contain MVI/TE as patient on oral multivitamin  Continue SSI (sensitive) and CBGs but change ACHS  TNA lab panels on Mondays & Thursdays.  Consider reduction of Adderall  Otho Bellows PharmD Pager [phone] 07/17/2013, 10:24 AM

## 2013-07-17 NOTE — Progress Notes (Signed)
NUTRITION FOLLOW UP  Intervention:   - TPN per pharmacy - Continue Resource Breeze BID, Beneprotein TID, Prostat BID - Diet advancement per MD - Will continue to monitor   Nutrition Dx:   Inadequate oral intake related to stomach pain, poor appetite as evidenced by pt report - ongoing   Goal:   Pt to consume >50% of meals/supplements - not met   Monitor:   Weights, labs, intake, stomach pain, loose stools, TPN  Assessment:   Pt discussed during multidisciplinary rounds. Pt with severe ulcerative colitis of the entire colon. He was diagnosed at least 13 years ago. His last colonoscopy on 11/21 showed severe erythema and several areas of deep ulcerations throughout the colon. Currently, on the prednisone, he is having 5-8 bowel movements a day without eating much at all. He sees intermittent blood in his stools.   11/25 - Met with pt who reports 50 pound unintended weight loss in the past 7-8 months, 15 pounds in the past 34 days. Pt reports for the past 3 months he has been living of off 2-3 containers of 6 ounces of Greek yogurt (around 120 calories, 12g protein). Reports PTA having 10-15 episodes of diarrhea today, denies any today. Pt's biggest complaint is constant fatigue. Plan is for bowel rest with TPN.  12/1 - Loose stools improving from 8 episodes 11/28 to only 2 so far today. Full liquid diet started today. Met with pt who reports eating some yogurt and ice cream today and drinking cranberry juice (353 calories, 9g protein) and has consumed 1/2 bottle of Ensure Complete (175 calories, 7g protein). C/o that the ice cream and Ensure Complete hurt his stomach. Pt reports he is still adjusting to the volume of food. Interested in trying other nutritional supplements. Estimated PO/supplement intake so far today is 528 calories, 16g protein meeting 23% estimated calorie needs and 14% estimated protein needs.   12/2 - Pt discussed during multidisciplinary rounds. Met with pt who told him  GI MD encouraged pt to consume 5-6 yogurts/day. Consumed 100% of 1 Resource Breeze and Beneprotein yesterday (total of 275 calories, 15g protein). Had not yet tried the Prostat. Reports having 4-5 loose stools so far today.    TPN: Clinimix E 5/20 @ 70 ml/hr and lipids @ 10 ml/hr. Provides 1958 kcal, and 84 grams protein per day. Meets 87% minimum estimated energy needs and 76% minimum estimated protein needs. Per discussion with pharmacist, pt on Adderall 20mg  QID continued from PTA which is affecting his appetite - discussed with GI NP. PO intake 25% of meals.   - CBGs < 150mg /dL - Triglycerides WNL - PALB slightly elevated  CBG (last 3)   Recent Labs  07/16/13 1729 07/16/13 2128 07/17/13 0731  GLUCAP 104* 112* 111*     Height: Ht Readings from Last 1 Encounters:  07/09/13 6\' 2"  (1.88 m)    Weight Status:   Wt Readings from Last 1 Encounters:  07/17/13 136 lb 6.4 oz (61.871 kg)  Admit wt:        133 lb 4.8 oz (60.4 kg)  Net I/Os: -9.5L  Re-estimated needs:  Kcal: 2250-2550  Protein: 110-130g  Fluid: 2.2-2.5L/day   Skin: intact   Diet Order: Full Liquid   Intake/Output Summary (Last 24 hours) at 07/17/13 1102 Last data filed at 07/17/13 0900  Gross per 24 hour  Intake   3000 ml  Output   2850 ml  Net    150 ml    Last BM: 12/2  Labs:   Recent Labs Lab 07/11/13 0551 07/12/13 0420 07/13/13 0510 07/16/13 0610  NA 134* 135 134* 138  K 4.3 4.2 4.0 4.0  CL 99 100 98 103  CO2 28 29 28 28   BUN 12 11 13 11   CREATININE 0.61 0.57 0.54 0.54  CALCIUM 9.0 8.9 8.9 8.6  MG 2.1 2.4  --  2.1  PHOS 3.5 3.7  --  3.5  GLUCOSE 155* 160* 153* 128*    CBG (last 3)   Recent Labs  07/16/13 1729 07/16/13 2128 07/17/13 0731  GLUCAP 104* 112* 111*    Scheduled Meds: . amphetamine-dextroamphetamine  20 mg Oral Custom  . azaTHIOprine  150 mg Oral Daily  . feeding supplement (PRO-STAT SUGAR FREE 64)  30 mL Oral BID PC  . feeding supplement (RESOURCE BREEZE)  1  Container Oral BID BM  . folic acid  1 mg Oral Daily  . hyoscyamine  0.375 mg Oral Q12H  . insulin aspart  0-5 Units Subcutaneous QHS  . insulin aspart  0-9 Units Subcutaneous TID WC  . lamoTRIgine  200 mg Oral Daily  . mesalamine  4.8 g Oral Q breakfast  . methylPREDNISolone (SOLU-MEDROL) injection  20 mg Intravenous Q12H  . multivitamin with minerals  1 tablet Oral Daily  . protein supplement  6 g Oral TID WC  . ziprasidone  40 mg Oral QPM  . ziprasidone  80 mg Oral Q breakfast    Continuous Infusions: . Marland KitchenTPN (CLINIMIX-E) Adult 70 mL/hr at 07/16/13 1722   And  . fat emulsion 250 mL (07/16/13 1723)  . Marland KitchenTPN (CLINIMIX-E) Adult     And  . fat emulsion      Levon Hedger MS, RD, LDN [phone] Pager [phone] After Hours Pager

## 2013-07-17 NOTE — Progress Notes (Signed)
    Progress Note   Subjective  Overall about the same. Full liquids caused slight increase in stools and cramping.    Objective   Vital signs in last 24 hours: Temp:  [98.2 F (36.8 C)-98.3 F (36.8 C)] 98.3 F (36.8 C) (12/02 0524) Pulse Rate:  [102-120] 115 (12/02 0524) Resp:  [16-18] 18 (12/02 0524) BP: (94-121)/(57-68) 99/59 mmHg (12/02 0524) SpO2:  [93 %-96 %] 94 % (12/02 0524) Weight:  [136 lb 6.4 oz (61.871 kg)-136 lb 11 oz (62 kg)] 136 lb 6.4 oz (61.871 kg) (12/02 0529) Last BM Date: 07/17/13 General:    White male in NAD Heart:  Regular rate and rhythm; no murmurs Lungs: Respirations even and unlabored, lungs CTA bilaterally Abdomen:  Soft, nontender and nondistended. Normal bowel sounds. Extremities:  Without edema. Neurologic:  Alert and oriented,  grossly normal neurologically. Psych:  Cooperative. Normal mood and affect.     Lab Results:  Recent Labs  07/16/13 0610  WBC 4.7  HGB 9.7*  HCT 29.3*  PLT 249   BMET  Recent Labs  07/16/13 0610  NA 138  K 4.0  CL 103  CO2 28  GLUCOSE 128*  BUN 11  CREATININE 0.54  CALCIUM 8.6   LFT  Recent Labs  07/16/13 0610  PROT 6.4  ALBUMIN 3.2*  AST 7  ALT 7  ALKPHOS 49  BILITOT 0.6      Assessment / Plan:   1. Severe universal ulcerative colitis. Improving. He received initial dose of Remicade last week. Tolerating clears and yogurt. She had slight increase in stools /cramps with fulls but will try again today. Continue Imuran. Azulfidine changed to Lialda (while hospitalized). Will make further reduction in Solumedrol dose (20mg  IV BID) within next couple of days. Continue TNA   2. Malnutrition. On TNA .    3. ADD / anxiety / depression /history of psychosis. On Geodon, Lamictal and Adderall.  We are reducing steroids which may help anxiety .  4. Anemia, drop in hgb from 11.4 to 9.7 but the bleeding has ceased.    LOS: 8 days   [name]  07/17/2013, 10:00 AM  Attending MD note:   I  have reviewed the above note, examined the patient and agree with plan of treatment..Nutritional parameters are favorable. The end point for stopping  TNA  will be when his oral intake approaches 1/2 of TNA calories ( 1900cal). Abdomen mildly tender, the stool frequency is no longer an issue, Next dose of Remicade about 07/25/2013. Prealbumin 38,  Alb 3.2, encourage ambulation.  [facility] Pager # [phone]

## 2013-07-18 LAB — GLUCOSE, CAPILLARY
Glucose-Capillary: 127 mg/dL — ABNORMAL HIGH (ref 70–99)
Glucose-Capillary: 102 mg/dL — ABNORMAL HIGH (ref 70–99)
Glucose-Capillary: 147 mg/dL — ABNORMAL HIGH (ref 70–99)
Glucose-Capillary: 130 mg/dL — ABNORMAL HIGH (ref 70–99)

## 2013-07-18 MED ORDER — RESOURCE INSTANT PROTEIN PO PWD PACKET
12.0000 g | Freq: Three times a day (TID) | ORAL | Status: DC
  Administered 2013-07-19 – 2013-07-21 (×7): 12 g via ORAL
  Filled 2013-07-18 (×15): qty 12

## 2013-07-18 MED ORDER — INFLUENZA VAC SPLIT QUAD 0.5 ML IM SUSP
0.5000 mL | INTRAMUSCULAR | Status: AC
  Administered 2013-07-19: 0.5 mL via INTRAMUSCULAR
  Filled 2013-07-18 (×2): qty 0.5

## 2013-07-18 MED ORDER — BOOST / RESOURCE BREEZE PO LIQD
1.0000 | Freq: Three times a day (TID) | ORAL | Status: DC
  Administered 2013-07-18 – 2013-07-22 (×13): 1 via ORAL

## 2013-07-18 MED ORDER — FAT EMULSION 20 % IV EMUL
250.0000 mL | INTRAVENOUS | Status: AC
  Administered 2013-07-18: 250 mL via INTRAVENOUS
  Filled 2013-07-18: qty 250

## 2013-07-18 MED ORDER — CLINIMIX E/DEXTROSE (5/15) 5 % IV SOLN
INTRAVENOUS | Status: AC
  Administered 2013-07-18: 17:00:00 via INTRAVENOUS
  Filled 2013-07-18: qty 2000

## 2013-07-18 NOTE — Progress Notes (Signed)
NUTRITION FOLLOW UP  Intervention:   - TPN per pharmacy - Increase Resource Breeze to TID, Beneprotein to 2 packets TID - D/C Prostat as pt not taking it - Provided pt with detailed diet/nutritional supplement schedule. Pt agreeable to trying to meet this nutritional plan and will start logging his intake of meals/supplements to assess oral intake.  - Diet advancement per MD - Will continue to monitor   Nutrition Dx:   Inadequate oral intake related to stomach pain, poor appetite as evidenced by pt report - ongoing   Goal:   Pt to consume >50% of meals/supplements - not met   Monitor:   Weights, labs, intake, stomach pain, loose stools, TPN  Assessment:   Pt discussed during multidisciplinary rounds. Pt with severe ulcerative colitis of the entire colon. He was diagnosed at least 13 years ago. His last colonoscopy on 11/21 showed severe erythema and several areas of deep ulcerations throughout the colon. Currently, on the prednisone, he is having 5-8 bowel movements a day without eating much at all. He sees intermittent blood in his stools.   11/25 - Met with pt who reports 50 pound unintended weight loss in the past 7-8 months, 15 pounds in the past 34 days. Pt reports for the past 3 months he has been living of off 2-3 containers of 6 ounces of Greek yogurt (around 120 calories, 12g protein). Reports PTA having 10-15 episodes of diarrhea today, denies any today. Pt's biggest complaint is constant fatigue. Plan is for bowel rest with TPN.  12/1 - Loose stools improving from 8 episodes 11/28 to only 2 so far today. Full liquid diet started today. Met with pt who reports eating some yogurt and ice cream today and drinking cranberry juice (353 calories, 9g protein) and has consumed 1/2 bottle of Ensure Complete (175 calories, 7g protein). C/o that the ice cream and Ensure Complete hurt his stomach. Pt reports he is still adjusting to the volume of food. Interested in trying other nutritional  supplements. Estimated PO/supplement intake so far today is 528 calories, 16g protein meeting 23% estimated calorie needs and 14% estimated protein needs.   12/2 - Pt discussed during multidisciplinary rounds. Met with pt who told him GI MD encouraged pt to consume 5-6 yogurts/day. Consumed 100% of 1 Resource Breeze and Beneprotein yesterday (total of 275 calories, 15g protein). Had not yet tried the Prostat. Reports having 4-5 loose stools so far today.  Per discussion with pharmacist, pt on Adderall 20mg  QID continued from PTA which is affecting his appetite - discussed with GI NP.   12/3 - Received page from Dr. Juanda Chance concerning pt's nutritional status. She encouraged RD to print out full liquid meal plan for pt with supplements, what he should order at each meal, and quantity. She thinks pt being on a detailed schedule would motivate him to reach his nutritional goals orally. Pt discussed during multidisciplinary rounds. Pt not consuming Prostat but is drinking Proofreader. Carefully constructed detailed meal plan for pt which was printed out and discussed with pt. Pt agreeable to this plan. Reports 7-8 loose stools since last evening. Intake and TPN discussed with pharmacist.   TPN: Clinimix E 5/20 @ 70 ml/hr and lipids @ 10 ml/hr. Provides 1958 kcal, and 84 grams protein per day. Meets 87% minimum estimated energy needs and 76% minimum estimated protein needs. PO intake and nutritional supplements making up for remaining calories/protein based on pt's reported intake.   - CBGs < 150mg /dL -  Triglycerides WNL - PALB slightly elevated  CBG (last 3)   Recent Labs  07/17/13 1752 07/17/13 2204 07/18/13 0724  GLUCAP 102* 123* 127*     Height: Ht Readings from Last 1 Encounters:  07/09/13 6\' 2"  (1.88 m)    Weight Status:   Wt Readings from Last 1 Encounters:  07/17/13 136 lb 6.4 oz (61.871 kg)  Admit wt:        133 lb 4.8 oz (60.4 kg)  Net I/Os:  -11.9L  Re-estimated needs:  Kcal: 2250-2550  Protein: 110-130g  Fluid: 2.2-2.5L/day   Skin: intact   Diet Order: Full Liquid   Intake/Output Summary (Last 24 hours) at 07/18/13 1017 Last data filed at 07/18/13 0505  Gross per 24 hour  Intake   1232 ml  Output   3600 ml  Net  -2368 ml    Last BM: 12/2   Labs:   Recent Labs Lab 07/12/13 0420 07/13/13 0510 07/16/13 0610  NA 135 134* 138  K 4.2 4.0 4.0  CL 100 98 103  CO2 29 28 28   BUN 11 13 11   CREATININE 0.57 0.54 0.54  CALCIUM 8.9 8.9 8.6  MG 2.4  --  2.1  PHOS 3.7  --  3.5  GLUCOSE 160* 153* 128*    CBG (last 3)   Recent Labs  07/17/13 1752 07/17/13 2204 07/18/13 0724  GLUCAP 102* 123* 127*    Scheduled Meds: . amphetamine-dextroamphetamine  20 mg Oral Custom  . azaTHIOprine  150 mg Oral Daily  . feeding supplement (PRO-STAT SUGAR FREE 64)  30 mL Oral BID PC  . feeding supplement (RESOURCE BREEZE)  1 Container Oral BID BM  . folic acid  1 mg Oral Daily  . hyoscyamine  0.375 mg Oral Q12H  . insulin aspart  0-5 Units Subcutaneous QHS  . insulin aspart  0-9 Units Subcutaneous TID WC  . lamoTRIgine  200 mg Oral Daily  . mesalamine  4.8 g Oral Q breakfast  . methylPREDNISolone (SOLU-MEDROL) injection  20 mg Intravenous Q12H  . multivitamin with minerals  1 tablet Oral Daily  . protein supplement  6 g Oral TID WC  . ziprasidone  40 mg Oral QPM  . ziprasidone  80 mg Oral Q breakfast    Continuous Infusions: . Marland KitchenTPN (CLINIMIX-E) Adult 70 mL/hr at 07/17/13 1740   And  . fat emulsion 250 mL (07/17/13 1740)    Levon Hedger MS, RD, LDN [phone] Pager [phone] After Hours Pager

## 2013-07-18 NOTE — Progress Notes (Signed)
PARENTERAL NUTRITION CONSULT NOTE - Follow Up  Pharmacy Consult for TPN Indication: severe ulcerative colitis  Allergies  Allergen Reactions  . Cephalexin     REACTION: urticaria (hives)   Patient Measurements: Height: 6\' 2"  (188 cm) Weight: 136 lb 6.4 oz (61.871 kg) IBW/kg (Calculated) : 82.2 Appears he has lost 37# in last 7months  Vital Signs: Temp: 98.8 F (37.1 C) (12/03 0504) Temp src: Oral (12/03 0504) BP: 101/58 mmHg (12/03 0504) Pulse Rate: 108 (12/03 0504) Intake/Output from previous day: 12/02 0701 - 12/03 0700 In: 1472 [P.O.:840; TPN:632] Out: 4200 [Urine:4200] Intake/Output from this shift: Total I/O In: 360 [P.O.:360] Out: 500 [Urine:500]  Labs:  Recent Labs  07/16/13 0610  WBC 4.7  HGB 9.7*  HCT 29.3*  PLT 249    Recent Labs  07/16/13 0610  NA 138  K 4.0  CL 103  CO2 28  GLUCOSE 128*  BUN 11  CREATININE 0.54  CALCIUM 8.6  MG 2.1  PHOS 3.5  PROT 6.4  ALBUMIN 3.2*  AST 7  ALT 7  ALKPHOS 49  BILITOT 0.6  PREALBUMIN 38.0*  TRIG 48   Estimated Creatinine Clearance: 113.9 ml/min (by C-G formula based on Cr of 0.54).    Recent Labs  07/17/13 2204 07/18/13 0724 07/18/13 1126  GLUCAP 123* 127* 102*   Insulin Requirements in the past 24 hours:  CBG's < 120, Novolog sensitive scale ac/hs, 1 unit/ 24 hr  Nutritional Goals:  RD recs:  Kcal: 2250-2550  Protein: 110-130g  Fluid: 2.2-2.5L/day  Clinimix E 5/20 at a goal rate of 100 ml/hr + 20% fat emulsion at 10 ml/hr would provide: 120 g/day protein, 2592 Kcal/day.  Current Nutrition:  FLD, did not tolerate Ensure, trying Resource, dislikes Pro-Stat, Beneprotein. Detailed schedule for po intake provided to patient by dietitian, hopefully will increase po intake  IVF: none  Assessment: 77 YOM with h/o ulcerative colitis, he underwent colonoscopy 11/21 which revealed severe UC. Appears he continues to experience abd pain, having 5-8 BM/day, and unable to eat.  He has been  admitted to start TPN secondary to severe malnutrition evidenced by severe muscle wasting and subcutaneous fat loss throughout body with 27% weight loss in the past 7-8 months per pt report (50 lb unintentional weight loss).  Plan per GI is to continue TNA and bowel rest, continue taper of solumedrol to 20 mg BID.  Awaiting Biopsy results from colonoscopy on 11/21. Received Remicade 11/26.  12/3: Continue to adjust Full liquid diet. Noted 7-8 loose stools last 24hrs per patient. TNA is currently running below goal rate at 70 m/hr with addition of po diet and to assist with appetite, do not want to increase TNA rate and blunt appetite Adderall 20 mg scheduled qid continued from PTA, max recommeded daily dose 60mg  daily. Noted not a problem PTA with appetite per 12/3 GI note.  TPN day #10  Glucose -  CBGs below goal (< 120).  Solumedrol being tapered down, currently 20 mg IV q12h  Electrolytes -  WNL with no evidence of refeeding syndrome at this time. LFTs - WNL  Renal - Scr is WNL, CrCrl > 100 ml/min   TGs - 48 (12/1)  Prealbumin - 21.3 (11/24), 38.0 (12/1)- elevated artificially because of steroid  Adderall 20 mg scheduled qid continued from PTA.   TPN Access: PICC placed on 11/24  Plan:   Change Clinimix formula to E 5/15 at current rate of 70 ml/hr ( will deliver 84 gm protein and 1670  kCal per 24 hr)  Continue 20% Fat emulsion at 9ml/hr  TNA will NOT contain MVI/TE as patient on oral multivitamin  Continue SSI (sensitive) and CBGs but change ACHS  TNA lab panels on Mondays & Thursdays.  Otho Bellows PharmD Pager [phone] 07/18/2013, 11:42 AM

## 2013-07-18 NOTE — Progress Notes (Signed)
    Progress Note   Subjective  overall feels okay. Still has lower abdominal pain with eating. Averaging about 4-6 loose to partially formed stools a day. Bleeding resolved. Tolerating yogurt, had cream to chicken last night.    Objective   Vital signs in last 24 hours: Temp:  [98.5 F (36.9 C)-98.8 F (37.1 C)] 98.8 F (37.1 C) (12/03 0504) Pulse Rate:  [104-117] 108 (12/03 0504) Resp:  [16-20] 20 (12/03 0504) BP: (101-103)/(51-72) 101/58 mmHg (12/03 0504) SpO2:  [95 %-97 %] 95 % (12/03 0504) Last BM Date: 07/17/13 General:    white male in NAD Heart:  Regular rate and rhythm Lungs: Respirations even and unlabored, lungs CTA bilaterally Abdomen:  Soft, nondistended, mild mid lower and LLQ tenderness. Normal bowel sounds. Extremities:  Without edema. Neurologic:  Alert and oriented,  grossly normal neurologically. Psych:  Cooperative. Normal mood and affect.   Lab Results:  Recent Labs  07/16/13 0610  WBC 4.7  HGB 9.7*  HCT 29.3*  PLT 249   BMET  Recent Labs  07/16/13 0610  NA 138  K 4.0  CL 103  CO2 28  GLUCOSE 128*  BUN 11  CREATININE 0.54  CALCIUM 8.6   LFT  Recent Labs  07/16/13 0610  PROT 6.4  ALBUMIN 3.2*  AST 7  ALT 7  ALKPHOS 49  BILITOT 0.6      Assessment / Plan:   1. Severe universal ulcerative colitis. Improving. He received initial dose of Remicade last week, due for 2nd dose 2 weeks after first one. Tolerating clears and certain full liquid items. Continue Imuran. Azulfidine changed to Lialda (while hospitalized). Will make further reduction in Solumedrol dose (20mg  IV BID) tomorrow. Continue TNA   2. Malnutrition. On TNA . Dietician working closely with patient to supplement TNA to meet nutritional needs. It would be ideal for patient not to need TNA at home. PICC line is a potential source of infection in this immunocompromised patient.   3. ADD / anxiety / depression /history of psychosis. On Geodon, Lamictal and Adderall. We are  reducing steroids which may help anxiety. High dose Adderall probably not helping appetite but patient has apparently been on that dose for years without too much problem with appetite / weight loss.    LOS: 9 days   [name]  07/18/2013, 11:08 AM Attending MD note:   I have taken a history, examined the patient, and reviewed the chart. I agree with the Advanced Practitioner's impression and recommendations. I am concerned about the high dose of Adderrall which may be affecting patient's appetite. We should also start reducing his narcotics as he is getting better. I would like to see him to take at least 1000 calories a day before discharge  Willa Rough [facility]enter[facility] Pager # [phone]

## 2013-07-19 LAB — COMPREHENSIVE METABOLIC PANEL
ALT: 9 U/L (ref 0–53)
AST: 10 U/L (ref 0–37)
Albumin: 3.2 g/dL — ABNORMAL LOW (ref 3.5–5.2)
Alkaline Phosphatase: 51 U/L (ref 39–117)
BUN: 14 mg/dL (ref 6–23)
CO2: 30 mEq/L (ref 19–32)
Calcium: 8.9 mg/dL (ref 8.4–10.5)
GFR calc Af Amer: >90 mL/min (ref >90)
Glucose, Bld: 121 mg/dL — ABNORMAL HIGH (ref 70–99)
Potassium: 4.2 mEq/L (ref 3.5–5.1)
Sodium: 138 mEq/L (ref 135–145)
Total Protein: 6.5 g/dL (ref 6.0–8.3)

## 2013-07-19 LAB — GLUCOSE, CAPILLARY: Glucose-Capillary: 108 mg/dL — ABNORMAL HIGH (ref 70–99)

## 2013-07-19 LAB — MAGNESIUM: Magnesium: 2.2 mg/dL (ref 1.5–2.5)

## 2013-07-19 MED ORDER — SODIUM CHLORIDE 0.9 % IV SOLN
INTRAVENOUS | Status: DC | PRN
  Administered 2013-07-18: 1000 mL via INTRAVENOUS
  Administered 2013-07-20: via INTRAVENOUS

## 2013-07-19 MED ORDER — LOPERAMIDE HCL 1 MG/5ML PO LIQD
2.0000 mg | Freq: Every evening | ORAL | Status: DC | PRN
  Administered 2013-07-19: 2 mg via ORAL
  Filled 2013-07-19: qty 10

## 2013-07-19 MED ORDER — METHYLPREDNISOLONE SODIUM SUCC 40 MG IJ SOLR
15.0000 mg | Freq: Two times a day (BID) | INTRAMUSCULAR | Status: DC
  Administered 2013-07-19 – 2013-07-22 (×6): 15.2 mg via INTRAVENOUS
  Filled 2013-07-19 (×6): qty 0.38

## 2013-07-19 MED ORDER — FAT EMULSION 20 % IV EMUL
250.0000 mL | INTRAVENOUS | Status: DC
  Administered 2013-07-19: 250 mL via INTRAVENOUS
  Filled 2013-07-19: qty 250

## 2013-07-19 MED ORDER — CLINIMIX E/DEXTROSE (5/15) 5 % IV SOLN
INTRAVENOUS | Status: DC
Start: 2013-07-19 — End: 2013-07-20
  Administered 2013-07-19: 18:00:00 via INTRAVENOUS
  Filled 2013-07-19: qty 2000

## 2013-07-19 NOTE — Progress Notes (Signed)
    Progress Note   Subjective  Had approximately 8 BMs in last 24 hours, most of which were small volume.    Objective   Vital signs in last 24 hours: Temp:  [97.4 F (36.3 C)-98.5 F (36.9 C)] 97.4 F (36.3 C) (12/04 0524) Pulse Rate:  [98-120] 99 (12/04 0524) Resp:  [16-18] 16 (12/04 0524) BP: (100-106)/(55-74) 105/74 mmHg (12/04 0524) SpO2:  [92 %-99 %] 99 % (12/04 0524) Weight:  [139 lb 3.2 oz (63.141 kg)] 139 lb 3.2 oz (63.141 kg) (12/04 0504) Last BM Date: 07/19/13 (has had several today ) General:    white male in NAD Heart:  Regular rate and rhythm Abdomen:  Soft, nontender and nondistended. Normal bowel sounds. Extremities:  Without edema. Neurologic:  Alert and oriented,  grossly normal neurologically. Psych:  Cooperative. Normal mood and affect.  BMET  Recent Labs  07/19/13 0448  NA 138  K 4.2  CL 102  CO2 30  GLUCOSE 121*  BUN 14  CREATININE 0.48*  CALCIUM 8.9   LFT  Recent Labs  07/19/13 0448  PROT 6.5  ALBUMIN 3.2*  AST 10  ALT 9  ALKPHOS 51  BILITOT 0.6     Assessment / Plan:   1. Severe universal ulcerative colitis. Improving.    Will make further decrease in solumedrol today, hopefully transition to prednisone soon. Continue Lialda and Imuran.   He will need 2nd Remicade infusion next Wednesday.    Having nocturnal diarrhea, will add 2mg  Imodium at bedtime   2. Malnutrition. Dietician formulated meal plan to meet daily nutritional requirements. Patient did well with meal plan yesterday. Will try low residue diet for dinner today. Continue TNA for now   3. 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 On Geodon, Lamictal, Adderall and Xanax.   **Just spoke with dietician. Patient consumed 3000calories and 100gms protein yesterday !! Will advance to low residue for lunch. If tolerates low residue then we can look at weaning TNA soon.     LOS: 10 days   [name]  07/19/2013, 9:45 AM  Attending MD note:   I have taken a  history, examined the patient, and reviewed the chart. I agree with the Advanced Practitioner's impression and recommendations. He is improving  Overall. Will have to work on reducing his pain meds. Otherwise his caloris intake is excellent  Willa Rough [facility] Pager # [phone]

## 2013-07-19 NOTE — Progress Notes (Signed)
Calorie Count Note  Diet: Full liquid Supplements: Resource Breeze TID, Beneprotein 2 packets TID  12/3 Breakfast: 1228 calories, 33g protein Dinner: 976 calories, 28g protein Supplements: 800 calories, 39g protein  Total intake: 3000 kcal (118% of minimum estimated needs)  100g protein (91% of minimum estimated needs)  Discussed with NP. Plan is to advance to low fiber diet at lunch, assess tolerance, and then evaluate need/rate for TPN.   Levon Hedger MS, RD, LDN [phone] Pager [phone] After Hours Pager

## 2013-07-19 NOTE — Progress Notes (Addendum)
PARENTERAL NUTRITION CONSULT NOTE - Follow Up  Pharmacy Consult for TPN Indication: severe ulcerative colitis  Allergies  Allergen Reactions  . Cephalexin     REACTION: urticaria (hives)   Patient Measurements: Height: 6\' 2"  (188 cm) Weight: 139 lb 3.2 oz (63.141 kg) IBW/kg (Calculated) : 82.2 Appears he has lost 37# in last 7months  Vital Signs: Temp: 97.4 F (36.3 C) (12/04 0524) Temp src: Oral (12/04 0524) BP: 105/74 mmHg (12/04 0524) Pulse Rate: 99 (12/04 0524) Intake/Output from previous day: 12/03 0701 - 12/04 0700 In: 4931.2 [P.O.:1360; TPN:3283.2] Out: 2750 [Urine:2750] Intake/Output from this shift:    Labs: No results found for this basename: WBC, HGB, HCT, PLT, APTT, INR,  in the last 72 hours  Recent Labs  07/19/13 0448  NA 138  K 4.2  CL 102  CO2 30  GLUCOSE 121*  BUN 14  CREATININE 0.48*  CALCIUM 8.9  MG 2.2  PHOS 4.0  PROT 6.5  ALBUMIN 3.2*  AST 10  ALT 9  ALKPHOS 51  BILITOT 0.6   Estimated Creatinine Clearance: 116.1 ml/min (by C-G formula based on Cr of 0.48).    Recent Labs  07/18/13 1649 07/18/13 2154 07/19/13 0808  GLUCAP 147* 130* 108*   Insulin Requirements in the past 24 hours:  Novolog sensitive scale ac/hs, 2 units used in past 24 hrs  Nutritional Goals:  RD recs:  Kcal: 2250-2550  Protein: 110-130g  Fluid: 2.2-2.5L/day  Clinimix E 5/20 at a goal rate of 100 ml/hr + 20% fat emulsion at 10 ml/hr would provide: 120 g/day protein, 2592 Kcal/day.  Current Nutrition:  FLD - Dietary following specific diet plan and caloric intake  IVF: none  Assessment: 33 YOM with h/o ulcerative colitis, he underwent colonoscopy 11/21 which revealed severe UC. Appears he continues to experience abd pain, having 5-8 BM/day, and unable to eat.  He has been admitted to start TPN secondary to severe malnutrition evidenced by severe muscle wasting and subcutaneous fat loss throughout body with 27% weight loss in the past 7-8 months per pt  report (50 lb unintentional weight loss).  Plan per GI is to continue TNA and bowel rest, continue taper of solumedrol to 20 mg BID.  Awaiting Biopsy results from colonoscopy on 11/21. Received Remicade 11/26, plan to repeat in two weeks which will be 12/10  12/4: Continue to adjust Full liquid diet per RD. Had approximately 8 BM in last 24hrs, most of which were small volume.  TNA is currently running below goal rate at 70 m/hr with addition of po diet and to assist with appetite, do not want to increase TNA rate and blunt appetite -Discussed patient with RD, Patient had total intake of 3000kcal yesterday and 100 g protein - Hopeful that patient will tolerate low fiber diet at lunch and can wean TNA.  Will hold off on re-ordering TNA until plan established this afternoon.  --Adderall 20 mg scheduled qid continued from PTA, max recommeded daily dose 60mg  daily. Could be affecting appetite--   Labs  Glucose -  CBGs below goal (< 120).  Solumedrol being tapered down, currently 20 mg IV q12h  Electrolytes -  WNL with no evidence of refeeding syndrome at this time. LFTs - WNL  Renal - Scr is WNL, CrCrl > 100 ml/min   TGs - 48 (12/1)  Prealbumin - 21.3 (11/24), 38.0 (12/1)- elevated artificially because of steroid  TPN Access: PICC placed on 11/24  Plan:   Will f/u with weaning TNA before  entering new orders, patient sleeping and has not yet received meal tray  Continue 20% Fat emulsion at 92ml/hr  TNA will NOT contain MVI/TE as patient on oral multivitamin  Continue SSI (sensitive) and CBGs but change ACHS  TNA lab panels on Mondays & Thursdays.  [name], [name] PharmD Pager #: [phone] 1:49 PM 07/19/2013  Afternoon Addendum Per PA, Continue TNA at current rate as patient has not had much PO intake today, will re-order Clinimix E 5/15 at 70 ml/hr  Geraldine Sandberg, [name] PharmD Pager #: [phone] 3:10 PM 07/19/2013

## 2013-07-20 DIAGNOSIS — R633 Feeding difficulties, unspecified: Secondary | ICD-10-CM

## 2013-07-20 DIAGNOSIS — D849 Immunodeficiency, unspecified: Secondary | ICD-10-CM

## 2013-07-20 LAB — GLUCOSE, CAPILLARY
Glucose-Capillary: 85 mg/dL (ref 70–99)
Glucose-Capillary: 136 mg/dL — ABNORMAL HIGH (ref 70–99)
Glucose-Capillary: 91 mg/dL (ref 70–99)

## 2013-07-20 MED ORDER — CLINIMIX E/DEXTROSE (5/15) 5 % IV SOLN: INTRAVENOUS | Status: AC

## 2013-07-20 MED ORDER — CLINIMIX E/DEXTROSE (5/15) 5 % IV SOLN: INTRAVENOUS | Status: DC

## 2013-07-20 MED ORDER — FAT EMULSION 20 % IV EMUL: 250.0000 mL | INTRAVENOUS | Status: DC

## 2013-07-20 MED ORDER — FAT EMULSION 20 % IV EMUL: 250.0000 mL | INTRAVENOUS | Status: AC

## 2013-07-20 NOTE — Progress Notes (Signed)
PARENTERAL NUTRITION CONSULT NOTE - Follow Up  Pharmacy Consult for TPN Indication: severe ulcerative colitis  Allergies  Allergen Reactions  . Cephalexin     REACTION: urticaria (hives)   Patient Measurements: Height: 6\' 2"  (188 cm) Weight: 139 lb 3.2 oz (63.141 kg) IBW/kg (Calculated) : 82.2 Appears he has lost 37# in last 7months  Vital Signs: Temp: 97.9 F (36.6 C) (12/05 0556) Temp src: Oral (12/05 0556) BP: 95/66 mmHg (12/05 0556) Pulse Rate: 74 (12/05 0556) Intake/Output from previous day: 12/04 0701 - 12/05 0700 In: 2765.3 [P.O.:1080; I.V.:265; TPN:1420.3] Out: 1775 [Urine:1775] Intake/Output from this shift: Total I/O In: 1218.3 [P.O.:360; I.V.:171.7; TPN:686.7] Out: -   Labs: No results found for this basename: WBC, HGB, HCT, PLT, APTT, INR,  in the last 72 hours  Recent Labs  07/19/13 0448  NA 138  K 4.2  CL 102  CO2 30  GLUCOSE 121*  BUN 14  CREATININE 0.48*  CALCIUM 8.9  MG 2.2  PHOS 4.0  PROT 6.5  ALBUMIN 3.2*  AST 10  ALT 9  ALKPHOS 51  BILITOT 0.6   Estimated Creatinine Clearance: 116.1 ml/min (by C-G formula based on Cr of 0.48).    Recent Labs  07/19/13 1708 07/19/13 2142 07/20/13 0803  GLUCAP 125* 85 136*   Insulin Requirements in the past 24 hours:  Novolog sensitive scale ac/hs, 2 units used in past 24 hrs  Nutritional Goals:  RD recs:  Kcal: 2250-2550  Protein: 110-130g  Fluid: 2.2-2.5L/day  Clinimix E 5/20 at a goal rate of 100 ml/hr + 20% fat emulsion at 10 ml/hr would provide: 120 g/day protein, 2592 Kcal/day.  Current Nutrition:  Low fiber diet Clinimix E 5/15 at 70 mL/hr + 20% Fat Emulsion at 10 mL/hr   Assessment: 64 YOM with h/o ulcerative colitis, he underwent colonoscopy 11/21 which revealed severe UC. Appears he continues to experience abd pain, having 5-8 BM/day, and unable to eat.  He has been admitted to start TPN secondary to severe malnutrition evidenced by severe muscle wasting and subcutaneous fat  loss throughout body with 27% weight loss in the past 7-8 months per pt report (50 lb unintentional weight loss).  Plan per GI is to continue TNA and bowel rest, continue taper of solumedrol to 20 mg BID.  Awaiting Biopsy results from colonoscopy on 11/21. Received Remicade 11/26, plan to repeat in two weeks which will be 12/10  12/5:  On low fiber diet, a little more abdominal discomfort but tolerable.   Spoke with patient and gastroenterology PA - both would like to proceed with weaning TNA to "off" tonight.   Plan:  1. Reduce Clinimix-E 5/15 current bag to 50 mL/hr (done).  Continue Fat Emulsion at 10 mL/hr. 2. At 12 noon, reduce Clinimix to 40 mL/hr. 3. At 6pm, discontinue both Clinimix and Fat Emulsion.  Reviewed with RN and with patient.  Hope Budds PharmD, BCPS Pager #: [phone] 10:00 AM 07/20/2013

## 2013-07-20 NOTE — Progress Notes (Signed)
    Progress Note   Subjective  feels okay. Solids didn't really increase BMs but he did have a little more abdominal discomfort.    Objective   Vital signs in last 24 hours: Temp:  [97.9 F (36.6 C)-98.4 F (36.9 C)] 97.9 F (36.6 C) (12/05 0556) Pulse Rate:  [74-120] 74 (12/05 0556) Resp:  [16-20] 18 (12/05 0556) BP: (95-123)/(66-78) 95/66 mmHg (12/05 0556) SpO2:  [98 %-100 %] 98 % (12/05 0556) Last BM Date: 07/20/13 General:    white male in NAD Heart:  Regular rate and rhythm Lungs: Respirations even and unlabored, lungs CTA bilaterally Abdomen:  Soft, nontender and nondistended. Normal bowel sounds. Extremities:  Without edema. Neurologic:  Alert and oriented,  grossly normal neurologically. Psych:  Cooperative. Normal mood and affect.   Recent Labs  07/19/13 0448  NA 138  K 4.2  CL 102  CO2 30  GLUCOSE 121*  BUN 14  CREATININE 0.48*  CALCIUM 8.9   LFT  Recent Labs  07/19/13 0448  PROT 6.5  ALBUMIN 3.2*  AST 10  ALT 9  ALKPHOS 51  BILITOT 0.6    Assessment / Plan:   1. Severe universal ulcerative colitis. Improving.  Solumedrol decreased to 15mg  BID yesterday. Will transition to prednisone soon. Continue Lialda and Imuran.  He will need 2nd Remicade infusion next Wednesday.   2. Malnutrition. Dietician formulated meal plan to meet daily nutritional requirements. Patient doing okay with low fiber diet. I spoke to dietician and pharmacist. We will start TNA wean this am. Patient motivated to get off TNA.    3. 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 On Geodon, Lamictal, Adderall and Xanax.    LOS: 11 days   [name]  07/20/2013, 9:33 AM   Attending MD note:   I have taken a history, examined the patient, and reviewed the chart. I agree with the Advanced Practitioner's impression and recommendations. He is doing very well with his caloric intake, so he will be gradually tapered off cTNA by tomorrow. Still taking 6-8  Hydrocodone/day.Hopefully he will be able to be D/C'd in next 48 hours, will switch to oral steroids tomorrow  Willa [facility] Pager # [phone]

## 2013-07-20 NOTE — Progress Notes (Signed)
Calorie Count Note  Diet: Full liquid 12/3-12/4, Low fiber starting at lunch on 12/4 Supplements: Resource Breeze TID, Beneprotein 2 packets TID  12/3 Breakfast: 1228 calories, 33g protein Dinner: 976 calories, 28g protein Supplements: 800 calories, 39g protein  12/4  Breakfast: 220 calories, 8g protein Lunch: 619, 28g protein Supplements: 800 calories, 39g protein  Total daily average from past 2 days: 2321 calories (100% estimated calorie needs) 88g protein (80% estimated protein needs)  Diet advanced to low fiber at lunch yesterday. Pt reports having some cramping after eating solid food however he reports he knew it would happen and worked through it. Pt discussed during multidisciplinary rounds. Discussed nutrition with Gunnar Fusi, NP and Harvie Heck, pharmD, plan is to start weaning TPN and d/c tonight.   Levon Hedger MS, RD, LDN [phone] Pager [phone] After Hours Pager

## 2013-07-21 NOTE — Progress Notes (Addendum)
Pt's BP this AM was 88/46. MD on call with [facility] paged, Dr. Juanda Chance responded, no new orders received. Eugene Garnet RN

## 2013-07-21 NOTE — Progress Notes (Signed)
    Progress Note   Subjective  feels okay, tolerating solids. BMs about the same (4-6 daily). He is trying to cut back on pain meds   Objective   Vital signs in last 24 hours: Temp:  [97.6 F (36.4 C)-98.5 F (36.9 C)] 97.6 F (36.4 C) (12/06 0546) Pulse Rate:  [75-125] 75 (12/06 0546) Resp:  [16-18] 18 (12/06 0546) BP: (88-107)/(46-75) 88/46 mmHg (12/06 0546) SpO2:  [96 %-100 %] 96 % (12/06 0546) Last BM Date: 07/20/13 General:    white male in NAD Heart:  Regular rate and rhythm Lungs: Respirations even and unlabored, lungs CTA bilaterally Abdomen:  Soft, nontender and nondistended. Normal bowel sounds. Extremities:  Without edema. Neurologic:  Alert and oriented,  grossly normal neurologically. Psych:  Cooperative. Normal mood and affect.  BMET  Recent Labs  07/19/13 0448  NA 138  K 4.2  CL 102  CO2 30  GLUCOSE 121*  BUN 14  CREATININE 0.48*  CALCIUM 8.9   LFT  Recent Labs  07/19/13 0448  PROT 6.5  ALBUMIN 3.2*  AST 10  ALT 9  ALKPHOS 51  BILITOT 0.6     Assessment / Plan:   1. Severe universal ulcerative colitis. Improving.  Solumedrol decreased to 15mg  BID two days ago. Will change to Prednisone 20mg  BID (spoke with pharmacist, 40mg  PO equiv to 30mg  IV) .  Continue Lialda and Imuran.  He will need 2nd Remicade infusion next Wednesday.   2. Malnutrition. Dietician formulated meal plan to meet daily nutritional requirements. Patient doing okay with low fiber diet. Weaned off TNA yesterday. He would like to try for discharge home tomorrow.  If continues to do okay then this is reasonable.  3. 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 On Geodon, Lamictal, Adderall and Xanax.     LOS: 12 days   [name]  07/21/2013, 9:18 AM Attending MD note:   I have taken a history, examined the patient, and reviewed the chart. I agree with the Advanced Practitioner's impression and recommendations.   Willa Rough  Gastroenterology Pager # [phone] new

## 2013-07-22 MED ORDER — HYOSCYAMINE SULFATE ER 0.375 MG PO TB12: 0.3750 mg | ORAL_TABLET | Freq: Two times a day (BID) | ORAL | Status: DC | PRN

## 2013-07-22 MED ORDER — FOLIC ACID 1 MG PO TABS: 1.0000 mg | ORAL_TABLET | Freq: Every day | ORAL | Status: DC

## 2013-07-22 MED ORDER — HYDROCODONE-ACETAMINOPHEN 5-325 MG PO TABS
1.0000 | ORAL_TABLET | ORAL | Status: DC | PRN
Start: 2013-07-22 — End: 2013-08-01

## 2013-07-22 MED ORDER — ONDANSETRON HCL 4 MG PO TABS: 4.0000 mg | ORAL_TABLET | Freq: Four times a day (QID) | ORAL | Status: DC | PRN

## 2013-07-22 MED ORDER — PREDNISONE 20 MG PO TABS: 20.0000 mg | ORAL_TABLET | Freq: Two times a day (BID) | ORAL | Status: DC

## 2013-07-22 NOTE — Progress Notes (Signed)
Patient was stable at time of discharge. Reviewed discharge education with patient. He verbalized understanding and had no further questions. Patient left with his belongings and prescriptions.

## 2013-07-22 NOTE — Discharge Summary (Signed)
Discharge Summary   Date of Admission: 07/09/2013  3:17 PM Date of Discharge: 07/22/2013 Primary Gastroenterologist: Benjamin Heaps, MD Discharging Physician: Benjamin Sar, MD  Discharge Diagnosis: 1. Severe universal colitis, improving. Biologic therapy initiated this admission 2. Malnutrition / weight loss, improved. 3. ADHD / anxiety / depression / bipolar, stable on home medications 4. Macrocytic anemia, stable  Consultations: none  : GI Procedures:  Colonoscopy 07/06/13 [name]) COLON FINDINGS: Throughout the colon there were diffuse areas of  submucosal hemorrhage, severe erythema and multiple areas of deep  ulcerations. Inflammatory changes involve the entire colon.  Biopsies were taken throughout the colon. Retroflexed views  revealed no abnormalities.  Colon biopsies : moderately active chronic colitis    History/Physical Exam:  See Admission H&P  Admission HPI:  Done by Dr. Ilda Mori [name] is a 32 y.o. male who is a patient of Dr. [name]. He has severe ulcerative colitis of the entire colon. He was diagnosed at least 13 years ago. His last colonoscopy on 11/21 showed severe erythema and several areas of deep ulcerations throughout the colon. He is currently on Imuran 150 mg daily and Azulfidine as well. He had been on prednisone as an outpatient recently and that was increased on Friday. Dr. [name] plan was to see how he did over the weekend, but since he did not have much improvement it was decided that he should be brought into the hospital and managed as an inpatient. There has been discussion for him to start Remicade. A PPD was negative recently. He does appear very malnourished and says that he has not been eating much at all and has basically been surviving on yogurt. He has lost at least 30 pounds recently. Currently, on the prednisone, he is having 5-8 bowel movements a day without eating much at all. He sees intermittent blood in his stools. He  does complain of abdominal pain and takes tramadol as needed for that. The pain can reach a 10 out of 10 without pain medication in the Tramadol usually brings it down to about a 6/10. There has been some issues of noncompliance in the past, however, Dr. [name] recently spoke with him about potentially needing a colectomy if his disease continues to the severity   Hospital Course by problem list:  1. Severe universal colitis with malnutrition. Patient underwent colonoscopy 07/06/13 with findings of severe universal UC. He had lost an excessive amount of weight. Despite prednisone patient was doing poorly at home. The decision was made to admit patient to the hospital for IV steroids and TNA. He was admitted to [facility] on 07/09/13. Admission labs were fairly unremarkable including a pre-albumin of 21.3. Upon admission patient was placed on bowel rest and IV steroids. A PICC was placed, TNA initiated. Stool for c-diff was negative. Remicade was initiated on 07/11/13. Upon starting a clear liquid diet 07/13/13 patient had a slight increase in abdominal pain / loose stools but this was closely monitored. Patient continued to have rectal bleeding for the first several days of admission but then bleeding subsided. Diet was advanced to fulls on 12/1. The dietician worked closely with patient on food choices. She designed a detailed meal plan which would meet patient's daily nutritional requirements. He was advanced to a low fiber diet 07/19/13. Patient did okay with solids and was able to wean off TNA 07/20/13. Solumedrol was tapered over a several day period with transition to prednisone 40mg  daily on 07/21/13. By 07/22/13 patient was stable for  discharge. He would be due for 2nd Remicade on Wed 12/10 which our office would arrange for.   2. ADHD / anxiety / depression / bipolar disorder. Patient had some increase anxiety earlier in his admission but that leveled off with reduction in Solumedrol dose. Home  medications were continued  3. Macrocytic anemia, probably combination of rectal bleeding and ? nutritional deficiency given macrocytosis. Hgb dropped from 12.1 on admission to 9.7 on 07/16/13. Home on folate.  He will need repeat CBC at office follow up.    Discharge Vitals:  BP 116/60  Pulse 66  Temp(Src) 98.4 F (36.9 C) (Oral)  Resp 16  Ht 6\' 2"  (1.88 m)  Wt 139 lb 3.2 oz (63.141 kg)  BMI 17.86 kg/m2  SpO2 99%  Physical Exam General: pleasant white male in NAD, very thin Cardiac: RRR, no murmurs Pulmonary: Lungs CTA bilaterally Abdominal: soft, ND, mildly tender, active bowel sounds Extremity: no edema Neuro: Alert and oriented Psych: pleasant, cooperative  Discharge Labs: No results found for this or any previous visit (from the past 24 hour(s)).  Disposition and follow-up:   [name] was discharged from [facility] in stable condition.    Follow-up Appointments: He will call our office Monday for a hospital follow up appointment in 2-3 weeks.  Our office will contact him on Monday to schedule next Remicade infusion.    Discharge Medications:   Medication List    ASK your doctor about these medications       ALPRAZolam 1 MG tablet  Commonly known as:  XANAX  Take 2 mg by mouth 3 (three) times daily as needed for anxiety.     amphetamine-dextroamphetamine 20 MG tablet  Commonly known as:  ADDERALL  Take 20 mg by mouth 4 (four) times daily.     azaTHIOprine 50 MG tablet  Commonly known as:  IMURAN  Take 50 mg by mouth 3 (three) times daily.     lamoTRIgine 200 MG tablet  Commonly known as:  LAMICTAL  Take 1 tablet (200 mg total) by mouth daily. For mood stabilization.     multivitamin with minerals Tabs tablet  Take 1 tablet by mouth daily. As a nutritional supplement.     predniSONE 20 MG tablet  Commonly known as:  DELTASONE  Take 40 mg by mouth daily with breakfast.     sulfaSALAzine 500 MG tablet  Commonly known as:   AZULFIDINE  Take 1,500 mg by mouth 2 (two) times daily.     traMADol 50 MG tablet  Commonly known as:  ULTRAM  Take 1 tablet (50 mg total) by mouth every 6 (six) hours as needed (take 1-2 tablets by mouth every 6 hours as needed for pain.).     traZODone 100 MG tablet  Commonly known as:  DESYREL  Take 2 tablets (200 mg total) by mouth at bedtime as needed for sleep.     ziprasidone 40 MG capsule  Commonly known as:  GEODON  Take 40 mg by mouth 2 (two) times daily with a meal. Takes 80mg  qAM and 40mg  qPM     zolpidem 10 MG tablet  Commonly known as:  AMBIEN  Take 10 mg by mouth at bedtime as needed.        Signed: Willette [name] 07/22/2013, 9:51 AM   Attending MD note:   I have taken a history, examined the patient, and reviewed the chart. I agree with the Advanced Practitioner's impression and recommendations. Home on Sulfasalazine but will  try to get him on assistance ptogram for Lialda. Weight up to 139 lbs- gained 6 lbs. Follow up  With Dr [name] Gastroenterology Pager # [phone]

## 2013-07-23 ENCOUNTER — Other Ambulatory Visit: Payer: Self-pay

## 2013-07-23 DIAGNOSIS — K5190 Ulcerative colitis, unspecified, without complications: Secondary | ICD-10-CM

## 2013-07-24 ENCOUNTER — Telehealth: Payer: Self-pay

## 2013-07-24 ENCOUNTER — Other Ambulatory Visit: Payer: Self-pay

## 2013-07-24 ENCOUNTER — Telehealth: Payer: Self-pay | Admitting: Gastroenterology

## 2013-07-24 DIAGNOSIS — K5190 Ulcerative colitis, unspecified, without complications: Secondary | ICD-10-CM

## 2013-07-24 NOTE — Telephone Encounter (Signed)
Referral entered in epic for prior auth.

## 2013-07-24 NOTE — Telephone Encounter (Signed)
Pt stopped by the office and wanted to thank Dr. Arlyce Dice for helping him through this last flare and for all the help in the past.  He wants to know if it makes sense to you for him to be applying for disability. If not he would like to talk with you about it.

## 2013-07-25 ENCOUNTER — Encounter (HOSPITAL_COMMUNITY): Payer: Self-pay

## 2013-07-25 ENCOUNTER — Telehealth: Payer: Self-pay | Admitting: Gastroenterology

## 2013-07-25 ENCOUNTER — Other Ambulatory Visit (HOSPITAL_COMMUNITY): Payer: Self-pay | Admitting: Gastroenterology

## 2013-07-25 ENCOUNTER — Encounter (HOSPITAL_COMMUNITY)
Admission: RE | Admit: 2013-07-25 | Discharge: 2013-07-25 | Disposition: A | Discharge status: Home / Self Care | Payer: BC Managed Care – PPO | Source: Ambulatory Visit | Attending: Gastroenterology | Admitting: Gastroenterology

## 2013-07-25 VITALS — BP 112/67 | HR 113 | Temp 98.6°F | Resp 18 | Ht 74.0 in | Wt 145.0 lb

## 2013-07-25 DIAGNOSIS — K5190 Ulcerative colitis, unspecified, without complications: Secondary | ICD-10-CM | POA: Insufficient documentation

## 2013-07-25 MED ORDER — SODIUM CHLORIDE 0.9 % IV SOLN
Freq: Every day | INTRAVENOUS | Status: DC
  Administered 2013-07-25: 11:00:00 via INTRAVENOUS

## 2013-07-25 MED ORDER — SODIUM CHLORIDE 0.9 % IV SOLN
5.0000 mg/kg | Freq: Once | INTRAVENOUS | Status: AC
  Administered 2013-07-25: 300 mg via INTRAVENOUS
  Filled 2013-07-25: qty 30

## 2013-07-25 MED ORDER — DIPHENHYDRAMINE HCL 25 MG PO CAPS
50.0000 mg | ORAL_CAPSULE | Freq: Once | ORAL | Status: AC
  Administered 2013-07-25: 50 mg via ORAL
  Filled 2013-07-25: qty 2

## 2013-07-25 MED ORDER — INFLIXIMAB 100 MG IV SOLR
5.0000 mg/kg | Freq: Once | INTRAVENOUS | Status: DC
  Filled 2013-07-25: qty 30

## 2013-07-25 MED ORDER — ACETAMINOPHEN 325 MG PO TABS
650.0000 mg | ORAL_TABLET | Freq: Once | ORAL | Status: AC
  Administered 2013-07-25: 650 mg via ORAL
  Filled 2013-07-25: qty 2

## 2013-07-25 NOTE — Telephone Encounter (Signed)
He can apply for temporary disability.

## 2013-07-25 NOTE — Telephone Encounter (Signed)
FYI-Dr. Arlyce Dice he would like to discuss disability with you at his next OV.

## 2013-07-25 NOTE — Progress Notes (Signed)
Tolerated Remicade dose well in Short Stay today. VSS.

## 2013-07-25 NOTE — Telephone Encounter (Signed)
Pt aware.

## 2013-07-27 ENCOUNTER — Telehealth: Payer: Self-pay | Admitting: Gastroenterology

## 2013-07-30 NOTE — Telephone Encounter (Signed)
Pt called to let us know that he is doing better and he has gained a little weight. Pt wants to discuss disability with Dr. Arlyce Dice. Let pt know he can discuss this at his OV with Dr. Arlyce Dice. Also states he has FMLA papers that need to be filled out. Let pt know to drop those off with [employer] in the basement. Pt verbalized understanding.

## 2013-07-31 ENCOUNTER — Telehealth: Payer: Self-pay | Admitting: Gastroenterology

## 2013-07-31 ENCOUNTER — Other Ambulatory Visit: Payer: Self-pay | Admitting: Gastroenterology

## 2013-08-01 MED ORDER — HYDROCODONE-ACETAMINOPHEN 5-325 MG PO TABS: 1.0000 | ORAL_TABLET | ORAL | Status: DC | PRN

## 2013-08-01 NOTE — Telephone Encounter (Signed)
Called pt to come pick up script

## 2013-08-01 NOTE — Telephone Encounter (Signed)
Can patient have refill on Hydrocodone??

## 2013-08-01 NOTE — Telephone Encounter (Signed)
yes

## 2013-08-03 ENCOUNTER — Telehealth: Payer: Self-pay | Admitting: Gastroenterology

## 2013-08-03 ENCOUNTER — Ambulatory Visit: Payer: Self-pay | Admitting: Gastroenterology

## 2013-08-03 ENCOUNTER — Other Ambulatory Visit: Payer: Self-pay

## 2013-08-03 ENCOUNTER — Other Ambulatory Visit (INDEPENDENT_AMBULATORY_CARE_PROVIDER_SITE_OTHER): Payer: BC Managed Care – PPO

## 2013-08-03 DIAGNOSIS — K5190 Ulcerative colitis, unspecified, without complications: Secondary | ICD-10-CM

## 2013-08-03 DIAGNOSIS — R197 Diarrhea, unspecified: Secondary | ICD-10-CM

## 2013-08-03 LAB — CBC WITH DIFFERENTIAL/PLATELET
Basophils Absolute: 0 10*3/uL (ref 0.0–0.1)
Eosinophils Absolute: 0 10*3/uL (ref 0.0–0.7)
HCT: 35.1 % — ABNORMAL LOW (ref 39.0–52.0)
Hemoglobin: 11.9 g/dL — ABNORMAL LOW (ref 13.0–17.0)
Lymphocytes Relative: 33.7 % (ref 12.0–46.0)
Lymphs Abs: 0.8 10*3/uL (ref 0.7–4.0)
MCHC: 33.8 g/dL (ref 30.0–36.0)
Monocytes Relative: 3.8 % (ref 3.0–12.0)
Neutro Abs: 1.6 10*3/uL (ref 1.4–7.7)
Neutrophils Relative %: 61.8 % (ref 43.0–77.0)
Platelets: 247 10*3/uL (ref 150.0–400.0)
RDW: 16.5 % — ABNORMAL HIGH (ref 11.5–14.6)
WBC: 2.5 10*3/uL — ABNORMAL LOW (ref 4.5–10.5)

## 2013-08-03 LAB — BASIC METABOLIC PANEL
CO2: 30 mEq/L (ref 19–32)
Calcium: 8.7 mg/dL (ref 8.4–10.5)
Chloride: 104 mEq/L (ref 96–112)
Creatinine, Ser: 0.6 mg/dL (ref 0.4–1.5)
GFR: 163.81 mL/min (ref >60.00)
Sodium: 139 mEq/L (ref 135–145)

## 2013-08-03 MED ORDER — MESALAMINE 1.2 G PO TBEC: 4.8000 g | DELAYED_RELEASE_TABLET | Freq: Every day | ORAL | Status: DC

## 2013-08-03 NOTE — Telephone Encounter (Signed)
Please have him come in for labs today, CBC, BMP and stool studies, Cdiff, O&P, and culture.  Lets put him on Lialda 4.8 grams daily.  This medication is not going to work immediately so please let him know that.  Dr. Rhea Belton said there is really no benefit in increasing prednisone further.  Have him call with an update the beginning of next week and we will be in touch about stool results.  Low residue diet as well.  ER over the weekend if symptoms worsen.  Thank you,  Jess

## 2013-08-03 NOTE — Telephone Encounter (Signed)
Pt states he is having a flare, states it started yesterday. Pt states he is having increased BM's with blood in them, fatigue and abdominal pain. Pt states he is taking 40mg  of prednisone and would prefer to not increase the dose if he can help it due to the psych symptoms. He states he is taking Imuran 50mg  3 tabs a day.

## 2013-08-03 NOTE — Telephone Encounter (Signed)
Pt aware, orders in epic. Prescription sent to pharmacy. Pt to call Monday with an update.

## 2013-08-04 ENCOUNTER — Encounter: Payer: Self-pay | Admitting: Gastroenterology

## 2013-08-05 ENCOUNTER — Encounter: Payer: Self-pay | Admitting: Gastroenterology

## 2013-08-06 ENCOUNTER — Other Ambulatory Visit: Payer: BC Managed Care – PPO

## 2013-08-06 ENCOUNTER — Telehealth: Payer: Self-pay

## 2013-08-06 ENCOUNTER — Telehealth: Payer: Self-pay | Admitting: Gastroenterology

## 2013-08-06 DIAGNOSIS — R197 Diarrhea, unspecified: Secondary | ICD-10-CM

## 2013-08-06 DIAGNOSIS — K5190 Ulcerative colitis, unspecified, without complications: Secondary | ICD-10-CM

## 2013-08-06 MED ORDER — HYDROCODONE-ACETAMINOPHEN 5-325 MG PO TABS: 1.0000 | ORAL_TABLET | ORAL | Status: DC | PRN

## 2013-08-06 NOTE — Telephone Encounter (Signed)
yes

## 2013-08-06 NOTE — Telephone Encounter (Signed)
Script left up front for pt to pick up. 

## 2013-08-06 NOTE — Telephone Encounter (Signed)
Message copied by Chrystie Nose on Mon Aug 06, 2013  9:25 AM ------      Message from: Melvia Heaps D      Created: Mon Aug 06, 2013  8:28 AM       Instruct pt to decrease imuran to 100mg  qd      Repeat CBC Dec 26. ------

## 2013-08-06 NOTE — Telephone Encounter (Signed)
Pt states he dropped off his FMLA paperwork but has found out he doesn't need it until Jan 13th.  Pt wanted to know lab results and path results, reviewed results with pt.  Pt is requesting a refill on his pain medication. Received last script on 08/01/13 for a quantity of #30 that he can take every 4 hours. Is it ok to refill pain med? Please advise.

## 2013-08-06 NOTE — Telephone Encounter (Signed)
Spoke with pt and he is aware. 

## 2013-08-07 ENCOUNTER — Other Ambulatory Visit: Payer: Self-pay

## 2013-08-07 DIAGNOSIS — K5190 Ulcerative colitis, unspecified, without complications: Secondary | ICD-10-CM

## 2013-08-07 LAB — CLOSTRIDIUM DIFFICILE BY PCR: Toxigenic C. Difficile by PCR: NOT DETECTED

## 2013-08-10 ENCOUNTER — Other Ambulatory Visit: Payer: Self-pay | Admitting: Physician Assistant

## 2013-08-10 LAB — STOOL CULTURE

## 2013-08-12 ENCOUNTER — Telehealth: Payer: Self-pay | Admitting: Internal Medicine

## 2013-08-12 ENCOUNTER — Other Ambulatory Visit: Payer: Self-pay | Admitting: Internal Medicine

## 2013-08-12 MED ORDER — PROMETHAZINE HCL 25 MG PO TABS: 25.0000 mg | ORAL_TABLET | Freq: Four times a day (QID) | ORAL | Status: DC | PRN

## 2013-08-12 NOTE — Telephone Encounter (Signed)
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  I explained unable to Rx the hydrocodone tonight Will try promethazine - can see about refill tomorrow when office open  May need NP/PA visit

## 2013-08-13 ENCOUNTER — Telehealth: Payer: Self-pay | Admitting: *Deleted

## 2013-08-13 ENCOUNTER — Other Ambulatory Visit (INDEPENDENT_AMBULATORY_CARE_PROVIDER_SITE_OTHER): Payer: BC Managed Care – PPO

## 2013-08-13 ENCOUNTER — Other Ambulatory Visit: Payer: Self-pay | Admitting: Physician Assistant

## 2013-08-13 ENCOUNTER — Telehealth: Payer: Self-pay | Admitting: Gastroenterology

## 2013-08-13 DIAGNOSIS — K5190 Ulcerative colitis, unspecified, without complications: Secondary | ICD-10-CM

## 2013-08-13 LAB — CBC WITH DIFFERENTIAL/PLATELET
Basophils Absolute: 0 10*3/uL (ref 0.0–0.1)
Eosinophils Absolute: 0 10*3/uL (ref 0.0–0.7)
HCT: 40.5 % (ref 39.0–52.0)
Lymphocytes Relative: 18.5 % (ref 12.0–46.0)
MCHC: 33.3 g/dL (ref 30.0–36.0)
MCV: 105.7 fl — ABNORMAL HIGH (ref 78.0–100.0)
Monocytes Absolute: 0.1 10*3/uL (ref 0.1–1.0)
Neutrophils Relative %: 78.7 % — ABNORMAL HIGH (ref 43.0–77.0)
Platelets: 293 10*3/uL (ref 150.0–400.0)
RDW: 14.4 % (ref 11.5–14.6)

## 2013-08-13 MED ORDER — HYDROCODONE-ACETAMINOPHEN 5-325 MG PO TABS: 1.0000 | ORAL_TABLET | ORAL | Status: DC | PRN

## 2013-08-13 NOTE — Addendum Note (Signed)
Addended by: Annett Fabian on: 08/13/2013 01:32 PM   Modules accepted: Orders

## 2013-08-13 NOTE — Telephone Encounter (Signed)
Patient advised.

## 2013-08-13 NOTE — Telephone Encounter (Signed)
Give him one more refill and tell him that he is not to be taking it every 4 hours unless he absolutely needs it.  Also tell him that all of his stool studies were negative.  Thank you,  Jess

## 2013-08-13 NOTE — Telephone Encounter (Signed)
[facility] Triage Call Report Triage Record Num: [id number] Operator: Towanda Malkin Patient Name: [name] Call Date & Time: 08/11/2013 7:33:13PM Patient Phone: [phone] PCP: Patient Gender: Male PCP Fax : Patient DOB: [id number] Practice Name: [facility] Reason for Call: Caller: [name]/Patient; PCP: Sanda Linger (Adults only); CB#: [phone]; Call regarding Medication Issue; Medication(s): hydrocodone; Onset: 07/06/2013. Treatment: Hydrocodone 5/25mg  x 2 po on 08/09/2013. Triaged per Abdominal Pain Guideline. See ED Immediately disposition for "Generalized or localized pain maide worse with cough, movement, or standing up straight". Advised patient to go to ED for further evaluation, patient reported he would go to [facility], patient verbalized understanding. Advised to call back with any concerns. Protocol(s) Used: Abdominal Pain Recommended Outcome per Protocol: See ED Immediately Reason for Outcome: Generalized or localized pain made worse with cough, movement, or standing up straight Care Advice: ~ Do not eat or drink anything until evaluated by provider.

## 2013-08-13 NOTE — Telephone Encounter (Signed)
Needs follow-up call from office

## 2013-08-13 NOTE — Telephone Encounter (Signed)
See phone note from 08/13/13

## 2013-08-13 NOTE — Telephone Encounter (Signed)
Patient requesting refill of hydrocodone.  Doug Sou, PA please advise in Dr. Marzetta Board absence.  His last fill was on 08/06/13 for #30 and prior to that was 08/01/13 #30

## 2013-08-14 LAB — COMPREHENSIVE METABOLIC PANEL
ALT: 12 U/L (ref 0–53)
AST: 19 U/L (ref 0–37)
Albumin: 4.4 g/dL (ref 3.5–5.2)
Alkaline Phosphatase: 47 U/L (ref 39–117)
Calcium: 9.2 mg/dL (ref 8.4–10.5)
Chloride: 105 mEq/L (ref 96–112)
GFR: 130.61 mL/min (ref >60.00)
Glucose, Bld: 121 mg/dL — ABNORMAL HIGH (ref 70–99)
Potassium: 3.9 mEq/L (ref 3.5–5.1)
Sodium: 139 mEq/L (ref 135–145)
Total Protein: 7.5 g/dL (ref 6.0–8.3)

## 2013-08-14 NOTE — Telephone Encounter (Signed)
Medication sent and script out front

## 2013-08-15 ENCOUNTER — Telehealth: Payer: Self-pay | Admitting: Gastroenterology

## 2013-08-17 MED ORDER — HYDROCODONE-ACETAMINOPHEN 5-325 MG PO TABS: 1.0000 | ORAL_TABLET | ORAL | Status: DC | PRN

## 2013-08-17 NOTE — Telephone Encounter (Signed)
lmom for pt to call back

## 2013-08-17 NOTE — Telephone Encounter (Signed)
Informed pt of lab results and since Dr Arlyce DiceKaplan has not reviewed them, [facility] Pt asked that I leave him a copy and also asked for a refill on hydrocodone which Mike GipAmy Esterwood, PA signed. Instructed him that he needs to use NSAIDS and not take too many narcotics; pt stated understanding.

## 2013-08-20 ENCOUNTER — Encounter: Payer: Self-pay | Admitting: Gastroenterology

## 2013-08-20 ENCOUNTER — Ambulatory Visit (INDEPENDENT_AMBULATORY_CARE_PROVIDER_SITE_OTHER): Payer: BC Managed Care – PPO | Admitting: Gastroenterology

## 2013-08-20 VITALS — BP 116/60 | HR 60 | Ht 74.0 in | Wt 153.0 lb

## 2013-08-20 DIAGNOSIS — R633 Feeding difficulties, unspecified: Secondary | ICD-10-CM

## 2013-08-20 DIAGNOSIS — K5190 Ulcerative colitis, unspecified, without complications: Secondary | ICD-10-CM

## 2013-08-20 NOTE — Progress Notes (Signed)
          History of Present Illness:  [name] has returned following a two-week hospitalization for flareup of ulcerative colitis  He was placed at bowel rest, received TNA , IV steroids imuran and began Remicade infusions.  He slowly improved and was discharged on a low fiber diet, Imuran and prednisone 40 mg daily.  He received his second Remicade infusion.  His main complaints are extreme fatigue and abdominal pain.  He takes hyomax and codeine as needed.  He has slowly gained weight.  He is having anywhere from 2-6 bowel movements a day which are loose and intermittently accompanied with blood.    Review of Systems: Pertinent positive and negative review of systems were noted in the above HPI section. All other review of systems were otherwise negative.    Current Medications, Allergies, Past Medical History, Past Surgical History, Family History and Social History were reviewed in [employer] electronic medical record  Vital signs were reviewed in today's medical record. Physical Exam: General: He is a thin, chronically ill-appearing male Chest is clear There are no cardiac murmurs gallops or rubs Abdomen is without masses, tenderness organomegaly   See Assessment and Plan under Problem List

## 2013-08-20 NOTE — Assessment & Plan Note (Signed)
He continues to gain weight.  He was carefully forewarned about the importance of maintaining nutrition.

## 2013-08-20 NOTE — Patient Instructions (Signed)
You will return in one month for labs Then follow up after your labs are drawn in one month

## 2013-08-20 NOTE — Assessment & Plan Note (Signed)
Continues moderately symptomatic having to 6 bowel movements a day along with some bleeding.  Recommendations #1 continue current medications.  Lower prednisone to 30 mg daily #2 if symptoms plateau while on Remicade I would consider another biologic or seek a second opinion at a tertiary care center

## 2013-08-23 ENCOUNTER — Telehealth: Payer: Self-pay | Admitting: Gastroenterology

## 2013-08-23 NOTE — Telephone Encounter (Signed)
Dr Arlyce Dice, [name] called today for a Hydrocodone refill. We just gave him a refill on the 5th. I told him I had to ask you first

## 2013-08-24 MED ORDER — HYDROCODONE-ACETAMINOPHEN 5-325 MG PO TABS: 1.0000 | ORAL_TABLET | ORAL | Status: DC | PRN

## 2013-08-24 NOTE — Telephone Encounter (Signed)
Okay to renew but emphasize that he has 2 try to reduce the amount that he is taking.

## 2013-08-28 ENCOUNTER — Encounter: Payer: Self-pay | Admitting: Gastroenterology

## 2013-08-29 ENCOUNTER — Other Ambulatory Visit: Payer: Self-pay | Admitting: Gastroenterology

## 2013-08-29 ENCOUNTER — Telehealth: Payer: Self-pay

## 2013-08-29 ENCOUNTER — Other Ambulatory Visit: Payer: Self-pay | Admitting: Internal Medicine

## 2013-08-29 MED ORDER — HYDROCODONE-ACETAMINOPHEN 5-325 MG PO TABS: 1.0000 | ORAL_TABLET | ORAL | Status: DC | PRN

## 2013-08-29 NOTE — Telephone Encounter (Signed)
Message copied by Chrystie Nose on Wed Aug 29, 2013 10:43 AM ------      Message from: Melvia Heaps D      Created: Tue Aug 28, 2013  4:43 PM       Okay to renew pain medicine.  You can prescribe 50 and inform him that this will have to last at least 2 weeks ------

## 2013-08-29 NOTE — Telephone Encounter (Signed)
Previous script for #25 torn up. New script for #50 written and placed up front for pt to pickup.

## 2013-08-29 NOTE — Telephone Encounter (Signed)
Hello [name] .  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

## 2013-08-30 ENCOUNTER — Encounter: Payer: Self-pay | Admitting: Gastroenterology

## 2013-08-30 NOTE — Telephone Encounter (Signed)
Med sent.

## 2013-09-03 ENCOUNTER — Other Ambulatory Visit: Payer: Self-pay

## 2013-09-03 DIAGNOSIS — K5090 Crohn's disease, unspecified, without complications: Secondary | ICD-10-CM

## 2013-09-04 ENCOUNTER — Telehealth: Payer: Self-pay | Admitting: Gastroenterology

## 2013-09-04 NOTE — Telephone Encounter (Signed)
Dr Arlyce Dice, Did you fill out his diability form or have you seen it. I believe I sent him down to Medical records before with those papers

## 2013-09-05 ENCOUNTER — Telehealth: Payer: Self-pay | Admitting: Gastroenterology

## 2013-09-05 ENCOUNTER — Encounter (HOSPITAL_COMMUNITY): Payer: Self-pay

## 2013-09-05 ENCOUNTER — Encounter (HOSPITAL_COMMUNITY)
Admission: RE | Admit: 2013-09-05 | Discharge: 2013-09-05 | Disposition: A | Discharge status: Home / Self Care | Payer: BC Managed Care – PPO | Source: Ambulatory Visit | Attending: Gastroenterology | Admitting: Gastroenterology

## 2013-09-05 VITALS — BP 103/88 | HR 100 | Temp 97.9°F | Resp 16 | Ht 74.0 in | Wt 155.5 lb

## 2013-09-05 DIAGNOSIS — K5090 Crohn's disease, unspecified, without complications: Secondary | ICD-10-CM

## 2013-09-05 MED ORDER — DIPHENHYDRAMINE HCL 25 MG PO CAPS
50.0000 mg | ORAL_CAPSULE | Freq: Once | ORAL | Status: AC
  Administered 2013-09-05: 50 mg via ORAL
  Filled 2013-09-05 (×2): qty 2

## 2013-09-05 MED ORDER — ACETAMINOPHEN 325 MG PO TABS
650.0000 mg | ORAL_TABLET | Freq: Once | ORAL | Status: AC
  Administered 2013-09-05: 650 mg via ORAL
  Filled 2013-09-05: qty 2

## 2013-09-05 MED ORDER — SODIUM CHLORIDE 0.9 % IV SOLN
Freq: Once | INTRAVENOUS | Status: AC
  Administered 2013-09-05: 10:00:00 via INTRAVENOUS

## 2013-09-05 MED ORDER — SODIUM CHLORIDE 0.9 % IV SOLN
5.0000 mg/kg | Freq: Once | INTRAVENOUS | Status: AC
  Administered 2013-09-05: 400 mg via INTRAVENOUS
  Filled 2013-09-05: qty 40

## 2013-09-05 NOTE — Discharge Instructions (Signed)
Infliximab injection °What is this medicine? °INFLIXIMAB (in FLIX i mab) is used to treat Crohn's disease and ulcerative colitis. It is also used to treat ankylosing spondylitis, psoriasis, and some forms of arthritis. °This medicine may be used for other purposes; ask your health care provider or pharmacist if you have questions. °COMMON BRAND NAME(S): Remicade °What should I tell my health care provider before I take this medicine? °They need to know if you have any of these conditions: °-diabetes °-exposure to tuberculosis °-heart failure °-hepatitis or liver disease °-immune system problems °-infection °-lung or breathing disease, like COPD °-multiple sclerosis °-current or past resident of [place] °-seizure disorder °-an unusual or allergic reaction to infliximab, mouse proteins, other medicines, foods, dyes, or preservatives °-pregnant or trying to get pregnant °-breast-feeding °How should I use this medicine? °This medicine is for injection into a vein. It is usually given by a health care professional in a hospital or clinic setting. °A special MedGuide will be given to you by the pharmacist with each prescription and refill. Be sure to read this information carefully each time. °Talk to your pediatrician regarding the use of this medicine in children. Special care may be needed. °Overdosage: If you think you have taken too much of this medicine contact a poison control center or emergency room at once. °NOTE: This medicine is only for you. Do not share this medicine with others. °What if I miss a dose? °It is important not to miss your dose. Call your doctor or health care professional if you are unable to keep an appointment. °What may interact with this medicine? °Do not take this medicine with any of the following medications: °-anakinra °-rilonacept °This medicine may also interact with the following medications: °-vaccines °This list may not describe all possible interactions.  Give your health care provider a list of all the medicines, herbs, non-prescription drugs, or dietary supplements you use. Also tell them if you smoke, drink alcohol, or use illegal drugs. Some items may interact with your medicine. °What should I watch for while using this medicine? °Visit your doctor or health care professional for regular checks on your progress. °If you get a cold or other infection while receiving this medicine, call your doctor or health care professional. Do not treat yourself. This medicine may decrease your body's ability to fight infections. Before beginning therapy, your doctor may do a test to see if you have been exposed to tuberculosis. °This medicine may make the symptoms of heart failure worse in some patients. If you notice symptoms such as increased shortness of breath or swelling of the ankles or legs, contact your health care provider right away. °If you are going to have surgery or dental work, tell your health care professional or dentist that you have received this medicine. °If you take this medicine for plaque psoriasis, stay out of the sun. If you cannot avoid being in the sun, wear protective clothing and use sunscreen. Do not use sun lamps or tanning beds/booths. °What side effects may I notice from receiving this medicine? °Side effects that you should report to your doctor or health care professional as soon as possible: °-allergic reactions like skin rash, itching or hives, swelling of the face, lips, or tongue °-chest pain °-fever or chills, usually related to the infusion °-muscle or joint pain °-red, scaly patches or raised bumps on the skin °-signs of infection - fever or chills, cough, sore throat, pain or difficulty passing urine °-swollen lymph nodes   in the neck, underarm, or groin areas °-unexplained weight loss °-unusual bleeding or bruising °-unusually weak or tired °-yellowing of the eyes or skin °Side effects that usually do not require medical attention  (report to your doctor or health care professional if they continue or are bothersome): °-headache °-heartburn or stomach pain °-nausea, vomiting °This list may not describe all possible side effects. Call your doctor for medical advice about side effects. You may report side effects to FDA at 1-800-FDA-1088. °Where should I keep my medicine? °This drug is given in a hospital or clinic and will not be stored at home. °NOTE: This sheet is a summary. It may not cover all possible information. If you have questions about this medicine, talk to your doctor, pharmacist, or health care provider. °© 2014, Elsevier/[employer]. (2008-03-20 10:26:02) ° °

## 2013-09-05 NOTE — Telephone Encounter (Signed)
I have filled out any forms that have come across my desk

## 2013-09-05 NOTE — Telephone Encounter (Signed)
Spoke with pt that samples are in the fridge to ask for me.  Also I told him to check with Medical records for his disability forms

## 2013-09-05 NOTE — Telephone Encounter (Signed)
Informed patient NORD form found, took up for Dr. Arlyce Dice to document a date on form. Called patient, Mr. [name] indicated he needs another form by today. The form he is looking for was dropped off in November during an office visit and is from [facility]. rmf 1.21.15

## 2013-09-07 ENCOUNTER — Encounter: Payer: Self-pay | Admitting: Gastroenterology

## 2013-09-07 ENCOUNTER — Telehealth: Payer: Self-pay | Admitting: *Deleted

## 2013-09-07 NOTE — Telephone Encounter (Signed)
Spoke with patient and gave him the message.

## 2013-09-07 NOTE — Telephone Encounter (Signed)
Message copied by Daphine DeutscherMILLER, Madisen Ludvigsen N on Fri Sep 07, 2013  4:36 PM ------      Message from: Melvia HeapsKAPLAN, ROBERT D      Created: Fri Sep 07, 2013  4:26 PM       Please inform the patient that I call his attorney's cell phone an office and left a message.   I will try again Monday ------

## 2013-09-10 ENCOUNTER — Encounter: Payer: Self-pay | Admitting: Gastroenterology

## 2013-09-10 ENCOUNTER — Other Ambulatory Visit: Payer: Self-pay | Admitting: Gastroenterology

## 2013-09-10 MED ORDER — HYDROCODONE-ACETAMINOPHEN 5-325 MG PO TABS: 1.0000 | ORAL_TABLET | ORAL | Status: DC | PRN

## 2013-09-10 NOTE — Telephone Encounter (Signed)
09/05/13 Patient annoyed form received in October was sent back to attorney due to incomplete authorization. Patient annoyed we did not call him directly, relayed to him if represented by attorney and request from attorney that is the party we respond to. Patient irate and attempted to recall e-mail from attorney to produce forms. Inappropriate behavior exhibited, security called. rmf  09/06/13-Forms received in medical records and presented to Dr. Arlyce DiceKaplan for completion. Called patient for pick up, patient exhibited irration near staff, security called. After refusal to leave the property [employer] [place] [employer] dispatched. Patient left on his own accord. rmf

## 2013-09-16 ENCOUNTER — Encounter (HOSPITAL_COMMUNITY): Payer: Self-pay | Admitting: Emergency Medicine

## 2013-09-16 ENCOUNTER — Emergency Department (HOSPITAL_COMMUNITY)
Admission: EM | Admit: 2013-09-16 | Discharge: 2013-09-16 | Disposition: A | Discharge status: Home / Self Care | Payer: BC Managed Care – PPO | Attending: Emergency Medicine | Admitting: Emergency Medicine

## 2013-09-16 DIAGNOSIS — IMO0002 Reserved for concepts with insufficient information to code with codable children: Secondary | ICD-10-CM | POA: Insufficient documentation

## 2013-09-16 DIAGNOSIS — T4995XA Adverse effect of unspecified topical agent, initial encounter: Secondary | ICD-10-CM | POA: Insufficient documentation

## 2013-09-16 DIAGNOSIS — Z79899 Other long term (current) drug therapy: Secondary | ICD-10-CM | POA: Insufficient documentation

## 2013-09-16 DIAGNOSIS — F319 Bipolar disorder, unspecified: Secondary | ICD-10-CM | POA: Insufficient documentation

## 2013-09-16 DIAGNOSIS — R220 Localized swelling, mass and lump, head: Secondary | ICD-10-CM | POA: Insufficient documentation

## 2013-09-16 DIAGNOSIS — R000 Tachycardia, unspecified: Secondary | ICD-10-CM | POA: Insufficient documentation

## 2013-09-16 DIAGNOSIS — R221 Localized swelling, mass and lump, neck: Principal | ICD-10-CM

## 2013-09-16 DIAGNOSIS — T7840XA Allergy, unspecified, initial encounter: Secondary | ICD-10-CM

## 2013-09-16 DIAGNOSIS — F909 Attention-deficit hyperactivity disorder, unspecified type: Secondary | ICD-10-CM | POA: Insufficient documentation

## 2013-09-16 DIAGNOSIS — F411 Generalized anxiety disorder: Secondary | ICD-10-CM | POA: Insufficient documentation

## 2013-09-16 DIAGNOSIS — Z8719 Personal history of other diseases of the digestive system: Secondary | ICD-10-CM | POA: Insufficient documentation

## 2013-09-16 MED ORDER — FAMOTIDINE IN NACL 20-0.9 MG/50ML-% IV SOLN
20.0000 mg | Freq: Once | INTRAVENOUS | Status: AC
  Administered 2013-09-16: 20 mg via INTRAVENOUS
  Filled 2013-09-16: qty 50

## 2013-09-16 MED ORDER — EPINEPHRINE 0.3 MG/0.3ML IJ SOAJ
INTRAMUSCULAR | Status: AC
  Administered 2013-09-16: 0.3 mg
  Filled 2013-09-16: qty 0.3

## 2013-09-16 MED ORDER — DIPHENHYDRAMINE HCL 50 MG/ML IJ SOLN
INTRAMUSCULAR | Status: AC
  Administered 2013-09-16: 50 mg
  Filled 2013-09-16: qty 1

## 2013-09-16 MED ORDER — EPINEPHRINE 0.3 MG/0.3ML IJ SOAJ: 0.3000 mg | INTRAMUSCULAR | Status: AC | PRN

## 2013-09-16 MED ORDER — METHYLPREDNISOLONE SODIUM SUCC 125 MG IJ SOLR
INTRAMUSCULAR | Status: AC
Start: 2013-09-16 — End: 2013-09-16
  Administered 2013-09-16: 125 mg
  Filled 2013-09-16: qty 2

## 2013-09-16 NOTE — ED Provider Notes (Signed)
Medical screening examination/treatment/procedure(s) were performed by non-physician practitioner and as supervising physician I was immediately available for consultation/collaboration.  EKG Interpretation    Date/Time:  Sunday September 16 2013 16:08:14 EST Ventricular Rate:  133 PR Interval:  140 QRS Duration: 90 QT Interval:  295 QTC Calculation: 439 R Axis:   79 Text Interpretation:  Sinus tachycardia Confirmed by Alleen Kehm  DO, Jackson Fetters ([id number]) on 09/16/2013 4:21:12 PM              Layla Maw Braxtyn Bojarski, DO 09/16/13 [id number]

## 2013-09-16 NOTE — ED Notes (Addendum)
[place] c/o swollen tongue and allergic reaction. NO new meds and unknown reaction source. PT having difficulties talking presenting muffled speech.

## 2013-09-16 NOTE — Discharge Instructions (Signed)
Allergies °Allergies may happen from anything your body is sensitive to. This may be food, medicines, pollens, chemicals, and nearly anything around you in everyday life that produces allergens. An allergen is anything that causes an allergy producing substance. Heredity is often a factor in causing these problems. This means you may have some of the same allergies as your parents. °Food allergies happen in all age groups. Food allergies are some of the most severe and life threatening. Some common food allergies are cow's milk, seafood, eggs, nuts, wheat, and soybeans. °SYMPTOMS  °· Swelling around the mouth. °· An itchy red rash or hives. °· Vomiting or diarrhea. °· Difficulty breathing. °SEVERE ALLERGIC REACTIONS ARE LIFE-THREATENING. °This reaction is called anaphylaxis. It can cause the mouth and throat to swell and cause difficulty with breathing and swallowing. In severe reactions only a trace amount of food (for example, peanut oil in a salad) may cause death within seconds. °Seasonal allergies occur in all age groups. These are seasonal because they usually occur during the same season every year. They may be a reaction to molds, grass pollens, or tree pollens. Other causes of problems are house dust mite allergens, pet dander, and mold spores. The symptoms often consist of nasal congestion, a runny itchy nose associated with sneezing, and tearing itchy eyes. There is often an associated itching of the mouth and ears. The problems happen when you come in contact with pollens and other allergens. Allergens are the particles in the air that the body reacts to with an allergic reaction. This causes you to release allergic antibodies. Through a chain of events, these eventually cause you to release histamine into the blood stream. Although it is meant to be protective to the body, it is this release that causes your discomfort. This is why you were given anti-histamines to feel better.  If you are unable to  pinpoint the offending allergen, it may be determined by skin or blood testing. Allergies cannot be cured but can be controlled with medicine. °Hay fever is a collection of all or some of the seasonal allergy problems. It may often be treated with simple over-the-counter medicine such as diphenhydramine. Take medicine as directed. Do not drink alcohol or drive while taking this medicine. Check with your caregiver or package insert for child dosages. °If these medicines are not effective, there are many new medicines your caregiver can prescribe. Stronger medicine such as nasal spray, eye drops, and corticosteroids may be used if the first things you try do not work well. Other treatments such as immunotherapy or desensitizing injections can be used if all else fails. Follow up with your caregiver if problems continue. These seasonal allergies are usually not life threatening. They are generally more of a nuisance that can often be handled using medicine. °HOME CARE INSTRUCTIONS  °· If unsure what causes a reaction, keep a diary of foods eaten and symptoms that follow. Avoid foods that cause reactions. °· If hives or rash are present: °· Take medicine as directed. °· You may use an over-the-counter antihistamine (diphenhydramine) for hives and itching as needed. °· Apply cold compresses (cloths) to the skin or take baths in cool water. Avoid hot baths or showers. Heat will make a rash and itching worse. °· If you are severely allergic: °· Following a treatment for a severe reaction, hospitalization is often required for closer follow-up. °· Wear a medic-alert bracelet or necklace stating the allergy. °· You and your family must learn how to give adrenaline or use   use an anaphylaxis kit. °· If you have had a severe reaction, always carry your anaphylaxis kit or EpiPen® with you. Use this medicine as directed by your caregiver if a severe reaction is occurring. Failure to do so could have a fatal outcome. °SEEK MEDICAL  CARE IF: °· You suspect a food allergy. Symptoms generally happen within 30 minutes of eating a food. °· Your symptoms have not gone away within 2 days or are getting worse. °· You develop new symptoms. °· You want to retest yourself or your child with a food or drink you think causes an allergic reaction. Never do this if an anaphylactic reaction to that food or drink has happened before. Only do this under the care of a caregiver. °SEEK IMMEDIATE MEDICAL CARE IF:  °· You have difficulty breathing, are wheezing, or have a tight feeling in your chest or throat. °· You have a swollen mouth, or you have hives, swelling, or itching all over your body. °· You have had a severe reaction that has responded to your anaphylaxis kit or an EpiPen®. These reactions may return when the medicine has worn off. These reactions should be considered life threatening. °MAKE SURE YOU:  °· Understand these instructions. °· Will watch your condition. °· Will get help right away if you are not doing well or get worse. °Document Released: 10/26/2002 Document Revised: 11/27/2012 Document Reviewed: 04/01/2008 °ExitCare® Patient Information ©2014 [employer]. °Anaphylactic Reaction °An anaphylactic reaction is a sudden, severe allergic reaction that involves the whole body. It can be life threatening. A hospital stay is often required. People with asthma, eczema, or hay fever are slightly more likely to have an anaphylactic reaction. °CAUSES  °An anaphylactic reaction may be caused by anything to which you are allergic. After being exposed to the allergic substance, your immune system becomes sensitized to it. When you are exposed to that allergic substance again, an allergic reaction can occur. Common causes of an anaphylactic reaction include: °· Medicines. °· Foods, especially peanuts, wheat, shellfish, milk, and eggs. °· Insect bites or stings. °· Blood products. °· Chemicals, such as dyes, latex, and contrast material used for imaging  tests. °SYMPTOMS  °When an allergic reaction occurs, the body releases histamine and other substances. These substances cause symptoms such as tightening of the airway. Symptoms often develop within seconds or minutes of exposure. Symptoms may include: °· Skin rash or hives. °· Itching. °· Chest tightness. °· Swelling of the eyes, tongue, or lips. °· Trouble breathing or swallowing. °· Lightheadedness or fainting. °· Anxiety or confusion. °· Stomach pains, vomiting, or diarrhea. °· Nasal congestion. °· A fast or irregular heartbeat (palpitations). °DIAGNOSIS  °Diagnosis is based on your history of recent exposure to allergic substances, your symptoms, and a physical exam. Your caregiver may also perform blood or urine tests to confirm the diagnosis. °TREATMENT  °Epinephrine medicine is the main treatment for an anaphylactic reaction. Other medicines that may be used for treatment include antihistamines, steroids, and albuterol. In severe cases, fluids and medicine to support blood pressure may be given through an intravenous line (IV). Even if you improve after treatment, you need to be observed to make sure your condition does not get worse. This may require a stay in the hospital. °HOME CARE INSTRUCTIONS  °· Wear a medical alert bracelet or necklace stating your allergy. °· You and your family must learn how to use an anaphylaxis kit or give an epinephrine injection to temporarily treat an emergency allergic reaction. Always carry   your epinephrine injection or anaphylaxis kit with you. This can be lifesaving if you have a severe reaction. °· Do not drive or perform tasks after treatment until the medicines used to treat your reaction have worn off, or until your caregiver says it is okay. °· If you have hives or a rash: °· Take medicines as directed by your caregiver. °· You may use an over-the-counter antihistamine (diphenhydramine) as needed. °· Apply cold compresses to the skin or take baths in cool water.  Avoid hot baths or showers. °SEEK MEDICAL CARE IF:  °· You develop symptoms of an allergic reaction to a new substance. Symptoms may start right away or minutes later. °· You develop a rash, hives, or itching. °· You develop new symptoms. °SEEK IMMEDIATE MEDICAL CARE IF:  °· You have swelling of the mouth, difficulty breathing, or wheezing. °· You have a tight feeling in your chest or throat. °· You develop hives, swelling, or itching all over your body. °· You develop severe vomiting or diarrhea. °· You feel faint or pass out. °This is an emergency. Use your epinephrine injection or anaphylaxis kit as you have been instructed. Call your local emergency services (911 in [place] Even if you improve after the injection, you need to be examined at a hospital emergency department. °MAKE SURE YOU:  °· Understand these instructions. °· Will watch your condition. °· Will get help right away if you are not doing well or get worse. °Document Released: 08/02/2005 Document Revised: 02/01/2012 Document Reviewed: 11/03/2011 °ExitCare® Patient Information ©2014 [employer]. ° °

## 2013-09-16 NOTE — ED Provider Notes (Signed)
CSN: [id number]     Arrival date & time 09/16/13  1559 History   First MD Initiated Contact with Patient 09/16/13 1604     Chief Complaint  Patient presents with  . Allergic Reaction   (Consider location/radiation/quality/duration/timing/severity/associated sxs/prior Treatment) Patient is a 33 y.o. male presenting with allergic reaction. The history is provided by the patient. No language interpreter was used.  Allergic Reaction Presenting symptoms: no difficulty swallowing and no wheezing   Severity:  Moderate Prior allergic episodes:  No prior episodes Relieved by:  Nothing Worsened by:  Nothing tried Ineffective treatments:  None tried Pt ate at a [employer].   Pt reports after leaving he felt like his tongue was sweling and sticking to the top of his mouth.  Pt reports he could not breath through his mouth.  Past Medical History  Diagnosis Date  . Anxiety   . Ulcerative colitis   . Bipolar 1 disorder   . Insomnia   . ADHD (attention deficit hyperactivity disorder)   . Depression   . Excessive weight loss    Past Surgical History  Procedure Laterality Date  . Colonoscopy w/ biopsies      hx ulcerative colitis  . Anal fissurectomy     Family History  Problem Relation Age of Onset  . Lymphoma Paternal Grandmother   . Leukemia Maternal Grandmother   . Colon cancer Neg Hx    History  Substance Use Topics  . Smoking status: Never Smoker   . Smokeless tobacco: Never Used  . Alcohol Use: No    Review of Systems  HENT: Negative for facial swelling, sore throat and trouble swallowing.   Respiratory: Negative for cough, choking, shortness of breath and wheezing.   All other systems reviewed and are negative.    Allergies  Cephalexin  Home Medications   Current Outpatient Rx  Name  Route  Sig  Dispense  Refill  . ALPRAZolam (XANAX) 1 MG tablet   Oral   Take 2 mg by mouth 3 (three) times daily as needed for anxiety.         Marland Kitchen  amphetamine-dextroamphetamine (ADDERALL) 20 MG tablet   Oral   Take 20 mg by mouth 4 (four) times daily.         Marland Kitchen azaTHIOprine (IMURAN) 50 MG tablet   Oral   Take 100 mg by mouth daily.          . folic acid (FOLVITE) 1 MG tablet   Oral   Take 1 tablet (1 mg total) by mouth daily.   30 tablet   1   . gabapentin (NEURONTIN) 100 MG capsule   Oral   Take 100 mg by mouth 3 (three) times daily. For anxiety         . hyoscyamine (LEVBID) 0.375 MG 12 hr tablet      TAKE 1 TABLET EVERY 12 HOURS AS NEEDED FOR CRAMPS.   60 tablet   3   . lamoTRIgine (LAMICTAL) 200 MG tablet   Oral   Take 1 tablet (200 mg total) by mouth daily. For mood stabilization.         . mesalamine (LIALDA) 1.2 G EC tablet   Oral   Take 4 tablets (4.8 g total) by mouth daily with breakfast.   120 tablet   0   . Multiple Vitamin (MULTIVITAMIN WITH MINERALS) TABS   Oral   Take 1 tablet by mouth daily. As a nutritional supplement.   30 tablet   0   .  predniSONE (DELTASONE) 20 MG tablet   Oral   Take 1 tablet (20 mg total) by mouth 2 (two) times daily.   100 tablet   1   . sodium chloride 0.9 % SOLN with inFLIXimab 100 MG SOLR   Intravenous   Inject 5 mg/kg into the vein every 8 (eight) weeks.          . traZODone (DESYREL) 100 MG tablet   Oral   Take 2 tablets (200 mg total) by mouth at bedtime as needed for sleep.         . ziprasidone (GEODON) 40 MG capsule   Oral   Take 40-80 mg by mouth 2 (two) times daily with a meal. Takes 80mg  in the morning and 40mg  in the evening         . zolpidem (AMBIEN) 10 MG tablet   Oral   Take 10 mg by mouth at bedtime.           BP 118/69  Pulse 101  Resp 16  SpO2 99% Physical Exam  Nursing note and vitals reviewed. Constitutional: He is oriented to person, place, and time. He appears well-developed and well-nourished.  HENT:  Head: Normocephalic and atraumatic.  Right Ear: External ear normal.  Left Ear: External ear normal.   Swelling to tongue,  Pt having difficulty speaking,  Airway is clear behind tongue.    Eyes: Conjunctivae and EOM are normal. Pupils are equal, round, and reactive to light.  Neck: Normal range of motion.  Cardiovascular:  tachycardia  Pulmonary/Chest: Effort normal and breath sounds normal.  Abdominal: Soft. He exhibits no distension.  Musculoskeletal: Normal range of motion.  Neurological: He is alert and oriented to person, place, and time.  Skin: Skin is warm.  Psychiatric:  anxious    ED Course  CRITICAL CARE Performed by: Elson Areas Authorized by: Elson Areas Total critical care time: 35 minutes Critical care time was exclusive of separately billable procedures and treating other patients and teaching time. Critical care was necessary to treat or prevent imminent or life-threatening deterioration of the following conditions: anaphalaxis. Critical care was time spent personally by me on the following activities: development of treatment plan with patient or surrogate, interpretation of cardiac output measurements, evaluation of patient's response to treatment, examination of patient, obtaining history from patient or surrogate, pulse oximetry, re-evaluation of patient's condition and review of old charts. Comments: Pt directly observed by me for 35 minutes due to ananaphylaxis with possiblity of airway compromise and need for possible emergent intubation/airway management.   (including critical care time) Labs Review Labs Reviewed - No data to display Imaging Review No results found.  EKG Interpretation    Date/Time:  Sunday September 16 2013 16:08:14 EST Ventricular Rate:  133 PR Interval:  140 QRS Duration: 90 QT Interval:  295 QTC Calculation: 439 R Axis:   79 Text Interpretation:  Sinus tachycardia Confirmed by WARD  DO, KRISTEN ([id number]) on 09/16/2013 4:21:12 PM          Pt placed on monitor, O2, IV ns x 1 liter,  Pt given epi .3 subq, solumedrol, pepcid  and benadry.  Pt breathing well on 02.  Pt sats 100%.   Pt extremely anxious.   I remained at bedside until decreased swelling of tongue and pt able to answer questions and he feels like things are returning to normal.    Pt observed x 4 hours.   Reexamined multiple times.  No return of swelling.  Pt counseled on allergicc reactions.   Pt educated in using epi pen and given rx.   Pt referred to Dr. Colbert CallasSharma for evaluation      No diagnosis found. EPi Pen, Benadryl, Pepcid    Lonia SkinnerLeslie K HillsboroSofia, [place] JerseyPA-C 09/16/13 1941

## 2013-09-20 ENCOUNTER — Other Ambulatory Visit (INDEPENDENT_AMBULATORY_CARE_PROVIDER_SITE_OTHER): Payer: BC Managed Care – PPO

## 2013-09-20 DIAGNOSIS — K5190 Ulcerative colitis, unspecified, without complications: Secondary | ICD-10-CM

## 2013-09-20 LAB — CBC WITH DIFFERENTIAL/PLATELET
BASOS ABS: 0 10*3/uL (ref 0.0–0.1)
Basophils Relative: 0.6 % (ref 0.0–3.0)
EOS ABS: 0.1 10*3/uL (ref 0.0–0.7)
Eosinophils Relative: 1.8 % (ref 0.0–5.0)
HCT: 42.4 % (ref 39.0–52.0)
Hemoglobin: 14.1 g/dL (ref 13.0–17.0)
Lymphocytes Relative: 45.7 % (ref 12.0–46.0)
Lymphs Abs: 2.6 10*3/uL (ref 0.7–4.0)
MCHC: 33.1 g/dL (ref 30.0–36.0)
MCV: 104 fl — ABNORMAL HIGH (ref 78.0–100.0)
Monocytes Absolute: 0.4 10*3/uL (ref 0.1–1.0)
Monocytes Relative: 7.1 % (ref 3.0–12.0)
NEUTROS ABS: 2.5 10*3/uL (ref 1.4–7.7)
NEUTROS PCT: 44.8 % (ref 43.0–77.0)
Platelets: 259 10*3/uL (ref 150.0–400.0)
RBC: 4.08 Mil/uL — ABNORMAL LOW (ref 4.22–5.81)
RDW: 13.5 % (ref 11.5–14.6)
WBC: 5.6 10*3/uL (ref 4.5–10.5)

## 2013-09-20 LAB — COMPREHENSIVE METABOLIC PANEL
ALT: 18 U/L (ref 0–53)
AST: 18 U/L (ref 0–37)
Albumin: 4.4 g/dL (ref 3.5–5.2)
Alkaline Phosphatase: 52 U/L (ref 39–117)
BILIRUBIN TOTAL: 0.4 mg/dL (ref 0.3–1.2)
BUN: 8 mg/dL (ref 6–23)
CALCIUM: 9.3 mg/dL (ref 8.4–10.5)
CHLORIDE: 104 meq/L (ref 96–112)
CO2: 27 mEq/L (ref 19–32)
CREATININE: 0.6 mg/dL (ref 0.4–1.5)
GFR: 157.61 mL/min (ref >60.00)
Glucose, Bld: 86 mg/dL (ref 70–99)
Potassium: 3.6 mEq/L (ref 3.5–5.1)
Sodium: 139 mEq/L (ref 135–145)
TOTAL PROTEIN: 7.7 g/dL (ref 6.0–8.3)

## 2013-09-24 ENCOUNTER — Telehealth: Payer: Self-pay | Admitting: Gastroenterology

## 2013-09-24 ENCOUNTER — Encounter: Payer: Self-pay | Admitting: Gastroenterology

## 2013-09-24 MED ORDER — HYDROCODONE-ACETAMINOPHEN 5-325 MG PO TABS: 1.0000 | ORAL_TABLET | ORAL | Status: DC | PRN

## 2013-09-24 NOTE — Telephone Encounter (Signed)
OK to refill med per Dr. Arlyce DiceKaplan. There are no samples of VSL#3, pt aware.

## 2013-09-25 ENCOUNTER — Encounter: Payer: Self-pay | Admitting: Gastroenterology

## 2013-09-25 ENCOUNTER — Ambulatory Visit (INDEPENDENT_AMBULATORY_CARE_PROVIDER_SITE_OTHER): Payer: BC Managed Care – PPO | Admitting: Gastroenterology

## 2013-09-25 ENCOUNTER — Other Ambulatory Visit (INDEPENDENT_AMBULATORY_CARE_PROVIDER_SITE_OTHER): Payer: BC Managed Care – PPO

## 2013-09-25 VITALS — BP 80/60 | HR 126 | Ht 74.0 in | Wt 153.2 lb

## 2013-09-25 DIAGNOSIS — K5090 Crohn's disease, unspecified, without complications: Secondary | ICD-10-CM

## 2013-09-25 DIAGNOSIS — K5190 Ulcerative colitis, unspecified, without complications: Secondary | ICD-10-CM

## 2013-09-25 LAB — CBC WITH DIFFERENTIAL/PLATELET
BASOS ABS: 0 10*3/uL (ref 0.0–0.1)
Basophils Relative: 1 % (ref 0.0–3.0)
EOS PCT: 2.1 % (ref 0.0–5.0)
Eosinophils Absolute: 0.1 10*3/uL (ref 0.0–0.7)
HEMATOCRIT: 41.9 % (ref 39.0–52.0)
Hemoglobin: 14.2 g/dL (ref 13.0–17.0)
LYMPHS PCT: 60.8 % — AB (ref 12.0–46.0)
Lymphs Abs: 2.4 10*3/uL (ref 0.7–4.0)
MCHC: 33.8 g/dL (ref 30.0–36.0)
MCV: 101.3 fl — ABNORMAL HIGH (ref 78.0–100.0)
MONOS PCT: 6.9 % (ref 3.0–12.0)
Monocytes Absolute: 0.3 10*3/uL (ref 0.1–1.0)
Neutro Abs: 1.2 10*3/uL — ABNORMAL LOW (ref 1.4–7.7)
Neutrophils Relative %: 29.2 % — ABNORMAL LOW (ref 43.0–77.0)
PLATELETS: 256 10*3/uL (ref 150.0–400.0)
RBC: 4.14 Mil/uL — ABNORMAL LOW (ref 4.22–5.81)
RDW: 13 % (ref 11.5–14.6)
WBC: 4 10*3/uL — AB (ref 4.5–10.5)

## 2013-09-25 LAB — HEPATIC FUNCTION PANEL
ALBUMIN: 4.3 g/dL (ref 3.5–5.2)
ALK PHOS: 36 U/L — AB (ref 39–117)
ALT: 13 U/L (ref 0–53)
AST: 18 U/L (ref 0–37)
Bilirubin, Direct: 0.1 mg/dL (ref 0.0–0.3)
TOTAL PROTEIN: 7.5 g/dL (ref 6.0–8.3)
Total Bilirubin: 0.7 mg/dL (ref 0.3–1.2)

## 2013-09-25 NOTE — Assessment & Plan Note (Signed)
Slow improvement on Remicade, Imuran, lialda and prednisone.  Plan to reduce prednisone to 20 mg daily and reassess in 2 weeks.  I will also check a TPMT enzyme level, follow LFTs and CBC.  Patient had an incident in medical records where he expressed hostile behavior.  He was warned that he will be dismissed from the practice if this behavior recurs.

## 2013-09-25 NOTE — Progress Notes (Signed)
          History of Present Illness:  [name] has returned for followup of ulcerative colitis.  [name] had about 3 Remicade infusions, continues on Imuran and lialda, and prednisone 30 mg daily. He may have for an occasionally more bowel movements a day which are increasingly formed.  There is still some blood in the stool.  Abdominal pain continues.  He is slowly gaining weight.  Altogether he feels improved.    Review of Systems: Pertinent positive and negative review of systems were noted in the above HPI section. All other review of systems were otherwise negative.    Current Medications, Allergies, Past Medical History, Past Surgical History, Family History and Social History were reviewed in [facility] electronic medical record  Vital signs were reviewed in today's medical record. Physical Exam: General: Thin male in no acute distress   See Assessment and Plan under Problem List

## 2013-09-25 NOTE — Patient Instructions (Signed)
Decrease Prednisone to 20mg  daily Go to the basement today for labs Come back on 10/02/2013 for labs Call back in 2 weeks to see how you are doing Follow up in one month Increase Imuran to 125mg  daily

## 2013-10-03 ENCOUNTER — Other Ambulatory Visit: Payer: Self-pay | Admitting: Nurse Practitioner

## 2013-10-03 ENCOUNTER — Encounter: Payer: Self-pay | Admitting: Gastroenterology

## 2013-10-03 ENCOUNTER — Other Ambulatory Visit: Payer: Self-pay | Admitting: Gastroenterology

## 2013-10-05 ENCOUNTER — Encounter: Payer: Self-pay | Admitting: Gastroenterology

## 2013-10-05 ENCOUNTER — Telehealth: Payer: Self-pay | Admitting: Gastroenterology

## 2013-10-05 ENCOUNTER — Encounter: Payer: Self-pay | Admitting: Internal Medicine

## 2013-10-05 MED ORDER — HYDROCODONE-ACETAMINOPHEN 5-325 MG PO TABS: 1.0000 | ORAL_TABLET | ORAL | Status: DC | PRN

## 2013-10-05 NOTE — Telephone Encounter (Signed)
OK to refill per Mike Gip PA. Script up front for pickup, pt aware.

## 2013-10-05 NOTE — Telephone Encounter (Signed)
Pt requests refill on his pain med. Last prescription was filled 2/9 with a quantity of 50. Are you ok to refill this for pt? Please advise.

## 2013-10-08 ENCOUNTER — Encounter: Payer: Self-pay | Admitting: Gastroenterology

## 2013-10-09 ENCOUNTER — Encounter: Payer: Self-pay | Admitting: Gastroenterology

## 2013-10-15 ENCOUNTER — Telehealth: Payer: Self-pay

## 2013-10-15 ENCOUNTER — Encounter: Payer: Self-pay | Admitting: Gastroenterology

## 2013-10-15 MED ORDER — HYDROCODONE-ACETAMINOPHEN 5-325 MG PO TABS: 1.0000 | ORAL_TABLET | ORAL | Status: DC | PRN

## 2013-10-15 NOTE — Telephone Encounter (Signed)
Pt is requesting a refill on his hydrocodone. Last refill was 10/05/13. Please advise.

## 2013-10-15 NOTE — Telephone Encounter (Signed)
Script up front for pick up

## 2013-10-15 NOTE — Telephone Encounter (Signed)
Ok to refill 

## 2013-10-17 ENCOUNTER — Encounter: Payer: Self-pay | Admitting: Gastroenterology

## 2013-10-24 ENCOUNTER — Other Ambulatory Visit: Payer: Self-pay | Admitting: Gastroenterology

## 2013-10-24 ENCOUNTER — Telehealth: Payer: Self-pay | Admitting: Gastroenterology

## 2013-10-24 NOTE — Telephone Encounter (Signed)
Dr Kaplan, Please advise 

## 2013-10-25 MED ORDER — HYDROCODONE-ACETAMINOPHEN 5-325 MG PO TABS: 1.0000 | ORAL_TABLET | ORAL | Status: DC | PRN

## 2013-10-25 NOTE — Telephone Encounter (Signed)
Printed med called  pt to pick up

## 2013-10-25 NOTE — Telephone Encounter (Signed)
Instruct patient that he has to reduce his pain medicine intake.  He can have Vicodin one tab every 6 hours as needed and give him 30 with no renewals

## 2013-10-27 ENCOUNTER — Encounter: Payer: Self-pay | Admitting: Gastroenterology

## 2013-10-28 ENCOUNTER — Other Ambulatory Visit: Payer: Self-pay | Admitting: Gastroenterology

## 2013-10-30 ENCOUNTER — Encounter: Payer: Self-pay | Admitting: Gastroenterology

## 2013-10-30 ENCOUNTER — Telehealth: Payer: Self-pay | Admitting: Gastroenterology

## 2013-10-31 ENCOUNTER — Telehealth: Payer: Self-pay

## 2013-10-31 ENCOUNTER — Encounter (HOSPITAL_COMMUNITY): Admission: RE | Admit: 2013-10-31 | Payer: BC Managed Care – PPO | Source: Ambulatory Visit

## 2013-10-31 NOTE — Telephone Encounter (Signed)
Pt did not show for his Remicade today per short stay. Called pt and he states he was having some IBD problems so he did not go. Pt given the phone number [phone] to call and reschedule the appt.

## 2013-10-31 NOTE — Telephone Encounter (Signed)
Dr Arlyce DiceKaplan, We just gave him 30 pills on Friday the 13th... He is out again.    I think you should discharge

## 2013-10-31 NOTE — Telephone Encounter (Signed)
Inform him that he has to start weaning from pain medications.  I would not renew until March 20 at which point I would give him 25.  Also instructed not to get pain medicines from anyone else.

## 2013-11-02 MED ORDER — HYDROCODONE-ACETAMINOPHEN 5-325 MG PO TABS: 1.0000 | ORAL_TABLET | ORAL | Status: DC | PRN

## 2013-11-02 NOTE — Telephone Encounter (Signed)
Picked up printed rx signed by Dr Arlyce DiceKaplan today  Per Dr Arlyce DiceKaplan No MORE REFILLS [place] aware

## 2013-11-06 ENCOUNTER — Telehealth: Payer: Self-pay | Admitting: Gastroenterology

## 2013-11-06 MED ORDER — HYDROCODONE-ACETAMINOPHEN 5-325 MG PO TABS: 1.0000 | ORAL_TABLET | Freq: Four times a day (QID) | ORAL | Status: DC | PRN

## 2013-11-06 NOTE — Telephone Encounter (Signed)
Zella Ball explained to me that Dr. Arlyce Dice wanted him not to have any more refills from this office.  I am not comfortable contradicting this decision.

## 2013-11-06 NOTE — Telephone Encounter (Signed)
Discussed with patient that Dr Arlyce DiceKaplan is out of town this week  Explained to him that we have to get with doc of the day (Dr Christella HartiganJacobs) To see how many we are giving him as far as the quanitiy and the script will be changed to 1 tab every 6 hours instead of 4 hours Patient understood that once the script is ready I would call him to pick up. Would probably be tomorrow   Dr Christella HartiganJacobs, Patient is needing a refill of Hydrocodone Dr Arlyce DiceKaplan is trying to wein him off of them. Last script he picked up was on the 20th We gave him 25. What quanitiy do u want me to send him now. For him to take every 6 hours per Dr Arlyce DiceKaplan instead of every 4.. With no refills  Thank You

## 2013-11-06 NOTE — Addendum Note (Signed)
Addended by: Selinda Michaels R on: 11/06/2013 03:00 PM   Modules accepted: Orders

## 2013-11-06 NOTE — Telephone Encounter (Signed)
Per Dr Arlyce Dice, Just spoke with him (Dr Arlyce Dice). To renew at the end of the week. Only 28 tabs a week has to last him till Friday, then 21 tabs for the next 7 days.  [name] Esterwood tapering before message came back to me from Dr Arlyce Dice. Selinda Michaels also handleing the same message. Bonita Quin to print out script and contact the patient  See Other phone note

## 2013-11-06 NOTE — Telephone Encounter (Signed)
Reviewed pts record with Mike GipAmy Esterwood PA. Dr. Arlyce DiceKaplan has been trying to get the pt to wean off of pain medications. Pts script refilled with specific instructions. Pt may take 1 pill every 6 hours #24, no more than 4 pills per day. Pt will then receive a script to take 1 pill every 8 hours #21 with no more than 3 pills per day. Then pt will get a script stating 1 pill every 12 hours #14 no more than 2 pills per day.

## 2013-11-12 ENCOUNTER — Telehealth: Payer: Self-pay | Admitting: Gastroenterology

## 2013-11-12 NOTE — Telephone Encounter (Signed)
Renew according to [name]'s schedule

## 2013-11-12 NOTE — Telephone Encounter (Signed)
Dr Arlyce Dice, See Amys taper,    Is it time for another reill??

## 2013-11-13 ENCOUNTER — Telehealth: Payer: Self-pay | Admitting: Gastroenterology

## 2013-11-13 MED ORDER — HYDROCODONE-ACETAMINOPHEN 5-325 MG PO TABS: ORAL_TABLET | ORAL | Status: DC

## 2013-11-13 NOTE — Telephone Encounter (Signed)
[name], Help me out here, What is the next script he is suppose to get? Im confused.Marland Kitchen.Marland Kitchen..Marland Kitchen

## 2013-11-13 NOTE — Telephone Encounter (Signed)
Printed new rx for 1 pill every 8 hours no more than 3 per day dispense 21   Called pt to come and pick up script

## 2013-11-13 NOTE — Telephone Encounter (Signed)
Pt was actually due for refill yesterday. Pt may have new script, see instructions in telephone note dated 11/06/13.

## 2013-11-13 NOTE — Telephone Encounter (Signed)
See other phone note

## 2013-11-20 ENCOUNTER — Other Ambulatory Visit: Payer: Self-pay | Admitting: Gastroenterology

## 2013-11-20 ENCOUNTER — Telehealth: Payer: Self-pay | Admitting: Gastroenterology

## 2013-11-20 NOTE — Telephone Encounter (Signed)
Left a message for patient to call me. 

## 2013-11-20 NOTE — Telephone Encounter (Signed)
Patient states he thinks he is flaring. He is having 6-7 bloody stools/day. He is asking if he needs to get back on Prednisone. He is also asking for pain medication. He has been on a Vicodin taper and was to be down to 1 pill every 12 hours with the rx today. Do you want to do this or something else. Please, advise.

## 2013-11-21 MED ORDER — HYDROCODONE-ACETAMINOPHEN 5-325 MG PO TABS: ORAL_TABLET | ORAL | Status: DC

## 2013-11-21 MED ORDER — PREDNISONE 20 MG PO TABS: ORAL_TABLET | ORAL | Status: DC

## 2013-11-21 NOTE — Telephone Encounter (Signed)
Begin prednisone 40 mg daily.  He should have an office visit next week. Continue Vicodin at current dose

## 2013-11-21 NOTE — Telephone Encounter (Signed)
Patient notified of recommendations. Rx up front for pick up. Rx for Prednisone to pharmacy. Scheduled OV on 11/26/13 at 2:30 PM.

## 2013-11-26 ENCOUNTER — Ambulatory Visit (INDEPENDENT_AMBULATORY_CARE_PROVIDER_SITE_OTHER): Payer: BC Managed Care – PPO | Admitting: Gastroenterology

## 2013-11-26 ENCOUNTER — Encounter: Payer: Self-pay | Admitting: Gastroenterology

## 2013-11-26 VITALS — BP 88/60 | HR 128 | Ht 74.0 in | Wt 167.4 lb

## 2013-11-26 DIAGNOSIS — K5190 Ulcerative colitis, unspecified, without complications: Secondary | ICD-10-CM

## 2013-11-26 NOTE — Patient Instructions (Signed)
We will refer you to [place] to see Clarene Essex Follow up in 3-4 weeks  Call in 4 days to report progress

## 2013-11-26 NOTE — Progress Notes (Signed)
          History of Present Illness:  [name] is returned for followup of ulcerative colitis.  He was doing fairly well until the past week when he developed worsening diarrhea with bleeding.  He was taking prednisone 10 mg a day dose instructed to increase it to 40 mg daily.  With the high-dose prednisone symptoms are beginning to improve.  He continues on lialda and Imuran.  He is due for a Remicade infusion at the end of the week.  Continues to complain of abdominal pain.  We have been weaning him from narcotics.  He is taking Ultram.  [name] gained 14 pounds in the past 2 months.    Review of Systems: Pertinent positive and negative review of systems were noted in the above HPI section. All other review of systems were otherwise negative.    Current Medications, Allergies, Past Medical History, Past Surgical History, Family History and Social History [facility]  Vital signs were reviewed in today's medical record. Physical Exam: General: Chronically ill-appearing Skin: anicteric   See Assessment and Plan under Problem List

## 2013-11-26 NOTE — Assessment & Plan Note (Signed)
Unfortunately [name] seems to be flaring as his prednisone was tapered to 10 mg.  [name] responded to a 40 mg dose.  He has now had about 5 or 6 Remicade infusions.  He has had disease since 2001 but clearly it is more active despite step up therapy.  I began the conversation of consideration for a total colectomy.  May also consider switching Biologics.  I will [facility] with Dr. Cedric FishmanKim [name].  In the interim he will continue his current medications including prednisone 40 mg a day.  He was instructed to call back at the end of the week to see whether we can start tapering prednisone again.

## 2013-11-28 ENCOUNTER — Other Ambulatory Visit: Payer: Self-pay

## 2013-11-28 DIAGNOSIS — K5190 Ulcerative colitis, unspecified, without complications: Secondary | ICD-10-CM

## 2013-11-30 ENCOUNTER — Other Ambulatory Visit (HOSPITAL_COMMUNITY): Payer: Self-pay | Admitting: Gastroenterology

## 2013-11-30 ENCOUNTER — Telehealth: Payer: Self-pay | Admitting: Gastroenterology

## 2013-11-30 ENCOUNTER — Encounter (HOSPITAL_COMMUNITY)
Admission: RE | Admit: 2013-11-30 | Discharge: 2013-11-30 | Disposition: A | Discharge status: Home / Self Care | Payer: BC Managed Care – PPO | Source: Ambulatory Visit | Attending: Gastroenterology | Admitting: Gastroenterology

## 2013-11-30 ENCOUNTER — Encounter (HOSPITAL_COMMUNITY): Payer: Self-pay

## 2013-11-30 VITALS — BP 121/82 | HR 116 | Temp 98.1°F | Resp 16 | Ht 74.0 in | Wt 164.5 lb

## 2013-11-30 DIAGNOSIS — K5190 Ulcerative colitis, unspecified, without complications: Secondary | ICD-10-CM

## 2013-11-30 MED ORDER — SODIUM CHLORIDE 0.9 % IV SOLN
400.0000 mg | Freq: Once | INTRAVENOUS | Status: AC
  Administered 2013-11-30: 400 mg via INTRAVENOUS
  Filled 2013-11-30: qty 40

## 2013-11-30 MED ORDER — ACETAMINOPHEN 325 MG PO TABS
650.0000 mg | ORAL_TABLET | Freq: Once | ORAL | Status: AC
  Administered 2013-11-30: 650 mg via ORAL
  Filled 2013-11-30: qty 2

## 2013-11-30 MED ORDER — DIPHENHYDRAMINE HCL 25 MG PO CAPS
50.0000 mg | ORAL_CAPSULE | Freq: Once | ORAL | Status: AC
  Administered 2013-11-30: 50 mg via ORAL
  Filled 2013-11-30: qty 2

## 2013-11-30 MED ORDER — SODIUM CHLORIDE 0.9 % IV SOLN
250.0000 mL | Freq: Once | INTRAVENOUS | Status: AC
  Administered 2013-11-30: 250 mL via INTRAVENOUS

## 2013-11-30 MED ORDER — INFLIXIMAB 100 MG IV SOLR
5.0000 mg/kg | Freq: Once | INTRAVENOUS | Status: DC
  Filled 2013-11-30: qty 40

## 2013-11-30 NOTE — Telephone Encounter (Signed)
VSL 3# 4 packets a day is what he has been taking   Capsule no double strength from Patient assistant program  He is coming in to pick up VSL samples from fridge

## 2013-12-01 ENCOUNTER — Encounter: Payer: Self-pay | Admitting: Gastroenterology

## 2013-12-03 ENCOUNTER — Telehealth: Payer: Self-pay | Admitting: Gastroenterology

## 2013-12-03 MED ORDER — HYDROCODONE-ACETAMINOPHEN 5-325 MG PO TABS: ORAL_TABLET | ORAL | Status: DC

## 2013-12-03 NOTE — Telephone Encounter (Signed)
Dr Kaplan, Please advise 

## 2013-12-03 NOTE — Telephone Encounter (Signed)
[place] prednisone 40 mg a day.  Okay to refill pain medication

## 2013-12-03 NOTE — Telephone Encounter (Signed)
Script printed for last taper  Called pt to inform med ready

## 2013-12-05 ENCOUNTER — Telehealth: Payer: Self-pay | Admitting: Gastroenterology

## 2013-12-05 NOTE — Telephone Encounter (Signed)
L/M for [name] from [facility] to return my call

## 2013-12-06 ENCOUNTER — Encounter: Payer: Self-pay | Admitting: Gastroenterology

## 2013-12-07 NOTE — Telephone Encounter (Signed)
Called patient to inform that message was left for them to send capsules for patient   Patient aware call was made

## 2013-12-07 NOTE — Telephone Encounter (Signed)
Left 2 messages for [facility] [place] to send patient the capsules VSL  Will call patient to inform

## 2013-12-10 ENCOUNTER — Telehealth: Payer: Self-pay | Admitting: Gastroenterology

## 2013-12-10 NOTE — Telephone Encounter (Signed)
Patient picked up last script for Hydrocodone for 1 every 12 hours per [name] Esterwoods Taper. As I understand it that was suppose to be his last script. He is now wanting another script for one every 12 hours.

## 2013-12-11 MED ORDER — HYDROCODONE-ACETAMINOPHEN 5-325 MG PO TABS: ORAL_TABLET | ORAL | Status: DC

## 2013-12-11 NOTE — Telephone Encounter (Signed)
Script out front

## 2013-12-11 NOTE — Telephone Encounter (Signed)
We will continue to taper.  He may have 10 tabs and will not be eligible to refill for 14 days.

## 2013-12-26 ENCOUNTER — Telehealth: Payer: Self-pay | Admitting: Gastroenterology

## 2013-12-27 MED ORDER — HYDROCODONE-ACETAMINOPHEN 5-325 MG PO TABS: ORAL_TABLET | ORAL | Status: DC

## 2013-12-27 NOTE — Telephone Encounter (Signed)
Dr Arlyce Dice, he wants a refill on hydrocodone... We was suppose to taper him off which it suppose to be complete now  What do you want to do??

## 2013-12-27 NOTE — Telephone Encounter (Signed)
Yes yes to continue weaning himself.  He can have 10 tablets that will not be renewed for one month

## 2013-12-27 NOTE — Telephone Encounter (Signed)
Patient to pick up script signed by Dr Arlyce Dice tomorrow

## 2014-01-02 ENCOUNTER — Telehealth: Payer: Self-pay | Admitting: Gastroenterology

## 2014-01-02 NOTE — Telephone Encounter (Signed)
Benjamin Ball do you know about this appt?

## 2014-01-02 NOTE — Telephone Encounter (Signed)
Contacted patient that we have samples will pick up on Friday  Working on Referral to [facility] Patient aware

## 2014-01-03 NOTE — Telephone Encounter (Signed)
Records faxed today

## 2014-01-03 NOTE — Telephone Encounter (Signed)
Called [facility] at [phone]  They stated to fax all records to [phone]

## 2014-01-14 NOTE — Telephone Encounter (Signed)
Called to patient to inform him his samples of VL had to be thrown out. Refrigerator left open He still has not heard from [facility] I will call them today

## 2014-01-16 ENCOUNTER — Telehealth: Payer: Self-pay | Admitting: *Deleted

## 2014-01-16 MED ORDER — HYDROCODONE-ACETAMINOPHEN 5-325 MG PO TABS: ORAL_TABLET | ORAL | Status: DC

## 2014-01-16 NOTE — Telephone Encounter (Signed)
Patient to come get VSL samples today

## 2014-01-16 NOTE — Telephone Encounter (Signed)
Patient wants rx for pain med Dr Arlyce Dice

## 2014-01-16 NOTE — Telephone Encounter (Signed)
Okay to give 10 tabs.  No refills for one month

## 2014-01-16 NOTE — Telephone Encounter (Signed)
Med printed pt to pick up

## 2014-01-21 ENCOUNTER — Other Ambulatory Visit (HOSPITAL_COMMUNITY): Payer: Self-pay | Admitting: Gastroenterology

## 2014-01-25 ENCOUNTER — Encounter (HOSPITAL_COMMUNITY): Payer: Self-pay

## 2014-01-25 ENCOUNTER — Encounter (HOSPITAL_COMMUNITY)
Admission: RE | Admit: 2014-01-25 | Discharge: 2014-01-25 | Disposition: A | Discharge status: Home / Self Care | Payer: BC Managed Care – PPO | Source: Ambulatory Visit | Attending: Gastroenterology | Admitting: Gastroenterology

## 2014-01-25 VITALS — BP 123/72 | HR 121 | Temp 98.1°F | Resp 16 | Ht 74.0 in | Wt 184.0 lb

## 2014-01-25 DIAGNOSIS — K5190 Ulcerative colitis, unspecified, without complications: Secondary | ICD-10-CM

## 2014-01-25 MED ORDER — DIPHENHYDRAMINE HCL 25 MG PO CAPS
50.0000 mg | ORAL_CAPSULE | Freq: Once | ORAL | Status: AC
  Administered 2014-01-25: 50 mg via ORAL
  Filled 2014-01-25: qty 2

## 2014-01-25 MED ORDER — SODIUM CHLORIDE 0.9 % IV SOLN
250.0000 mL | Freq: Once | INTRAVENOUS | Status: AC
  Administered 2014-01-25: 250 mL via INTRAVENOUS

## 2014-01-25 MED ORDER — ACETAMINOPHEN 325 MG PO TABS
650.0000 mg | ORAL_TABLET | Freq: Once | ORAL | Status: AC
  Administered 2014-01-25: 650 mg via ORAL
  Filled 2014-01-25: qty 2

## 2014-01-25 MED ORDER — SODIUM CHLORIDE 0.9 % IV SOLN
400.0000 mg | Freq: Once | INTRAVENOUS | Status: AC
  Administered 2014-01-25: 400 mg via INTRAVENOUS
  Filled 2014-01-25: qty 40

## 2014-01-29 NOTE — Telephone Encounter (Signed)
CALLED [facility] REFAXED INFO AGAIN

## 2014-01-29 NOTE — Telephone Encounter (Signed)
PATIENT HAS STILL NOT HEARD FROM [facility] YET CALLING THEM TODAY

## 2014-01-30 ENCOUNTER — Telehealth: Payer: Self-pay | Admitting: Gastroenterology

## 2014-02-01 NOTE — Telephone Encounter (Signed)
L/M for pt that we no longer have samples of VSL

## 2014-02-08 ENCOUNTER — Telehealth: Payer: Self-pay | Admitting: Gastroenterology

## 2014-02-08 NOTE — Telephone Encounter (Signed)
CALLED [facility]- THEY STATED THEY HAVE ATTEMPTED 3 TIIMES TO CONTACT PATIENT AND HIS VOICE MAILBOX IS FULL  CALLED PT TO INFORM. HE SAID HE DID NOT REALIZE HIS MAILBOX IS FULL PATIENT TO CONTACT THEM FOR HIS APPOINTEMMTN

## 2014-02-14 ENCOUNTER — Telehealth: Payer: Self-pay | Admitting: Gastroenterology

## 2014-02-14 NOTE — Telephone Encounter (Signed)
Pt calling requesting refill on his hydrocodone script. Last filled 01/16/14. [name] Esterwood PA as PA of the day please advise.

## 2014-02-18 ENCOUNTER — Telehealth: Payer: Self-pay | Admitting: Gastroenterology

## 2014-02-18 MED ORDER — HYDROCODONE-ACETAMINOPHEN 5-325 MG PO TABS: ORAL_TABLET | ORAL | Status: DC

## 2014-02-18 NOTE — Telephone Encounter (Signed)
Script up front for pick up

## 2014-02-18 NOTE — Telephone Encounter (Signed)
Declined by me, may forward to Dr. Arlyce Dice

## 2014-02-18 NOTE — Telephone Encounter (Signed)
Okay to take one every 6 hours as needed, #25

## 2014-02-18 NOTE — Telephone Encounter (Signed)
Okay to renew

## 2014-02-18 NOTE — Telephone Encounter (Signed)
Pt states he is having a flare. States it started about 5 days ago. He is having bloody diarrhea and cramping. States he is taking prednisone 10mg  daily right now. Pt wants to know if he should increase his prednisone and would like pain medication for the cramping. Please advise.

## 2014-02-18 NOTE — Telephone Encounter (Signed)
Increase prednisone to 40 mg daily We just renewed his medication today. He needs an office visit next week.  Have an extender see him but I need to see him behind the extender. Please inquire whether he has been seen at [facility] yet for second opinion

## 2014-02-18 NOTE — Telephone Encounter (Signed)
Spoke with [name] and he knows to increase prednisone to 40mg  daily. [name] scheduled to see [name] Esterwood 02/26/14@2pm . [name] aware of appt. [name] is to be [facility] 04[facility]16[facility]. [name] wants to know if Dr. Arlyce Dice will increase the number of pain pills and change the frequency that he can take them due to the flare. Please advise.

## 2014-02-22 ENCOUNTER — Other Ambulatory Visit (HOSPITAL_COMMUNITY): Payer: Self-pay | Admitting: Gastroenterology

## 2014-02-22 NOTE — Telephone Encounter (Signed)
Okay to refill on Monday.  He can have 28 tablets which will have to last 2 weeks

## 2014-02-22 NOTE — Telephone Encounter (Signed)
Please advise can he have another refill. rvd 25 on 7/6

## 2014-02-22 NOTE — Telephone Encounter (Signed)
Will be out of his medication on Monday and wants to make sure he gets his refill

## 2014-02-22 NOTE — Telephone Encounter (Signed)
Called pt to inform he could come in on Monday to pick up new script

## 2014-02-25 ENCOUNTER — Telehealth: Payer: Self-pay | Admitting: Gastroenterology

## 2014-02-25 MED ORDER — HYDROCODONE-ACETAMINOPHEN 5-325 MG PO TABS: ORAL_TABLET | ORAL | Status: DC

## 2014-02-25 NOTE — Telephone Encounter (Signed)
Med ok'd by Dr Arlyce DiceKaplan in another phone note

## 2014-02-26 ENCOUNTER — Encounter: Payer: Self-pay | Admitting: Physician Assistant

## 2014-02-26 ENCOUNTER — Other Ambulatory Visit (INDEPENDENT_AMBULATORY_CARE_PROVIDER_SITE_OTHER): Payer: BC Managed Care – PPO

## 2014-02-26 ENCOUNTER — Ambulatory Visit (INDEPENDENT_AMBULATORY_CARE_PROVIDER_SITE_OTHER): Payer: BC Managed Care – PPO | Admitting: Physician Assistant

## 2014-02-26 VITALS — BP 124/62 | HR 120 | Ht 74.0 in | Wt 187.0 lb

## 2014-02-26 DIAGNOSIS — K51011 Ulcerative (chronic) pancolitis with rectal bleeding: Secondary | ICD-10-CM

## 2014-02-26 DIAGNOSIS — K5100 Ulcerative (chronic) pancolitis without complications: Secondary | ICD-10-CM

## 2014-02-26 DIAGNOSIS — K5190 Ulcerative colitis, unspecified, without complications: Secondary | ICD-10-CM

## 2014-02-26 LAB — CBC
HEMATOCRIT: 41.6 % (ref 39.0–52.0)
HEMOGLOBIN: 13.9 g/dL (ref 13.0–17.0)
MCHC: 33.4 g/dL (ref 30.0–36.0)
MCV: 99.9 fl (ref 78.0–100.0)
Platelets: 276 10*3/uL (ref 150.0–400.0)
RBC: 4.16 Mil/uL — ABNORMAL LOW (ref 4.22–5.81)
RDW: 14.9 % (ref 11.5–15.5)
WBC: 4.7 10*3/uL (ref 4.0–10.5)

## 2014-02-26 LAB — SEDIMENTATION RATE: SED RATE: 8 mm/h (ref 0–22)

## 2014-02-26 MED ORDER — PREDNISONE 20 MG PO TABS: ORAL_TABLET | ORAL | Status: AC

## 2014-02-26 MED ORDER — HYDROCODONE-ACETAMINOPHEN 5-325 MG PO TABS: ORAL_TABLET | ORAL | Status: DC

## 2014-02-26 NOTE — Progress Notes (Signed)
Subjective:    Patient ID: [name], male    DOB: [birth year], 33 y.o.   MRN: [id number]  HPI  [name] is a 33 year old white male well known to Dr. Deatra Ina with history of universal ulcerative colitis. He has been having a difficult time and at this point is unable to come off prednisone. He had been trying to wean over the past month or so but then had an exacerbation 2 weeks ago and is now back up to  40 mg of prednisone daily. He is feeling a bit better but still complains of abdominal cramping and urgency and is having 8-10 bowel movements per day. He sees small amounts of blood with most bowel movements. He has not been having any fevers or chills no nausea or vomiting. He says he has been fatigued for months and then it that is no better. When he has a severe exacerbation he'll have up to 20 bowel movements per day. Patient was started on Remicade earlier this year and had his last injection mid-June. He cannot tell me that he necessarily feels any better after a Remicade infusion. He is also maintained on azathioprine at 150 mg by mouth daily and remains only although 4.8 g daily. Surgical management has been discussed with patient and he is awaiting an appointment for second opinion with Dr. Alisia Ferrari at [facility] in early September. He would like to proceed with surgery at this time as he says he just never feels good. He has been taking hydrocodone on a regular basis and says that he has ongoing pain associated with his colitis. He doesn't sleep well. He is currently on disability.    Review of Systems  Constitutional: Positive for appetite change, fatigue and unexpected weight change.  HENT: Negative.   Eyes: Negative.   Cardiovascular: Negative.   Gastrointestinal: Positive for abdominal pain, diarrhea and blood in stool.  Endocrine: Negative.   Genitourinary: Negative.   Musculoskeletal: Negative.   Allergic/Immunologic: Negative.   Neurological: Negative.   Hematological:  Negative.   Psychiatric/Behavioral: Positive for sleep disturbance and dysphoric mood.   Outpatient Prescriptions Prior to Visit  Medication Sig Dispense Refill  . ALPRAZolam (XANAX) 1 MG tablet Take 2 mg by mouth 3 (three) times daily as needed for anxiety.      Marland Kitchen amphetamine-dextroamphetamine (ADDERALL) 20 MG tablet Take 20 mg by mouth 4 (four) times daily.      Marland Kitchen azaTHIOprine (IMURAN) 50 MG tablet TAKE (3) TABLETS DAILY.  90 tablet  3  . EPINEPHrine (EPIPEN) 0.3 mg/0.3 mL SOAJ injection Inject 0.3 mLs (0.3 mg total) into the muscle as needed.  1 Device  1  . folic acid (FOLVITE) 1 MG tablet TAKE 1 TABLET EACH DAY.  30 tablet  3  . gabapentin (NEURONTIN) 100 MG capsule Take 100 mg by mouth 3 (three) times daily. For anxiety      . hyoscyamine (LEVBID) 0.375 MG 12 hr tablet TAKE 1 TABLET EVERY 12 HOURS AS NEEDED FOR CRAMPS.  60 tablet  3  . lamoTRIgine (LAMICTAL) 200 MG tablet Take 1 tablet (200 mg total) by mouth daily. For mood stabilization.      Marland Kitchen LIALDA 1.2 G EC tablet TAKE 4 TABLETS IN THE MORNING WITH BREAKFAST.  120 tablet  3  . Multiple Vitamin (MULTIVITAMIN WITH MINERALS) TABS Take 1 tablet by mouth daily. As a nutritional supplement.  30 tablet  0  . promethazine (PHENERGAN) 25 MG tablet TAKE 1 TABLET EVERY 6 HOURS AS NEEDED  FOR NAUSEA & VOMITING.  30 tablet  1  . sodium chloride 0.9 % SOLN with inFLIXimab 100 MG SOLR Inject 5 mg/kg into the vein every 8 (eight) weeks.       . traZODone (DESYREL) 100 MG tablet Take 2 tablets (200 mg total) by mouth at bedtime as needed for sleep.      . ziprasidone (GEODON) 40 MG capsule Take 40-80 mg by mouth 2 (two) times daily with a meal. Takes 10m in the morning and 443min the evening      . zolpidem (AMBIEN) 10 MG tablet Take 10 mg by mouth at bedtime.       . Marland KitchenYDROcodone-acetaminophen (NORCO/VICODIN) 5-325 MG per tablet Take 1 pill every 6 hours as needed for pain but no more than 4 per day.  25 tablet  0  . predniSONE (DELTASONE) 20 MG  tablet TAKE 2 TABLETS DAILY.  60 tablet  1  . Probiotic Product (VSL#3 PO) Take by mouth. 4 packets a day       No facility-administered medications prior to visit.   Allergies  Allergen Reactions  . Cephalexin     REACTION: urticaria (hives)      Patient Active Problem List   Diagnosis Date Noted  . Feeding difficulties and mismanagement 07/20/2013  . Immunocompromised 07/20/2013  . Protein-calorie malnutrition, severe 07/09/2013  . EKG, abnormal 05/14/2012  . Bipolar II disorder 05/12/2012  . Attention deficit hyperactivity disorder (ADHD) 05/12/2012  . INSOMNIA-SLEEP DISORDER-UNSPEC 05/06/2010  . HYPERCHOLESTEROLEMIA, PURE 06/14/2008  . ANXIETY 11/27/2007  . DEPRESSION 11/27/2007  . COLITIS, ULCERATIVE 04/26/2001   History  Substance Use Topics  . Smoking status: Never Smoker   . Smokeless tobacco: Never Used  . Alcohol Use: No   family history includes Leukemia in his maternal grandmother; Lymphoma in his paternal grandmother. There is no history of Colon cancer.  Objective:   Physical Exam well-developed somewhat chronically ill appearing anxious white male in no acute distress blood pressure 124/62 pulse 120 height 6 foot 2 weight 187. HEENT ; and in in in; and will and and and and and and and and catheter was a half more; nontraumatic normocephalic EOMI PERRLA sclera anicteric, Supple no JVD, Cardiovascular; regular rate and rhythm with S1-S2 no murmur or gallop, Pulmonary; clear bilaterally, Abdomen soft nondistended there is mild rather generalized tenderness no guarding no rebound no palpable mass or hepatomegaly, Rectal; exam not done, Extremities; no clubbing cyanosis or edema skin warm and dry, Psych; anxious but otherwise appropriate      Assessment & Plan  #1 5#1 [id number] old male with universal ulcerative colitis severe despite management with immunomodulators and biologic and at this point appears to be steroid-dependent. Even with aggressive medical  management we are unable to get his symptoms under control and he continues to feel poorly and requires chronic narcotics. Feel he is a good surgical candidate for total colectomy. Will continue prednisone at 40 mg daily x2 more weeks then decrease to 30 mg daily Continue Remicade every 8 weeks Continue azathioprine 150 mg daily Check CBC and see met today Continue daily probiotic is unable to afford VSL3 and will use Align for now [facility] and [facility] go [facility] Refill hydrocodone 5/325 one every 6 hours as needed for pain #40 no refills-date 02/28/2014

## 2014-02-26 NOTE — Patient Instructions (Signed)
Please go to the basement level to have your labs drawn.   New prescription for Prednisone was sent to your pharmacy. Please take 40 mg for two weeks and then decrease to 30 mg one tablet once daily until follow up visit  You are scheduled for a follow up visit with Mike Gip, PA-C on 03-28-2014 at 830 am

## 2014-02-27 LAB — COMPREHENSIVE METABOLIC PANEL
ALT: 15 U/L (ref 0–53)
AST: 14 U/L (ref 0–37)
Albumin: 4.3 g/dL (ref 3.5–5.2)
Alkaline Phosphatase: 38 U/L — ABNORMAL LOW (ref 39–117)
BILIRUBIN TOTAL: 0.3 mg/dL (ref 0.2–1.2)
BUN: 11 mg/dL (ref 6–23)
CO2: 29 mEq/L (ref 19–32)
CREATININE: 0.7 mg/dL (ref 0.4–1.5)
Calcium: 8.8 mg/dL (ref 8.4–10.5)
Chloride: 107 mEq/L (ref 96–112)
GFR: 141.31 mL/min (ref >60.00)
Glucose, Bld: 134 mg/dL — ABNORMAL HIGH (ref 70–99)
Potassium: 4.1 mEq/L (ref 3.5–5.1)
Sodium: 139 mEq/L (ref 135–145)
Total Protein: 7.3 g/dL (ref 6.0–8.3)

## 2014-02-27 NOTE — Progress Notes (Signed)
Reviewed and agree with management. Robert D. Kaplan, M.D., FACG  

## 2014-03-01 ENCOUNTER — Telehealth: Payer: Self-pay | Admitting: Gastroenterology

## 2014-03-01 NOTE — Telephone Encounter (Signed)
The patient would like a letter stating due to his medical condition he needs a bidet, an over-bed hospital table and disposable bed liners. He wants  Medicaid to authorize payment for these things.

## 2014-03-01 NOTE — Telephone Encounter (Signed)
Letter in your office for review and to sign if you approve.

## 2014-03-01 NOTE — Telephone Encounter (Signed)
Okay to provide letter

## 2014-03-04 ENCOUNTER — Telehealth: Payer: Self-pay

## 2014-03-04 NOTE — Telephone Encounter (Signed)
Message copied by Chrystie NoseHUNT, Courtez Twaddle R on Mon Mar 04, 2014  3:48 PM ------      Message from: Mike GipESTERWOOD, [name] S      Created: Wed Feb 27, 2014  4:10 PM       Bonita QuinLinda- [facility] Epifania GoreKoruda       ----- Message -----         From: Louis Meckelobert D Kaplan, MD         Sent: 02/27/2014   4:05 PM           To: [name] S Esterwood, PA-C            That would be fine.      ----- Message -----         From: Sammuel CooperAmy S Esterwood, PA-C         Sent: 02/27/2014  11:25 AM           To: Lily LovingsLinda Ray Blessen Kimbrough, RN, Louis Meckelobert D Kaplan, MD            Rob,..the patient has appt with Dr.Herfarth/[facility] in September. He is ready to proceed  with surgery so I  would like to get surgical referral in the works- Dr. Rhea BeltonPyrtle has [facility]  [facility].... Can we get him an appt  With Dr. Epifania GoreKoruda  please?             ------

## 2014-03-04 NOTE — Telephone Encounter (Signed)
Called to schedule appt with Dr. Epifania Gore at [facility] at [phone]. Left message for them to call back regarding scheduling an appt. Fax [phone]. Clinic [phone]. Pt scheduled to see Dr. Epifania Gore at [facility] 03/29/14@9 :30am. [facility] will send pt info as to where to go for appt. Records faxed to [phone].

## 2014-03-11 ENCOUNTER — Telehealth: Payer: Self-pay | Admitting: Gastroenterology

## 2014-03-11 NOTE — Telephone Encounter (Signed)
OK to renew #40 

## 2014-03-11 NOTE — Telephone Encounter (Signed)
Last filled for #40 on 03/01/14-okay to fill?

## 2014-03-12 ENCOUNTER — Other Ambulatory Visit: Payer: Self-pay

## 2014-03-12 MED ORDER — HYDROCODONE-ACETAMINOPHEN 5-325 MG PO TABS: ORAL_TABLET | ORAL | Status: DC

## 2014-03-12 NOTE — Telephone Encounter (Signed)
Printed and ready for signature.

## 2014-03-12 NOTE — Telephone Encounter (Signed)
Patient aware the prescription is at the front desk to be picked up

## 2014-03-14 ENCOUNTER — Encounter: Payer: Self-pay | Admitting: Gastroenterology

## 2014-03-15 ENCOUNTER — Other Ambulatory Visit: Payer: Self-pay

## 2014-03-15 ENCOUNTER — Telehealth: Payer: Self-pay | Admitting: Gastroenterology

## 2014-03-15 MED ORDER — VSL#3 DS PO PACK: 1.0000 | PACK | Freq: Four times a day (QID) | ORAL | Status: AC

## 2014-03-15 NOTE — Telephone Encounter (Signed)
Beth spoke with patient, she had already faxed form back to company today

## 2014-03-19 ENCOUNTER — Telehealth: Payer: Self-pay | Admitting: Gastroenterology

## 2014-03-20 ENCOUNTER — Telehealth: Payer: Self-pay

## 2014-03-20 ENCOUNTER — Other Ambulatory Visit: Payer: Self-pay

## 2014-03-20 DIAGNOSIS — K51218 Ulcerative (chronic) proctitis with other complication: Secondary | ICD-10-CM

## 2014-03-20 MED ORDER — GLYCOPYRROLATE 2 MG PO TABS: 2.0000 mg | ORAL_TABLET | Freq: Two times a day (BID) | ORAL | Status: AC

## 2014-03-20 MED ORDER — HYDROCODONE-ACETAMINOPHEN 5-325 MG PO TABS: ORAL_TABLET | ORAL | Status: DC

## 2014-03-20 NOTE — Telephone Encounter (Signed)
1) DC hyoscyamine 2) Glycopyrrolate 2 mg bid # 60 5 refills - can reduce cramps 3) Refill the Hydrocodone 4/d until surgery appt + 2 days

## 2014-03-20 NOTE — Telephone Encounter (Signed)
We can refill but find out how he is taking it as he is probably taking > 4/day  Is 5 mg doing enough?  Also - does he still have an appointment to see [facility] next week?

## 2014-03-20 NOTE — Telephone Encounter (Signed)
Patient communicated with via 'MyChart" and given detailed instructions. Written prescriptions provided for him to pick up.

## 2014-03-20 NOTE — Telephone Encounter (Signed)
This is a patient of Dr Arlyce DiceKaplan. He is a [facility] patient asking for a refill on Hydrocodone. He last got #40 on 03/12/14 and max daily dosage is 4/day. Please advise.

## 2014-03-20 NOTE — Telephone Encounter (Signed)
See other phone note

## 2014-03-20 NOTE — Telephone Encounter (Signed)
Beth working on this

## 2014-03-21 ENCOUNTER — Encounter: Payer: Self-pay | Admitting: Gastroenterology

## 2014-03-22 ENCOUNTER — Encounter (HOSPITAL_COMMUNITY)
Admission: RE | Admit: 2014-03-22 | Discharge: 2014-03-22 | Disposition: A | Discharge status: Home / Self Care | Payer: BC Managed Care – PPO | Source: Ambulatory Visit | Attending: Gastroenterology | Admitting: Gastroenterology

## 2014-03-22 ENCOUNTER — Other Ambulatory Visit (HOSPITAL_COMMUNITY): Payer: Self-pay | Admitting: Gastroenterology

## 2014-03-22 ENCOUNTER — Encounter (HOSPITAL_COMMUNITY): Payer: Self-pay

## 2014-03-22 VITALS — BP 104/66 | HR 95 | Temp 97.9°F | Resp 20 | Ht 74.0 in | Wt 193.4 lb

## 2014-03-22 DIAGNOSIS — K51218 Ulcerative (chronic) proctitis with other complication: Secondary | ICD-10-CM

## 2014-03-22 DIAGNOSIS — K5120 Ulcerative (chronic) proctitis without complications: Secondary | ICD-10-CM | POA: Insufficient documentation

## 2014-03-22 MED ORDER — DIPHENHYDRAMINE HCL 25 MG PO CAPS
50.0000 mg | ORAL_CAPSULE | Freq: Once | ORAL | Status: AC
  Administered 2014-03-22: 50 mg via ORAL
  Filled 2014-03-22: qty 2

## 2014-03-22 MED ORDER — SODIUM CHLORIDE 0.9 % IV SOLN
5.0000 mg/kg | Freq: Once | INTRAVENOUS | Status: AC
  Administered 2014-03-22: 400 mg via INTRAVENOUS
  Filled 2014-03-22: qty 40

## 2014-03-22 MED ORDER — ACETAMINOPHEN 325 MG PO TABS
650.0000 mg | ORAL_TABLET | Freq: Once | ORAL | Status: AC
  Administered 2014-03-22: 650 mg via ORAL
  Filled 2014-03-22: qty 2

## 2014-03-22 MED ORDER — SODIUM CHLORIDE 0.9 % IV SOLN
Freq: Once | INTRAVENOUS | Status: AC
  Administered 2014-03-22: 08:00:00 via INTRAVENOUS

## 2014-03-22 NOTE — Discharge Instructions (Signed)
Infliximab injection °What is this medicine? °INFLIXIMAB (in FLIX i mab) is used to treat Crohn's disease and ulcerative colitis. It is also used to treat ankylosing spondylitis, psoriasis, and some forms of arthritis. °This medicine may be used for other purposes; ask your health care provider or pharmacist if you have questions. °COMMON BRAND NAME(S): Remicade °What should I tell my health care provider before I take this medicine? °They need to know if you have any of these conditions: °-diabetes °-exposure to tuberculosis °-heart failure °-hepatitis or liver disease °-immune system problems °-infection °-lung or breathing disease, like COPD °-multiple sclerosis °-current or past resident of [place] °-seizure disorder °-an unusual or allergic reaction to infliximab, mouse proteins, other medicines, foods, dyes, or preservatives °-pregnant or trying to get pregnant °-breast-feeding °How should I use this medicine? °This medicine is for injection into a vein. It is usually given by a health care professional in a hospital or clinic setting. °A special MedGuide will be given to you by the pharmacist with each prescription and refill. Be sure to read this information carefully each time. °Talk to your pediatrician regarding the use of this medicine in children. Special care may be needed. °Overdosage: If you think you have taken too much of this medicine contact a poison control center or emergency room at once. °NOTE: This medicine is only for you. Do not share this medicine with others. °What if I miss a dose? °It is important not to miss your dose. Call your doctor or health care professional if you are unable to keep an appointment. °What may interact with this medicine? °Do not take this medicine with any of the following medications: °-anakinra °-rilonacept °This medicine may also interact with the following medications: °-vaccines °This list may not describe all possible interactions.  Give your health care provider a list of all the medicines, herbs, non-prescription drugs, or dietary supplements you use. Also tell them if you smoke, drink alcohol, or use illegal drugs. Some items may interact with your medicine. °What should I watch for while using this medicine? °Visit your doctor or health care professional for regular checks on your progress. °If you get a cold or other infection while receiving this medicine, call your doctor or health care professional. Do not treat yourself. This medicine may decrease your body's ability to fight infections. Before beginning therapy, your doctor may do a test to see if you have been exposed to tuberculosis. °This medicine may make the symptoms of heart failure worse in some patients. If you notice symptoms such as increased shortness of breath or swelling of the ankles or legs, contact your health care provider right away. °If you are going to have surgery or dental work, tell your health care professional or dentist that you have received this medicine. °If you take this medicine for plaque psoriasis, stay out of the sun. If you cannot avoid being in the sun, wear protective clothing and use sunscreen. Do not use sun lamps or tanning beds/booths. °What side effects may I notice from receiving this medicine? °Side effects that you should report to your doctor or health care professional as soon as possible: °-allergic reactions like skin rash, itching or hives, swelling of the face, lips, or tongue °-chest pain °-fever or chills, usually related to the infusion °-muscle or joint pain °-red, scaly patches or raised bumps on the skin °-signs of infection - fever or chills, cough, sore throat, pain or difficulty passing urine °-swollen lymph nodes   in the neck, underarm, or groin areas °-unexplained weight loss °-unusual bleeding or bruising °-unusually weak or tired °-yellowing of the eyes or skin °Side effects that usually do not require medical attention  (report to your doctor or health care professional if they continue or are bothersome): °-headache °-heartburn or stomach pain °-nausea, vomiting °This list may not describe all possible side effects. Call your doctor for medical advice about side effects. You may report side effects to FDA at 1-800-FDA-1088. °Where should I keep my medicine? °This drug is given in a hospital or clinic and will not be stored at home. °NOTE: This sheet is a summary. It may not cover all possible information. If you have questions about this medicine, talk to your doctor, pharmacist, or health care provider. °© 2015, [employer]. (2008-03-20 10:26:02) ° °

## 2014-03-25 ENCOUNTER — Ambulatory Visit: Payer: Self-pay | Admitting: Physician Assistant

## 2014-03-26 ENCOUNTER — Telehealth: Payer: Self-pay | Admitting: *Deleted

## 2014-03-26 NOTE — Telephone Encounter (Signed)
I advised [name] that [name] will see him on Thursday 8-13 and she will address/give him his prescription at that time.  He agreed to that.

## 2014-03-28 ENCOUNTER — Encounter: Payer: Self-pay | Admitting: Physician Assistant

## 2014-03-28 ENCOUNTER — Ambulatory Visit (INDEPENDENT_AMBULATORY_CARE_PROVIDER_SITE_OTHER): Payer: BC Managed Care – PPO | Admitting: Physician Assistant

## 2014-03-28 VITALS — BP 114/72 | HR 100 | Ht 74.0 in | Wt 201.0 lb

## 2014-03-28 DIAGNOSIS — K5190 Ulcerative colitis, unspecified, without complications: Secondary | ICD-10-CM

## 2014-03-28 MED ORDER — HYDROCODONE-ACETAMINOPHEN 5-325 MG PO TABS: ORAL_TABLET | ORAL | Status: DC

## 2014-03-28 NOTE — Progress Notes (Signed)
Subjective:    Patient ID: [name], male    DOB: 04/14/1981, 33 y.o.   MRN: [id number]  HPI  [name] is a 33 year old white male well-known to Dr. Arlyce DiceKaplan who has severe universal ulcerative colitis. He was last seen in the office on 02/26/2014 and at that time had been unable to wean off prednisone. When he gets down below 40 mg his symptoms increase. He has again tried to taper himself down over the past 3 weeks but says that he began having increased abdominal pain and more frequent loose stools and has gone back up to 40 mg this week. He is seeing small amounts of blood in his stools but not grossly bloody stools. He continues to complain of generalized fatigue. He is requiring hydrocodone for abdominal pain. He had a Remicade infusion 1 week ago and says that he generally cannot tell much difference after Remicade at this point. He is also on Imuran 150 mg daily , and Lialda 4.8 g daily. He has his initial surgical appointment tomorrow with Dr[facility] Epifania [facility]oruda  [facility] and still has an appointment scheduled with Dr. Gwenith SpitzHerfarth  the first week of September at [facility]. He is ready to proceed with surgery as he is disabled from his disease at this time. He mentions that he needs a letter for section 8 housing sohe can get assistance and not have to move from his current living situation where he is renting a room in a 3 bedroom  house.    Review of Systems  Constitutional: Positive for fatigue.  HENT: Negative.   Eyes: Negative.   Respiratory: Negative.   Cardiovascular: Negative.   Gastrointestinal: Positive for abdominal pain, diarrhea and blood in stool.  Endocrine: Negative.   Genitourinary: Negative.   Musculoskeletal: Negative.   Allergic/Immunologic: Negative.   Neurological: Negative.   Hematological: Negative.   Psychiatric/Behavioral: Negative.    Outpatient Prescriptions Prior to Visit  Medication Sig Dispense Refill  . ALPRAZolam (XANAX) 1 MG tablet Take 2 mg by mouth 3  (three) times daily as needed for anxiety.      Marland Kitchen. amphetamine-dextroamphetamine (ADDERALL) 20 MG tablet Take 20 mg by mouth 4 (four) times daily.      Marland Kitchen. azaTHIOprine (IMURAN) 50 MG tablet TAKE (3) TABLETS DAILY.  90 tablet  3  . EPINEPHrine (EPIPEN) 0.3 mg/0.3 mL SOAJ injection Inject 0.3 mLs (0.3 mg total) into the muscle as needed.  1 Device  1  . folic acid (FOLVITE) 1 MG tablet TAKE 1 TABLET EACH DAY.  30 tablet  3  . gabapentin (NEURONTIN) 100 MG capsule Take 100 mg by mouth 3 (three) times daily. For anxiety      . glycopyrrolate (ROBINUL) 2 MG tablet Take 1 tablet (2 mg total) by mouth 2 (two) times daily.  60 tablet  5  . lamoTRIgine (LAMICTAL) 200 MG tablet Take 1 tablet (200 mg total) by mouth daily. For mood stabilization.      Marland Kitchen. LIALDA 1.2 G EC tablet TAKE 4 TABLETS IN THE MORNING WITH BREAKFAST.  120 tablet  3  . Multiple Vitamin (MULTIVITAMIN WITH MINERALS) TABS Take 1 tablet by mouth daily. As a nutritional supplement.  30 tablet  0  . predniSONE (DELTASONE) 20 MG tablet Take 40 mg for two weeks and then decrease to 30 mg once daily until follow up visit  60 tablet  0  . Probiotic Product (VSL#3 DS) PACK Take 1 each by mouth 4 (four) times daily.  6 each  0  . promethazine (PHENERGAN) 25 MG tablet TAKE 1 TABLET EVERY 6 HOURS AS NEEDED FOR NAUSEA & VOMITING.  30 tablet  1  . sodium chloride 0.9 % SOLN with inFLIXimab 100 MG SOLR Inject 5 mg/kg into the vein every 8 (eight) weeks.       . traZODone (DESYREL) 100 MG tablet Take 2 tablets (200 mg total) by mouth at bedtime as needed for sleep.      . ziprasidone (GEODON) 40 MG capsule Take 40-80 mg by mouth 2 (two) times daily with a meal. Takes 80mg  in the morning and 40mg  in the evening      . zolpidem (AMBIEN) 10 MG tablet Take 10 mg by mouth at bedtime.       Marland Kitchen HYDROcodone-acetaminophen (NORCO/VICODIN) 5-325 MG per tablet Take 1 pill every 6 hours as needed for pain but no more than 4 per day.  40 tablet  0   No  facility-administered medications prior to visit.   Allergies  Allergen Reactions  . Cephalexin     REACTION: urticaria (hives)   Patient Active Problem List   Diagnosis Date Noted  . Feeding difficulties and mismanagement 07/20/2013  . Immunocompromised 07/20/2013  . Protein-calorie malnutrition, severe 07/09/2013  . EKG, abnormal 05/14/2012  . Bipolar II disorder 05/12/2012  . Attention deficit hyperactivity disorder (ADHD) 05/12/2012  . INSOMNIA-SLEEP DISORDER-UNSPEC 05/06/2010  . HYPERCHOLESTEROLEMIA, PURE 06/14/2008  . ANXIETY 11/27/2007  . DEPRESSION 11/27/2007  . COLITIS, ULCERATIVE 04/26/2001   History  Substance Use Topics  . Smoking status: Never Smoker   . Smokeless tobacco: Never Used  . Alcohol Use: No   family history includes Leukemia in his maternal grandmother; Lymphoma in his paternal grandmother. There is no history of Colon cancer.     Objective:   Physical Exam   well-developed white male in no acute distress blood pressure 114/72 pulse 100 height 6 foot 2 weight 201. HEENT; nontraumatic normocephalic EOMI PERRLA sclera anicteric, Supple; no JVD, Cardiovascula;r regular rate and rhythm with S1-S2 no murmur or gallop, Pulmonary; clear bilaterally, Abdomen ;soft he is tender in the left abdomen and has mild diffuse tenderness no guarding or rebound no palpable mass or hepatosplenomegaly bowel sounds are present, Rectal; exam not done, capture we is no clubbing cyanosis or edema skin warm and dry, Psych ;mood and affect appropriate        Assessment & Plan:  #80  33 year old male with severe universal ulcerative colitis steroid dependent and unable to taper even to lower doses of prednisone at this time. He is fairly maxed out on medical therapy on Remicade infusions q. 8 weeks and azathioprine 150 mg daily. #2 anxiety, depression and history of bipolar disorder #3 ADD   Plan; continue current  aggressive medical regimen He will continue prednisone 40 mg  by mouth daily over the next 2 weeks and then decrease to 35 mg by mouth daily x2 weeks and try a very slow gradual taper. He will keep his appointment set [facility]. I think it will be in his best interest to proceed with colectomy and hopefully that will be scheduled within the next month. Hydrocodone was refilled today #40

## 2014-03-28 NOTE — Patient Instructions (Signed)
We have printed a prescription for Hydrocodone for you to take to your pharmacy.   Stay on Prednsione 40 mg for 2 weeks, then reduce to 35 mg and stay on this dose until appt with Dr. Arlyce Dice.   You follow up appointment is with Dr. Arlyce Dice on 05/23/14 at 8:45am.

## 2014-03-28 NOTE — Progress Notes (Signed)
Reviewed and agree with management. Srihith Aquilino D. Ellyn Rubiano, M.D., FACG  

## 2014-04-01 ENCOUNTER — Other Ambulatory Visit: Payer: Self-pay | Admitting: Gastroenterology

## 2014-04-04 ENCOUNTER — Telehealth: Payer: Self-pay | Admitting: Gastroenterology

## 2014-04-04 NOTE — Telephone Encounter (Signed)
Patient wants refill of pain med Dr Arlyce DiceKaplan

## 2014-04-05 MED ORDER — HYDROCODONE-ACETAMINOPHEN 5-325 MG PO TABS: ORAL_TABLET | ORAL | Status: DC

## 2014-04-05 NOTE — Telephone Encounter (Signed)
Ok to refill #40 tabs

## 2014-04-05 NOTE — Telephone Encounter (Signed)
Called patient to inform med printed and signed for him to come by the office and pick it up

## 2014-04-10 ENCOUNTER — Telehealth: Payer: Self-pay | Admitting: Gastroenterology

## 2014-04-10 NOTE — Telephone Encounter (Signed)
Please inquire why he requires a 2 bedroom apartment

## 2014-04-10 NOTE — Telephone Encounter (Signed)
Patient did get the [employer] make an offer to his landlord for rent, but it was so low the landlord declined. The patient will have to move. He now needs a letter stating his needs for a 2 bedroom with Internet included and some other amenities. He will write this all down and make a form letter to follow. Please advise.

## 2014-04-10 NOTE — Telephone Encounter (Signed)
He is going to bring me in written information. The list starts with having so many clothes because of his widely varying weight, having to change clothes often due to incontinence, ect. I will wait for the list and share it with you.

## 2014-04-11 ENCOUNTER — Telehealth: Payer: Self-pay | Admitting: Gastroenterology

## 2014-04-12 NOTE — Telephone Encounter (Signed)
Dr Arlyce Dice,  We just gave him 40 on the 20th   He wants another refill

## 2014-04-12 NOTE — Telephone Encounter (Signed)
Called pt to inform it may be Monday before he can get his refill  He agreed that was ok

## 2014-04-12 NOTE — Telephone Encounter (Signed)
Dr Juanda Chance you are doc of the day  Will you refill this patients pain med??

## 2014-04-13 NOTE — Telephone Encounter (Signed)
Correction!! I will NOT approve that! Defer to Dr Arlyce Dice. Thanx

## 2014-04-13 NOTE — Telephone Encounter (Signed)
He took 40 in just 1 week. I will approve that . Defer to Dr Arlyce Dice

## 2014-04-14 NOTE — Telephone Encounter (Signed)
Do not refill until he is due

## 2014-04-15 ENCOUNTER — Telehealth: Payer: Self-pay | Admitting: Gastroenterology

## 2014-04-15 ENCOUNTER — Telehealth: Payer: Self-pay | Admitting: *Deleted

## 2014-04-15 MED ORDER — HYDROCODONE-ACETAMINOPHEN 5-325 MG PO TABS: ORAL_TABLET | ORAL | Status: DC

## 2014-04-15 NOTE — Telephone Encounter (Signed)
Message copied by Marlowe Kays on Mon Apr 15, 2014  1:06 PM ------      Message from: Karna Christmas D      Created: Mon Apr 15, 2014 12:57 PM       Pt wants to know if his prescription is ready      #[phone] ------

## 2014-04-15 NOTE — Telephone Encounter (Signed)
Called pt to inform Dr Arlyce Dice signed and script is out front

## 2014-04-15 NOTE — Telephone Encounter (Signed)
When can we refill for him Dr Arlyce Dice so I can let him know   10 days right??because if that's the case he can have a refill today  4 per day at 10 days from the 20th was yesterday

## 2014-04-15 NOTE — Telephone Encounter (Signed)
Ok will send him 40 and 0 refills

## 2014-04-15 NOTE — Telephone Encounter (Signed)
Called pt to inform that I have to get the script signed by Dr Arlyce Dice

## 2014-04-15 NOTE — Telephone Encounter (Signed)
Yes today is ok for refill

## 2014-04-15 NOTE — Telephone Encounter (Signed)
Advised patient it is not ready but I will contact him as soon as it is. Dr Arlyce Dice is at the hospital this week.

## 2014-04-23 ENCOUNTER — Other Ambulatory Visit: Payer: BC Managed Care – PPO

## 2014-04-23 ENCOUNTER — Other Ambulatory Visit: Payer: Self-pay

## 2014-04-23 ENCOUNTER — Telehealth: Payer: Self-pay | Admitting: Gastroenterology

## 2014-04-23 ENCOUNTER — Other Ambulatory Visit: Payer: Self-pay | Admitting: *Deleted

## 2014-04-23 DIAGNOSIS — Z79899 Other long term (current) drug therapy: Secondary | ICD-10-CM

## 2014-04-23 DIAGNOSIS — Z9229 Personal history of other drug therapy: Secondary | ICD-10-CM

## 2014-04-23 NOTE — Telephone Encounter (Signed)
Ok to drug screen per Dr Arlyce Dice

## 2014-04-23 NOTE — Telephone Encounter (Signed)
Spoke with the patient. Confirmed he will increase the prednisone to 40 mg. He agrees to come in today for labs. Contact with the lab to confirm Urine Drug screen with reflex is what is needed.

## 2014-04-23 NOTE — Telephone Encounter (Signed)
Dr Arlyce Dice, I have responded to most of these issues. I have not responded to the concern of a "flare" "having blood, mucus, diarrhea in my stools frequently throughout the day with significant pain". Would you want him to come in? I can get him in this week if you feels he needs evaluation. Please advise.

## 2014-04-23 NOTE — Telephone Encounter (Signed)
Dr Arlyce Dice  He is wanting more pain meds We should drug screen him before any more refills. He is calling and its actually 2 days early  We need to check and see if these meds are in his system , But you are the doc its your call.

## 2014-04-24 ENCOUNTER — Encounter: Payer: Self-pay | Admitting: *Deleted

## 2014-04-24 LAB — DRUG SCREEN, URINE
Amphetamine Screen, Ur: POSITIVE — AB
Barbiturate Quant, Ur: NEGATIVE
Benzodiazepines.: POSITIVE — AB
COCAINE METABOLITES: NEGATIVE
Creatinine,U: 38.33 mg/dL
Marijuana Metabolite: NEGATIVE
Methadone: NEGATIVE
OPIATES: NEGATIVE
PHENCYCLIDINE (PCP): NEGATIVE
Propoxyphene: NEGATIVE

## 2014-04-24 NOTE — Telephone Encounter (Signed)
You can inform him that the drug test indicated that there are no narcotics in his system.  Therefore we conclude that he is not using the medication.  We will not prescribe any more medications for him and we are going to dismiss him from the practice.

## 2014-04-24 NOTE — Telephone Encounter (Signed)
Spoke with patient about drug screen, Patient very defensive. Spoke with patient for 20 minutes. Finally Sheri had to take over the phone conversation. He claims he is taking med as prescribed. As needed. I explained to him if he was going days at a time not taking the med then he should not be running out before his rx is due

## 2014-04-24 NOTE — Telephone Encounter (Signed)
Dr Arlyce Dice, Patient is getting anxious wants his script, he called and wants to know his drug lab results

## 2014-04-24 NOTE — Telephone Encounter (Signed)
I spoke with the patient for about 20 minutes, after [name] spoke with him.  I listened to his concerns about discharge.  Patient was notified that opioids were not detectable in his labs, and that he has been discharged from the practice.   He states that he had not taken his hydrocodone for 1 or 2 days prior to the lab work.  He believes that is why it was not detectable, "it's gone in 24 hours".  I explained that he drug would be detectable if he had taken it for more than 24 hours.  Patient reports that he takes it as needed and yesterday was a "good day and not needed".  When questioned why he is calling for the refill when he is "not needing it" he stated "so I have it if I do".  Patient notified that he will receive a letter in writing with [name] decision.  Patient continued to profess that he was taking the drugs and still needed them.  I explained that we would not be refilling the prescription any longer and that he has been discharged.  I explained that I was hanging up the phone and I did so.  Prior to hanging up patient stated he is going to send a message via my chart explaining why he thinks we are incorrect.  I explained that it will be forwarded to [name], but the decision stands.  I reviewed all the above with [name] after my conversation.

## 2014-04-25 ENCOUNTER — Telehealth: Payer: Self-pay | Admitting: Gastroenterology

## 2014-04-25 NOTE — Telephone Encounter (Signed)
Dismissal Letter sent by Certified Mail 04/25/2014  Received the Return Receipt showing the patient picked up Dismissal 04/29/14

## 2014-04-30 ENCOUNTER — Encounter: Payer: Self-pay | Admitting: Gastroenterology

## 2014-05-16 ENCOUNTER — Other Ambulatory Visit: Payer: Self-pay

## 2014-05-16 DIAGNOSIS — K50919 Crohn's disease, unspecified, with unspecified complications: Secondary | ICD-10-CM

## 2014-05-17 ENCOUNTER — Other Ambulatory Visit (HOSPITAL_COMMUNITY): Payer: Self-pay | Admitting: Gastroenterology

## 2014-05-17 ENCOUNTER — Encounter (HOSPITAL_COMMUNITY)
Admission: RE | Admit: 2014-05-17 | Discharge: 2014-05-17 | Disposition: A | Discharge status: Home / Self Care | Payer: BC Managed Care – PPO | Source: Ambulatory Visit | Attending: Gastroenterology | Admitting: Gastroenterology

## 2014-05-17 ENCOUNTER — Encounter (HOSPITAL_COMMUNITY): Payer: Self-pay

## 2014-05-17 VITALS — BP 114/65 | HR 115 | Temp 97.3°F | Resp 18 | Ht 74.0 in | Wt 197.5 lb

## 2014-05-17 DIAGNOSIS — K50919 Crohn's disease, unspecified, with unspecified complications: Secondary | ICD-10-CM

## 2014-05-17 MED ORDER — SODIUM CHLORIDE 0.9 % IV SOLN
5.0000 mg/kg | Freq: Once | INTRAVENOUS | Status: AC
  Administered 2014-05-17: 400 mg via INTRAVENOUS
  Filled 2014-05-17: qty 40

## 2014-05-17 MED ORDER — SODIUM CHLORIDE 0.9 % IV SOLN
INTRAVENOUS | Status: DC
  Administered 2014-05-17: 08:00:00 via INTRAVENOUS

## 2014-05-17 MED ORDER — SODIUM CHLORIDE 0.9 % IV SOLN
5.0000 mg/kg | Freq: Once | INTRAVENOUS | Status: DC
  Filled 2014-05-17: qty 50

## 2014-05-17 MED ORDER — DIPHENHYDRAMINE HCL 25 MG PO CAPS
50.0000 mg | ORAL_CAPSULE | Freq: Once | ORAL | Status: AC
  Administered 2014-05-17: 50 mg via ORAL
  Filled 2014-05-17: qty 2

## 2014-05-17 MED ORDER — ACETAMINOPHEN 325 MG PO TABS
650.0000 mg | ORAL_TABLET | Freq: Once | ORAL | Status: AC
  Administered 2014-05-17: 650 mg via ORAL
  Filled 2014-05-17: qty 2

## 2014-05-23 ENCOUNTER — Ambulatory Visit: Payer: Self-pay | Admitting: Gastroenterology

## 2014-05-26 ENCOUNTER — Encounter: Payer: Self-pay | Admitting: Gastroenterology

## 2014-05-29 ENCOUNTER — Telehealth: Payer: Self-pay | Admitting: Gastroenterology

## 2014-05-29 ENCOUNTER — Encounter: Payer: Self-pay | Admitting: Gastroenterology

## 2014-05-29 ENCOUNTER — Encounter: Payer: Self-pay | Admitting: Internal Medicine

## 2014-05-29 NOTE — Telephone Encounter (Signed)
Mychart message sent.

## 2014-05-29 NOTE — Telephone Encounter (Signed)
This form is not scanned into the system. I really don't know what else to tel him. The form was completed and signed. Can you talk with him?

## 2014-07-08 ENCOUNTER — Encounter: Payer: Self-pay | Admitting: Gastroenterology

## 2014-07-08 NOTE — Telephone Encounter (Signed)
Patient has been discharged from the practice.  Discharge letter he received stated we would provide care for 33 days.

## 2014-07-16 ENCOUNTER — Encounter: Payer: Self-pay | Admitting: Gastroenterology

## 2014-07-18 ENCOUNTER — Encounter (HOSPITAL_COMMUNITY): Admission: RE | Admit: 2014-07-18 | Payer: BC Managed Care – PPO | Source: Ambulatory Visit

## 2015-03-13 ENCOUNTER — Encounter: Payer: Self-pay | Admitting: Gastroenterology

## 2015-03-17 ENCOUNTER — Emergency Department (HOSPITAL_COMMUNITY)
Admission: EM | Admit: 2015-03-17 | Discharge: 2015-03-19 | Disposition: A | Discharge status: Other Acute Inpatient Hospital | Payer: BLUE CROSS/BLUE SHIELD | Attending: Emergency Medicine | Admitting: Emergency Medicine

## 2015-03-17 ENCOUNTER — Encounter (HOSPITAL_COMMUNITY): Payer: Self-pay | Admitting: Emergency Medicine

## 2015-03-17 DIAGNOSIS — K5190 Ulcerative colitis, unspecified, without complications: Secondary | ICD-10-CM | POA: Diagnosis not present

## 2015-03-17 DIAGNOSIS — F909 Attention-deficit hyperactivity disorder, unspecified type: Secondary | ICD-10-CM | POA: Diagnosis not present

## 2015-03-17 DIAGNOSIS — G4700 Insomnia, unspecified: Secondary | ICD-10-CM | POA: Insufficient documentation

## 2015-03-17 DIAGNOSIS — R440 Auditory hallucinations: Secondary | ICD-10-CM | POA: Diagnosis present

## 2015-03-17 DIAGNOSIS — F419 Anxiety disorder, unspecified: Secondary | ICD-10-CM | POA: Diagnosis not present

## 2015-03-17 DIAGNOSIS — F23 Brief psychotic disorder: Secondary | ICD-10-CM

## 2015-03-17 DIAGNOSIS — F319 Bipolar disorder, unspecified: Secondary | ICD-10-CM | POA: Diagnosis not present

## 2015-03-17 DIAGNOSIS — Z7952 Long term (current) use of systemic steroids: Secondary | ICD-10-CM | POA: Diagnosis not present

## 2015-03-17 DIAGNOSIS — Z79899 Other long term (current) drug therapy: Secondary | ICD-10-CM | POA: Insufficient documentation

## 2015-03-17 DIAGNOSIS — F312 Bipolar disorder, current episode manic severe with psychotic features: Secondary | ICD-10-CM | POA: Diagnosis not present

## 2015-03-17 HISTORY — DX: Irritable bowel syndrome, unspecified: K58.9

## 2015-03-17 LAB — COMPREHENSIVE METABOLIC PANEL
ALT: 33 U/L (ref 17–63)
ANION GAP: 11 (ref 5–15)
AST: 36 U/L (ref 15–41)
Albumin: 4.5 g/dL (ref 3.5–5.0)
Alkaline Phosphatase: 51 U/L (ref 38–126)
BILIRUBIN TOTAL: 1.2 mg/dL (ref 0.3–1.2)
BUN: 17 mg/dL (ref 6–20)
CALCIUM: 9.1 mg/dL (ref 8.9–10.3)
CO2: 24 mmol/L (ref 22–32)
Chloride: 98 mmol/L — ABNORMAL LOW (ref 101–111)
Creatinine, Ser: 1.05 mg/dL (ref 0.61–1.24)
GFR calc Af Amer: >60 mL/min (ref >60)
GFR calc non Af Amer: >60 mL/min (ref >60)
Glucose, Bld: 119 mg/dL — ABNORMAL HIGH (ref 65–99)
Potassium: 3.2 mmol/L — ABNORMAL LOW (ref 3.5–5.1)
Sodium: 133 mmol/L — ABNORMAL LOW (ref 135–145)
TOTAL PROTEIN: 8.1 g/dL (ref 6.5–8.1)

## 2015-03-17 LAB — CBC WITH DIFFERENTIAL/PLATELET
BASOS PCT: 0 % (ref 0–1)
Basophils Absolute: 0 10*3/uL (ref 0.0–0.1)
EOS ABS: 0 10*3/uL (ref 0.0–0.7)
EOS PCT: 1 % (ref 0–5)
HCT: 42.8 % (ref 39.0–52.0)
Hemoglobin: 14.7 g/dL (ref 13.0–17.0)
Lymphocytes Relative: 33 % (ref 12–46)
Lymphs Abs: 2.1 10*3/uL (ref 0.7–4.0)
MCH: 31.8 pg (ref 26.0–34.0)
MCHC: 34.3 g/dL (ref 30.0–36.0)
MCV: 92.6 fL (ref 78.0–100.0)
MONO ABS: 0.7 10*3/uL (ref 0.1–1.0)
Monocytes Relative: 12 % (ref 3–12)
NEUTROS PCT: 54 % (ref 43–77)
Neutro Abs: 3.5 10*3/uL (ref 1.7–7.7)
PLATELETS: 400 10*3/uL (ref 150–400)
RBC: 4.62 MIL/uL (ref 4.22–5.81)
RDW: 14.2 % (ref 11.5–15.5)
WBC: 6.4 10*3/uL (ref 4.0–10.5)

## 2015-03-17 LAB — ETHANOL: Alcohol, Ethyl (B): <5 mg/dL (ref <5)

## 2015-03-17 NOTE — ED Provider Notes (Signed)
CSN: [id number]     Arrival date & time 03/17/15  2127 History  This chart was scribed for Benjamin Fossa, MD by Evon Slack, ED Scribe. This patient was seen in room B14C/B14C and the patient's care was started at 10:59 PM.    Chief Complaint  Patient presents with  . Hallucinations   Patient is a 34 y.o. male presenting with mental health disorder. The history is provided by the patient. No language interpreter was used.  Mental Health Problem Presenting symptoms: hallucinations   Presenting symptoms: no homicidal ideas and no suicidal thoughts   Degree of incapacity (severity):  Moderate Onset quality:  Gradual Duration:  1 week Chronicity:  Recurrent Context: not alcohol use and not drug abuse    HPI Comments: Level 5 Caveat: Psychiatric Disorder   [name] is a 34 y.o. male with PMHx of anxiety, Insomnia, ADHD, and depression who presents to the Emergency Department complaining of auditory and visual hallucinations onset 1 week prior. Pt sates that he his also having "racing thoughts" for a while now. Pt states that the auditory hallucinations are a man telling him what to do but he is not able to describe what the man is saying. Pt states that his visual hallucinations are described as seeing "dots." Pt states that he has not been compliant with his psychiatric medications and is unsure of what they all are. Pt states that he lives alone and brought himself in for psychiatric treatment. Pt denies SI or HI.  Pt denies tobacco use. Pt report intermittent alcohol use. Pt denies any illicit drugs.   Past Medical History  Diagnosis Date  . Anxiety   . Ulcerative colitis   . Bipolar 1 disorder   . Insomnia   . ADHD (attention deficit hyperactivity disorder)   . Depression   . Excessive weight loss   . IBS (irritable bowel syndrome)    Past Surgical History  Procedure Laterality Date  . Colonoscopy w/ biopsies      hx ulcerative colitis  . Anal fissurectomy      Family History  Problem Relation Age of Onset  . Lymphoma Paternal Grandmother   . Leukemia Maternal Grandmother   . Colon cancer Neg Hx    History  Substance Use Topics  . Smoking status: Never Smoker   . Smokeless tobacco: Never Used  . Alcohol Use: No    Review of Systems  Unable to perform ROS: Psychiatric disorder  Psychiatric/Behavioral: Positive for hallucinations and decreased concentration. Negative for suicidal ideas and homicidal ideas.     Allergies  Cephalexin  Home Medications   Prior to Admission medications   Medication Sig Start Date End Date Taking? Authorizing Provider  ALPRAZolam Prudy Feeler) 1 MG tablet Take 2 mg by mouth 3 (three) times daily as needed for anxiety. 05/15/12   Curlene Labrum Readling, MD  amphetamine-dextroamphetamine (ADDERALL) 20 MG tablet Take 20 mg by mouth 4 (four) times daily.    Historical Provider, MD  azaTHIOprine (IMURAN) 50 MG tablet TAKE (3) TABLETS DAILY. 04/02/14   Louis Meckel, MD  EPINEPHrine (EPIPEN) 0.3 mg/0.3 mL SOAJ injection Inject 0.3 mLs (0.3 mg total) into the muscle as needed. 09/16/13   Elson Areas, PA-C  folic acid (FOLVITE) 1 MG tablet TAKE 1 TABLET EACH DAY. 04/02/14   Louis Meckel, MD  gabapentin (NEURONTIN) 100 MG capsule Take 100 mg by mouth 3 (three) times daily. For anxiety    Historical Provider, MD  glycopyrrolate (ROBINUL) 2 MG  tablet Take 1 tablet (2 mg total) by mouth 2 (two) times daily. 03/20/14   Iva Boop, MD  HYDROcodone-acetaminophen (NORCO/VICODIN) 5-325 MG per tablet Take 1 pill every 6 hours as needed for pain but no more than 4 per day. 04/15/14   Louis Meckel, MD  hyoscyamine (LEVBID) 0.375 MG 12 hr tablet  02/22/14   Historical Provider, MD  lamoTRIgine (LAMICTAL) 200 MG tablet Take 1 tablet (200 mg total) by mouth daily. For mood stabilization. 05/15/12   Ronny Bacon, MD  LIALDA 1.2 G EC tablet TAKE 4 TABLETS IN THE MORNING WITH BREAKFAST. 01/21/14   Louis Meckel, MD  Multiple Vitamin  (MULTIVITAMIN WITH MINERALS) TABS Take 1 tablet by mouth daily. As a nutritional supplement. 05/15/12   Ronny Bacon, MD  predniSONE (DELTASONE) 20 MG tablet Take 40 mg for two weeks and then decrease to 30 mg once daily until follow up visit 02/26/14   [name] S Esterwood, PA-C  Probiotic Product (VSL#3 DS) PACK Take 1 each by mouth 4 (four) times daily. 03/15/14   Louis Meckel, MD  promethazine (PHENERGAN) 25 MG tablet TAKE 1 TABLET EVERY 6 HOURS AS NEEDED FOR NAUSEA & VOMITING.    Louis Meckel, MD  sodium chloride 0.9 % SOLN with inFLIXimab 100 MG SOLR Inject 5 mg/kg into the vein every 8 (eight) weeks.     Historical Provider, MD  traZODone (DESYREL) 100 MG tablet Take 2 tablets (200 mg total) by mouth at bedtime as needed for sleep. 05/15/12   Curlene Labrum Readling, MD  ziprasidone (GEODON) 40 MG capsule Take 40-80 mg by mouth 2 (two) times daily with a meal. Takes 80mg  in the morning and 40mg  in the evening    Historical Provider, MD  zolpidem (AMBIEN) 10 MG tablet Take 10 mg by mouth at bedtime.     Historical Provider, MD   BP 156/104 mmHg  Pulse 141  Temp(Src) 98.9 F (37.2 C)  Resp 18  SpO2 97%   Physical Exam  Constitutional: He is oriented to person, place, and time. He appears well-developed and well-nourished. No distress.  Disheveled.   HENT:  Head: Normocephalic and atraumatic.  Eyes: Conjunctivae and EOM are normal.  Neck: Neck supple. No tracheal deviation present.  Cardiovascular: Normal rate.   Pulmonary/Chest: Effort normal. No respiratory distress.  Musculoskeletal: Normal range of motion.  Healing abrasion over the right dorsal hand.   Neurological: He is alert and oriented to person, place, and time.  Skin: Skin is warm and dry.  Psychiatric: He has a normal mood and affect. His behavior is normal.  Low volume mumbling speech tangential   Nursing note and vitals reviewed.   ED Course  Procedures (including critical care time) DIAGNOSTIC STUDIES: Oxygen  Saturation is 97% on RA, normal by my interpretation.    COORDINATION OF CARE: 11:11 PM-Discussed treatment plan with pt at bedside and pt agreed to plan.     Labs Review Labs Reviewed  COMPREHENSIVE METABOLIC PANEL - Abnormal; Notable for the following:    Sodium 133 (*)    Potassium 3.2 (*)    Chloride 98 (*)    Glucose, Bld 119 (*)    All other components within normal limits  ETHANOL  CBC WITH DIFFERENTIAL/PLATELET  URINE RAPID DRUG SCREEN, HOSP PERFORMED    Imaging Review No results found.   EKG Interpretation None      MDM   Final diagnoses:  Acute psychosis   Patient here for  evaluation of racing thoughts, auditory visual hallucinations. He has a history of inflammatory bowel disease (ulcerative colitis). He is on chronic steroid therapy as well as chronic immunosuppressants. Patient is nontoxic in the emergency department with no evidence of acute flare based on history. Concern for acute psychosis, question of this is related to his ongoing bipolar disorder or chronic steroid therapy. TTS consult pending. Patient has been medically cleared for psychiatric evaluation.    I personally performed the services described in this documentation, which was scribed in my presence. The recorded information has been reviewed and is accurate.      Benjamin Fossa, MD 03/18/15 0600

## 2015-03-17 NOTE — ED Notes (Signed)
Pt. wanded by security at triage .  

## 2015-03-17 NOTE — ED Notes (Signed)
Paper scrubs given to pt. at triage , security paged to wand pt.

## 2015-03-17 NOTE — ED Notes (Signed)
Pt is complaining of fatigue and racing thoughts, pt states that the fatigue started "a long time, a long time, ago".

## 2015-03-17 NOTE — BH Assessment (Addendum)
Reviewed ED notes prior to initiating assessment. Per ED notes pt reports: Hx of anxiety, Insomnia, ADHD, and depression who presents to the Emergency Department complaining of auditory and visual hallucinations onset 1 week prior. Pt sates that he his also having "racing thoughts" for a while now. Pt states that the auditory hallucinations are a man telling him what to do but he is not able to describe what the man is saying. Pt states that his visual hallucinations are described as seeing "dots." Pt states that he has not been compliant with his psychiatric medications and is unsure of what they all are. Pt states that he lives alone and brought himself in for psychiatric treatment. Pt denies SI or HI.  Requested cart be placed with pt for assessment.   Assessment to commence shortly.   First attempt at 0003 did not connect.   Second attempt 0005 did not connect, not ringing at all, circle on page, then stops attempting to connect. Will attempt again in several minutes, and then if it is still not working will request cart 2 be placed with pt for assessment, and place and IT ticket for cart one.    Informed Monica in ED at 0008 and she is checking on cart status.   Cart is stating that it is not registered. Maxine Glenn will attempt to turn it off and then back on again to see if the cart will reboot. Will attempt to call the cart again at 0015. If the cart does not work at that time an Scientist, product/process development will be placed and cart 2 will be used to complete assessments until cart 1 is active again.   0017 cart still not connecting. Placed IT ticket.  Clista Bernhardt, [facility] Triage Specialist 03/17/2015 11:51 PM

## 2015-03-17 NOTE — ED Notes (Signed)
Pt. reports auditory and visual hallucinations , states " racing thoughts" , agitation and increasing anxiety onset this week . Denies suicidal ideation .

## 2015-03-18 DIAGNOSIS — F319 Bipolar disorder, unspecified: Secondary | ICD-10-CM

## 2015-03-18 DIAGNOSIS — F3160 Bipolar disorder, current episode mixed, unspecified: Secondary | ICD-10-CM | POA: Insufficient documentation

## 2015-03-18 DIAGNOSIS — F312 Bipolar disorder, current episode manic severe with psychotic features: Secondary | ICD-10-CM | POA: Diagnosis not present

## 2015-03-18 LAB — RAPID URINE DRUG SCREEN, HOSP PERFORMED
Amphetamines: POSITIVE — AB
BENZODIAZEPINES: POSITIVE — AB
Barbiturates: NOT DETECTED
Cocaine: NOT DETECTED
OPIATES: NOT DETECTED
Tetrahydrocannabinol: NOT DETECTED

## 2015-03-18 MED ORDER — ALPRAZOLAM 0.5 MG PO TABS
1.0000 mg | ORAL_TABLET | Freq: Three times a day (TID) | ORAL | Status: DC
  Administered 2015-03-18: 1 mg via ORAL
  Filled 2015-03-18: qty 2

## 2015-03-18 MED ORDER — ZIPRASIDONE HCL 20 MG PO CAPS
80.0000 mg | ORAL_CAPSULE | Freq: Two times a day (BID) | ORAL | Status: DC
  Administered 2015-03-18 – 2015-03-19 (×2): 80 mg via ORAL
  Filled 2015-03-18 (×2): qty 4

## 2015-03-18 MED ORDER — PREDNISONE 20 MG PO TABS
40.0000 mg | ORAL_TABLET | Freq: Every day | ORAL | Status: DC
  Filled 2015-03-18: qty 2

## 2015-03-18 MED ORDER — ZIPRASIDONE HCL 20 MG PO CAPS
40.0000 mg | ORAL_CAPSULE | Freq: Two times a day (BID) | ORAL | Status: DC
  Administered 2015-03-18: 80 mg via ORAL
  Filled 2015-03-18: qty 2

## 2015-03-18 MED ORDER — TRAZODONE HCL 100 MG PO TABS: 200.0000 mg | ORAL_TABLET | Freq: Every evening | ORAL | Status: DC | PRN

## 2015-03-18 MED ORDER — TRAZODONE HCL 100 MG PO TABS
200.0000 mg | ORAL_TABLET | Freq: Every day | ORAL | Status: DC
  Administered 2015-03-18: 200 mg via ORAL
  Filled 2015-03-18: qty 2

## 2015-03-18 MED ORDER — GABAPENTIN 100 MG PO CAPS
100.0000 mg | ORAL_CAPSULE | Freq: Three times a day (TID) | ORAL | Status: DC
  Administered 2015-03-18 (×2): 100 mg via ORAL
  Filled 2015-03-18 (×6): qty 1

## 2015-03-18 MED ORDER — HYOSCYAMINE SULFATE ER 0.375 MG PO TB12
0.3750 mg | ORAL_TABLET | Freq: Two times a day (BID) | ORAL | Status: DC
  Administered 2015-03-18 (×2): 0.375 mg via ORAL
  Filled 2015-03-18 (×4): qty 1

## 2015-03-18 MED ORDER — ALPRAZOLAM 0.5 MG PO TABS
2.0000 mg | ORAL_TABLET | Freq: Three times a day (TID) | ORAL | Status: DC | PRN
  Administered 2015-03-18: 2 mg via ORAL
  Filled 2015-03-18: qty 4

## 2015-03-18 MED ORDER — LAMOTRIGINE 100 MG PO TABS
200.0000 mg | ORAL_TABLET | Freq: Every day | ORAL | Status: DC
  Administered 2015-03-18: 200 mg via ORAL
  Filled 2015-03-18: qty 2

## 2015-03-18 MED ORDER — FOLIC ACID 1 MG PO TABS
1.0000 mg | ORAL_TABLET | Freq: Every day | ORAL | Status: DC
Start: 2015-03-18 — End: 2015-03-19
  Administered 2015-03-18: 1 mg via ORAL
  Filled 2015-03-18: qty 1

## 2015-03-18 MED ORDER — PROMETHAZINE HCL 25 MG PO TABS
25.0000 mg | ORAL_TABLET | Freq: Four times a day (QID) | ORAL | Status: DC | PRN
  Filled 2015-03-18: qty 1

## 2015-03-18 NOTE — ED Notes (Signed)
PT ASKING FOR PREDNISONE - ADVISED PT IT HAS BEEN D/C'D AT THIS TIME BY EDP AND PSYCH MD.

## 2015-03-18 NOTE — ED Notes (Signed)
Pt on with TTS.  

## 2015-03-18 NOTE — Progress Notes (Signed)
Referral was followed up at:  [facility] - per Pam, referral under review, patient seems appropriate but patient needs to take his medication. Pam states that she will look through the notes and call back with final decision.  [facility] - per intake, re-fax referral. Referral faxed.  CSW will continue to seek placement and follow up.  Melbourne Abts, LCSWA Disposition staff 03/18/2015 6:02 PM

## 2015-03-18 NOTE — ED Notes (Signed)
Pt on phone w/Vincent and writing mother's name on release of info form.

## 2015-03-18 NOTE — ED Notes (Signed)
PT AMBULATORY TO NURSES' DESK. PT ASKING FOR HIS PHONE. ADVISED PT UNABLE TO PROVIDE AND THAT HE IS ABLE TO MAKE A PHONE CALL FROM THE NURSES' DESK PHONE IF HE WOULD LIKE. PT NOTED TO BE UNABLE TO MAKE COMPLETE SENTENCES AND MUMBLES AT THE END WHEN HE TALKS. PT GIVEN PHONE NUMBER TO CALL HIS MOTHER - THEN STATES HE DOES NOT WANT TO CALL HER. STATES HE WANTS TO CALL OTHER PEOPLE BUT EITHER IS UNABLE OR UNWILLING TO GIVE THEIR NAMES. PT ASKED FOR PHONE BOOK REPEATEDLY. PT THEN ASKING TO SEE DR REPEATEDLY. RN ADVISED PT PSYCH MD SAW HIM A FEW HOURS AGO. PT STATES HE WANTS TO SEE HIM AGAIN. ADVISED PT UNABLE TO DO SO TODAY. PT THEN RETURNED TO HIS ROOM AND IS LYING ON BED.
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
## 2015-03-18 NOTE — ED Notes (Signed)
Dr Silverio Lay aware Dr Elsie Saas assessed pt and has changed/added meds.

## 2015-03-18 NOTE — ED Notes (Signed)
Pt given paper and crayon as requested so may draw and write. Pt standing in room writing on paper on bedside

## 2015-03-18 NOTE — ED Notes (Signed)
Sitter at bedside.

## 2015-03-18 NOTE — ED Notes (Signed)
MOTHER, VIRGINIA Leland, CALLED AND ADVISED SHE IS NO LONGER PT'S POA D/T PT'S WISHES. STATES THEY HAVE NOT TALKED IN 2 WEEKS.

## 2015-03-18 NOTE — Progress Notes (Addendum)
Patient accepted at [facility]. Patient needs to be IVC'd, per Pam at [facility]. Accepting Dr. Sara Chu, patient to come after 8am on 8/03. Call report at [phone].   RN Kriste Basque informed.  [name], LCSWA Disposition staff 03/18/2015 6:11 PM

## 2015-03-18 NOTE — ED Notes (Signed)
Pt's mother at the nurse's station at this time to collect the pt's belongings.

## 2015-03-18 NOTE — ED Provider Notes (Signed)
  Physical Exam  BP 111/77 mmHg  Pulse 77  Temp(Src) 98.3 F (36.8 C) (Oral)  Resp 12  SpO2 97%  Physical Exam  ED Course  Procedures  MDM Patient here with hallucinations. More agitated last night. Was suppose to be on chronic steroids but on review of his home meds, he is not on steroids. Dc steroids as it likely made hallucinations worse. Placed him on home meds. Psych to see patient.   Richardean Canal, MD 03/18/15 [phone]

## 2015-03-18 NOTE — ED Notes (Signed)
Breakfast tray ordered 

## 2015-03-18 NOTE — ED Notes (Addendum)
PT REFUSING TO STAY IN ROOM - STATES D/T TOO HOT. ROOM DOES NOT FEEL HOT TO STAFF, HOWEVER, TEMPERATURE WAS DECREASED FOR PT COMFORT AND RN ENCOURAGED PT TO DRINK COLD WATER GIVEN. PT INITIALLY REFUSING MEDS. RN HAD ADVISED PT DR JONNALAGADDA HAD INCREASED HIS EVENING DOSE OF [place] TO  FROM  AND HAD ORDERED HIS LAMICTAL. PT ASKING FOR Prudy Feeler - ADVISED PT NOT TIME FOR NEXT DOSE. PT FINALLY AGREED TO TAKE MED. RN ASKED PT TO NOT STEP OVER DOORWAY AND TO REMAIN IN ROOM. PT ADVISING HAVING DIFFICULT TIME W/THAT. RN ENCOURAGED PT TO PACE IN ROOM.

## 2015-03-18 NOTE — ED Notes (Signed)
Release of info form faxed to [facility]. Estrellita Ludwig, [facility], aware. She advised pt has been accepted to [facility] - pt will need to be IVC'd. Pt may be transported in am.
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
## 2015-03-18 NOTE — ED Notes (Signed)
Per Dr Silverio Lay, give pt Geodon and Xanax now.

## 2015-03-18 NOTE — ED Notes (Signed)
Pt's IVC papers have been served by [employer].

## 2015-03-18 NOTE — ED Notes (Addendum)
PT SIGNED RELEASE OF RECORDS FOR INFO TO BE GIVEN TO VINCENT GENNA. VINCENT ADVISED HE USED TO BE PT'S SW/PSYCHOTHERAPIST. ALSO ADVISED PT'S MOTHER IS HIS HEALTHCARE POA AND RECOMMENDS FOR HER TO BE CONTACTED. VINCENT ADVISED PT TOLD HIM HE HAS BEEN EVICTED FROM HIS APARTMENT AND WAS NOT SURE WHAT TO DO. ALSO ADVISED PT HAS "SEVERE IBD" AND THAT HE NEEDS TO HAVE SURGERY BUT UNABLE TO DO SO D/T "STAYS INFLAMED".

## 2015-03-18 NOTE — Progress Notes (Signed)
Patient's Consent for Release of Information was faxed to [facility]  Melbourne Abts, LCSWA Disposition staff 03/18/2015 8:39 PM

## 2015-03-18 NOTE — ED Notes (Signed)
IVC PAPERWORK GIVEN TO DR IPPGFQM TO COMPLETE D/T PT ACCEPTED TO [facility] AND IS PSYCHOTIC/MANIC - UNABLE TO CARE FOR HIMSELF AT THIS TIME.

## 2015-03-18 NOTE — BH Assessment (Signed)
Seeking inpt placement. Sent referrals to: [facility], [facility], [place], [place]    [place], [place] Triage Specialist 03/18/2015 2:23 AM

## 2015-03-18 NOTE — Progress Notes (Signed)
Patient seen with MD J. Patient continues to need inpatient admission to psychiatric hospital as patient experiencing acute mania and needs stabilization.  Deretha Emory, MSW Clinical Social Work: Emergency Room [phone]

## 2015-03-18 NOTE — ED Notes (Signed)
Dr Jonnalagadda in w/pt. 

## 2015-03-18 NOTE — ED Notes (Signed)
PT AMBULATORY TO NURSES' DESK ATTEMPTING TO MAKE A PHONE CALL. PT NOTED W/MANIA STILL PRESENT INCL RESTLESSNESS. PT ASKING FOR PHONE BOOK. ADVISED PT NO PHONE BOOK IS AVAILABLE AND THAT RN WILL LOOK UP PHONE NUMBER OF WHO PT WISHES TO CALL IF HE WILL PROVIDE THE NAME. PT ASKED FOR "VINCENT GENNA". ADVISED HE IS IN [place]. [phone]. ACCORDING TO ONLINE, HE IS A PSYCH PROVIDER. PT CALLED AND LEFT MESSAGE FOR HIM TO RETURN CALL. PT THEN CONCERNED WHEN HE CALLS BACK IF HE WILL BE ABLE TO SPEAK W/HIM. RN ASSURED PT IF HE RECEIVES A CALL, A STAFF MEMBER WILL LET HIM KNOW SO HE CAN SPEAK W/HIM. PT HAS RETURNED TO HIS ROOM AT THIS TIME.

## 2015-03-18 NOTE — Progress Notes (Signed)
Seeking inpatient placement for pt.  Referred to: [facility]- per [facility]- per [facility]- per [facility]- per [facility] (will not be reviewing referrals until 8/2 p.m. at earliest)  Left voicemail for [facility], will refer if appropriate. All other facilities contacted are not accepting referrals at this time.  Ilean Skill, MSW, LCSWA Clinical Social Work, Disposition  03/18/2015 [phone]

## 2015-03-18 NOTE — ED Notes (Signed)
Left message for pt's mother - [place] "Juandiego Arntzen - [phone] - for her to return call re: pt. Need to ask when she calls back if she is pt's healthcare POA and if so, copy needs to be brought to ED please.

## 2015-03-18 NOTE — ED Notes (Signed)
Pt on phone calling his mother.
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
## 2015-03-19 NOTE — ED Notes (Signed)
Pt in the shower. This RN gave the pt new scrubs and soap.

## 2015-03-19 NOTE — ED Notes (Signed)
Pt taken off unit with Sheriff.

## 2015-03-19 NOTE — ED Notes (Signed)
Pt is back in his room, sitting on bed. Sitter at bedside.

## 2015-03-19 NOTE — ED Notes (Signed)
Called Sheriff dept to arrange transportation to [facility] for Pt.

## 2015-03-19 NOTE — ED Notes (Addendum)
Pt wanted to speak to this RN about getting a lawyer. This RN told Clinical research associate pt that he was not under arrest, but pt insisted that he speak with a Clinical research associate. Pt asked for a phonebook so he could look up a lawyer and call one. This RN told the pt that she didn't have a phonebook or any way to get one. Pt told this RN that it is against the law to not have a phonebook.   Pt can go to over to [facility] after 0800 this AM. Report can be called to the unit at [phone]. Accepting physician is Tharwani.

## 2015-04-27 ENCOUNTER — Emergency Department (HOSPITAL_COMMUNITY)
Admission: EM | Admit: 2015-04-27 | Discharge: 2015-04-27 | Disposition: A | Discharge status: Home / Self Care | Payer: BLUE CROSS/BLUE SHIELD | Attending: Emergency Medicine | Admitting: Emergency Medicine

## 2015-04-27 ENCOUNTER — Encounter (HOSPITAL_COMMUNITY): Payer: Self-pay | Admitting: Emergency Medicine

## 2015-04-27 DIAGNOSIS — G4700 Insomnia, unspecified: Secondary | ICD-10-CM | POA: Insufficient documentation

## 2015-04-27 DIAGNOSIS — F419 Anxiety disorder, unspecified: Secondary | ICD-10-CM | POA: Insufficient documentation

## 2015-04-27 DIAGNOSIS — Z79899 Other long term (current) drug therapy: Secondary | ICD-10-CM | POA: Diagnosis not present

## 2015-04-27 DIAGNOSIS — L0501 Pilonidal cyst with abscess: Secondary | ICD-10-CM

## 2015-04-27 DIAGNOSIS — Z7952 Long term (current) use of systemic steroids: Secondary | ICD-10-CM | POA: Insufficient documentation

## 2015-04-27 DIAGNOSIS — M545 Low back pain: Secondary | ICD-10-CM | POA: Diagnosis not present

## 2015-04-27 DIAGNOSIS — L0502 Pilonidal sinus with abscess: Secondary | ICD-10-CM | POA: Insufficient documentation

## 2015-04-27 DIAGNOSIS — F319 Bipolar disorder, unspecified: Secondary | ICD-10-CM | POA: Diagnosis not present

## 2015-04-27 LAB — CBG MONITORING, ED: Glucose-Capillary: 110 mg/dL — ABNORMAL HIGH (ref 65–99)

## 2015-04-27 MED ORDER — SODIUM CHLORIDE 0.9 % IV BOLUS (SEPSIS): 1000.0000 mL | Freq: Once | INTRAVENOUS | Status: DC

## 2015-04-27 MED ORDER — SULFAMETHOXAZOLE-TRIMETHOPRIM 800-160 MG PO TABS: 1.0000 | ORAL_TABLET | Freq: Two times a day (BID) | ORAL | Status: AC

## 2015-04-27 MED ORDER — OXYCODONE-ACETAMINOPHEN 5-325 MG PO TABS: 1.0000 | ORAL_TABLET | Freq: Four times a day (QID) | ORAL | Status: DC | PRN

## 2015-04-27 MED ORDER — LIDOCAINE-EPINEPHRINE (PF) 2 %-1:200000 IJ SOLN
20.0000 mL | Freq: Once | INTRAMUSCULAR | Status: AC
  Administered 2015-04-27: 20 mL
  Filled 2015-04-27: qty 20

## 2015-04-27 MED ORDER — OXYCODONE-ACETAMINOPHEN 5-325 MG PO TABS
1.0000 | ORAL_TABLET | Freq: Once | ORAL | Status: AC
  Administered 2015-04-27: 1 via ORAL
  Filled 2015-04-27: qty 1

## 2015-04-27 MED ORDER — ONDANSETRON 4 MG PO TBDP
4.0000 mg | ORAL_TABLET | Freq: Once | ORAL | Status: AC
  Administered 2015-04-27: 4 mg via ORAL
  Filled 2015-04-27: qty 1

## 2015-04-27 NOTE — ED Notes (Signed)
Pt. Stated, I have an abscess on my lower back at the tailbone. Its been there for a week.

## 2015-04-27 NOTE — ED Notes (Signed)
Pt states he bent over and opened his abscess last night and brown drainage came out. Pt states he is still in pain.

## 2015-04-27 NOTE — Discharge Instructions (Signed)
Incision and Drainage °Incision and drainage is a procedure in which a sac-like structure (cystic structure) is opened and drained. The area to be drained usually contains material such as pus, fluid, or blood.  °LET YOUR CAREGIVER KNOW ABOUT:  °· Allergies to medicine. °· Medicines taken, including vitamins, herbs, eyedrops, over-the-counter medicines, and creams. °· Use of steroids (by mouth or creams). °· Previous problems with anesthetics or numbing medicines. °· History of bleeding problems or blood clots. °· Previous surgery. °· Other health problems, including diabetes and kidney problems. °· Possibility of pregnancy, if this applies. °RISKS AND COMPLICATIONS °· Pain. °· Bleeding. °· Scarring. °· Infection. °BEFORE THE PROCEDURE  °You may need to have an ultrasound or other imaging tests to see how large or deep your cystic structure is. Blood tests may also be used to determine if you have an infection or how severe the infection is. You may need to have a tetanus shot. °PROCEDURE  °The affected area is cleaned with a cleaning fluid. The cyst area will then be numbed with a medicine (local anesthetic). A small incision will be made in the cystic structure. A syringe or catheter may be used to drain the contents of the cystic structure, or the contents may be squeezed out. The area will then be flushed with a cleansing solution. After cleansing the area, it is often gently packed with a gauze or another wound dressing. Once it is packed, it will be covered with gauze and tape or some other type of wound dressing.  °AFTER THE PROCEDURE  °· Often, you will be allowed to go home right after the procedure. °· You may be given antibiotic medicine to prevent or heal an infection. °· If the area was packed with gauze or some other wound dressing, you will likely need to come back in 1 to 2 days to get it removed. °· The area should heal in about 14 days. °Document Released: 01/26/2001 Document Revised: 02/01/2012  Document Reviewed: 09/27/2011 °ExitCare® Patient Information ©2015 [employer]. This information is not intended to replace advice given to you by your health care provider. Make sure you discuss any questions you have with your health care provider. ° °Pilonidal Cyst °A pilonidal cyst occurs when hairs get trapped (ingrown) beneath the skin in the crease between the buttocks over your sacrum (the bone under that crease). Pilonidal cysts are most common in young men with a lot of body hair. When the cyst is ruptured (breaks) or leaking, fluid from the cyst may cause burning and itching. If the cyst becomes infected, it causes a painful swelling filled with pus (abscess). The pus and trapped hairs need to be removed (often by lancing) so that the infection can heal. However, recurrence is common and an operation may be needed to remove the cyst. °HOME CARE INSTRUCTIONS  °· If the cyst was NOT INFECTED: °¨ Keep the area clean and dry. Bathe or shower daily. Wash the area well with a germ-killing soap. Warm tub baths may help prevent infection and help with drainage. Dry the area well with a towel. °¨ Avoid tight clothing to keep area as moisture free as possible. °¨ Keep area between buttocks as free of hair as possible. A depilatory may be used. °· If the cyst WAS INFECTED and needed to be drained: °¨ Your caregiver packed the wound with gauze to keep the wound open. This allows the wound to heal from the inside outwards and continue draining. °¨ Return for a wound check   in 1 day or as suggested. °¨ If you take tub baths or showers, repack the wound with gauze following them. Sponge baths (at the sink) are a good alternative. °¨ If an antibiotic was ordered to fight the infection, take as directed. °¨ Only take over-the-counter or prescription medicines for pain, discomfort, or fever as directed by your caregiver. °¨ After the drain is removed, use sitz baths for 20 minutes 4 times per day. Clean the wound gently  with mild unscented soap, pat dry, and then apply a dry dressing. °SEEK MEDICAL CARE IF:  °· You have increased pain, swelling, redness, drainage, or bleeding from the area. °· You have a fever. °· You have muscles aches, dizziness, or a general ill feeling. °Document Released: 07/30/2000 Document Revised: 10/25/2011 Document Reviewed: 09/27/2008 °ExitCare® Patient Information ©2015 [employer]. This information is not intended to replace advice given to you by your health care provider. Make sure you discuss any questions you have with your health care provider. ° °

## 2015-04-27 NOTE — ED Provider Notes (Signed)
CSN: [id number]     Arrival date & time 04/27/15  0847 History   First MD Initiated Contact with Patient 04/27/15 1103     Chief Complaint  Patient presents with  . Abscess  . Tailbone Pain     (Consider location/radiation/quality/duration/timing/severity/associated sxs/prior Treatment) HPI   [name] 34 y.o.male  PCP: Sanda Linger, MD  Blood pressure 93/53, pulse 98, temperature 97.5 F (36.4 C), temperature source Oral, resp. rate 18, SpO2 97 %.  SIGNIFICANT PMH: anxiety, depression, bipolar, ADHD, insomnia, UColicits, IBS on  CHIEF COMPLAINT: pilonidal cyst  When: symptoms started a week ago g  How:  Symptoms have been  progressively getting worse, he bent over last night and the abscess drained on its own but he is stil having severe pain Chronicity: acute Location: pilonidal Radiation: none Quality and severity: severe, throbbing Alleviating factors: laying on his side Worsening factors: sitting and touching the area. movement Treatments tried: None Associated Symptoms: pain and drainage Negative ROS: Confusion, diaphoresis, fever, headache, weakness (general or focal), change of vision,  neck pain, dysphagia, aphagia, chest pain, shortness of breath,  back pain, abdominal pains, nausea, vomiting, diarrhea, lower extremity swelling.     Past Medical History  Diagnosis Date  . Anxiety   . Ulcerative colitis   . Bipolar 1 disorder   . Insomnia   . ADHD (attention deficit hyperactivity disorder)   . Depression   . Excessive weight loss   . IBS (irritable bowel syndrome)    Past Surgical History  Procedure Laterality Date  . Colonoscopy w/ biopsies      hx ulcerative colitis  . Anal fissurectomy     Family History  Problem Relation Age of Onset  . Lymphoma Paternal Grandmother   . Leukemia Maternal Grandmother   . Colon cancer Neg Hx    Social History  Substance Use Topics  . Smoking status: Never Smoker   . Smokeless tobacco: Never Used    . Alcohol Use: No    Review of Systems  10 Systems reviewed and are negative for acute change except as noted in the HPI.   Allergies  Cephalexin  Home Medications   Prior to Admission medications   Medication Sig Start Date End Date Taking? Authorizing Provider  ALPRAZolam Prudy Feeler) 1 MG tablet Take 2 mg by mouth 3 (three) times daily as needed for anxiety. 05/15/12  Yes Curlene Labrum Readling, MD  amphetamine-dextroamphetamine (ADDERALL) 20 MG tablet Take 20 mg by mouth 4 (four) times daily.   Yes Historical Provider, MD  azaTHIOprine (IMURAN) 50 MG tablet TAKE (3) TABLETS DAILY. 04/02/14  Yes Louis Meckel, MD  EPINEPHrine (EPIPEN) 0.3 mg/0.3 mL SOAJ injection Inject 0.3 mLs (0.3 mg total) into the muscle as needed. 09/16/13  Yes Lonia Skinner Sofia, PA-C  folic acid (FOLVITE) 1 MG tablet TAKE 1 TABLET EACH DAY. 04/02/14  Yes Louis Meckel, MD  gabapentin (NEURONTIN) 100 MG capsule Take 100 mg by mouth 3 (three) times daily. For anxiety   Yes Historical Provider, MD  glycopyrrolate (ROBINUL) 2 MG tablet Take 1 tablet (2 mg total) by mouth 2 (two) times daily. 03/20/14  Yes Iva Boop, MD  hyoscyamine (LEVBID) 0.375 MG 12 hr tablet Take 0.375 mg by mouth 2 (two) times daily.  02/22/14  Yes Historical Provider, MD  lamoTRIgine (LAMICTAL) 200 MG tablet Take 1 tablet (200 mg total) by mouth daily. For mood stabilization. 05/15/12  Yes Ronny Bacon, MD  LIALDA 1.2 G EC tablet  TAKE 4 TABLETS IN THE MORNING WITH BREAKFAST. 01/21/14  Yes Louis Meckel, MD  Multiple Vitamin (MULTIVITAMIN WITH MINERALS) TABS Take 1 tablet by mouth daily. As a nutritional supplement. 05/15/12  Yes Ronny Bacon, MD  predniSONE (DELTASONE) 20 MG tablet Take 40 mg for two weeks and then decrease to 30 mg once daily until follow up visit 02/26/14  Yes [name] S Esterwood, PA-C  Probiotic Product (VSL#3 DS) PACK Take 1 each by mouth 4 (four) times daily. 03/15/14  Yes Louis Meckel, MD  promethazine (PHENERGAN) 25 MG tablet  TAKE 1 TABLET EVERY 6 HOURS AS NEEDED FOR NAUSEA & VOMITING.   Yes Louis Meckel, MD  sodium chloride 0.9 % SOLN with inFLIXimab 100 MG SOLR Inject 5 mg/kg into the vein every 8 (eight) weeks.    Yes Historical Provider, MD  traZODone (DESYREL) 100 MG tablet Take 2 tablets (200 mg total) by mouth at bedtime as needed for sleep. 05/15/12  Yes Curlene Labrum Readling, MD  ziprasidone (GEODON) 40 MG capsule Take 40-80 mg by mouth 2 (two) times daily with a meal. Takes 80mg  in the morning and 40mg  in the evening   Yes Historical Provider, MD  zolpidem (AMBIEN) 10 MG tablet Take 10 mg by mouth at bedtime.    Yes Historical Provider, MD  oxyCODONE-acetaminophen (PERCOCET/ROXICET) 5-325 MG per tablet Take 1-2 tablets by mouth every 6 (six) hours as needed for severe pain. 04/27/15   Haidee Stogsdill Neva Seat, PA-C  sulfamethoxazole-trimethoprim (BACTRIM DS,SEPTRA DS) 800-160 MG per tablet Take 1 tablet by mouth 2 (two) times daily. 04/27/15 05/04/15  Alivia Cimino Neva Seat, PA-C   BP 93/53 mmHg  Pulse 98  Temp(Src) 97.5 F (36.4 C) (Oral)  Resp 18  SpO2 97% Physical Exam  Constitutional: He appears well-developed and well-nourished. No distress.  HENT:  Head: Normocephalic and atraumatic.  Eyes: Pupils are equal, round, and reactive to light.  Neck: Normal range of motion. Neck supple.  Cardiovascular: Normal rate and regular rhythm.   Pulmonary/Chest: Effort normal.  Abdominal: Soft.  Genitourinary:     Neurological: He is alert.  Skin: Skin is warm and dry.  Nursing note and vitals reviewed.   ED Course  Procedures (including critical care time) Labs Review Labs Reviewed  CBG MONITORING, ED - Abnormal; Notable for the following:    Glucose-Capillary 110 (*)    All other components within normal limits  CBG MONITORING, ED    Imaging Review No results found. I have personally reviewed and evaluated these images and lab results as part of my medical decision-making.   EKG Interpretation None      MDM    Final diagnoses:  Pilonidal cyst with abscess    Pt initially tachycardic but he is in pain. They had difficulty obtaining BP because he was laying on his side. Pulse is 98 with pain control. Pt does not appear sick and is not having any systemic symptoms.reports he will see his GI doctor for his UC  this week and will have them re-evaluate the abscess at that time.  Rx: Bactrim  Medications  oxyCODONE-acetaminophen (PERCOCET/ROXICET) 5-325 MG per tablet 1 tablet (1 tablet Oral Given 04/27/15 1155)  ondansetron (ZOFRAN-ODT) disintegrating tablet 4 mg (4 mg Oral Given 04/27/15 1155)  lidocaine-EPINEPHrine (XYLOCAINE W/EPI) 2 %-1:200000 (PF) injection 20 mL (20 mLs Other Given by Other 04/27/15 1155)    33 y.o.[name]'s evaluation in the Emergency Department is complete. It has been determined that no acute conditions requiring further  emergency intervention are present at this time. The patient/guardian have been advised of the diagnosis and plan. We have discussed signs and symptoms that warrant return to the ED, such as changes or worsening in symptoms.  Vital signs are stable at discharge. Filed Vitals:   04/27/15 1248  BP: 98/58  Pulse:   Temp: 98 F (36.7 C)  Resp: 16    Patient/guardian has voiced understanding and agreed to follow-up with the PCP or specialist.     Marlon Pel, PA-C 04/27/15 1253  Lyndal Pulley, MD 04/27/15 2108

## 2015-04-27 NOTE — ED Notes (Signed)
Pt is lying on his side with his right arm in the air. Pt is unable to lay flat to get an accurate BP. Once pt repositioned his arm his BP did come up. PA notified. Pt is stable.

## 2015-05-19 ENCOUNTER — Other Ambulatory Visit (HOSPITAL_COMMUNITY): Payer: Self-pay | Admitting: *Deleted

## 2015-05-20 ENCOUNTER — Encounter (HOSPITAL_COMMUNITY)
Admission: RE | Admit: 2015-05-20 | Discharge: 2015-05-20 | Disposition: A | Discharge status: Home / Self Care | Payer: Medicaid Other | Source: Ambulatory Visit | Attending: Student | Admitting: Student

## 2015-05-20 DIAGNOSIS — K51919 Ulcerative colitis, unspecified with unspecified complications: Secondary | ICD-10-CM | POA: Insufficient documentation

## 2015-05-20 LAB — CBC
HEMATOCRIT: 37.2 % — AB (ref 39.0–52.0)
HEMOGLOBIN: 12.2 g/dL — AB (ref 13.0–17.0)
MCH: 31.4 pg (ref 26.0–34.0)
MCHC: 32.8 g/dL (ref 30.0–36.0)
MCV: 95.6 fL (ref 78.0–100.0)
Platelets: 255 10*3/uL (ref 150–400)
RBC: 3.89 MIL/uL — AB (ref 4.22–5.81)
RDW: 14.8 % (ref 11.5–15.5)
WBC: 8.2 10*3/uL (ref 4.0–10.5)

## 2015-05-20 LAB — AST: AST: 24 U/L (ref 15–41)

## 2015-05-20 LAB — ALT: ALT: 16 U/L — ABNORMAL LOW (ref 17–63)

## 2015-05-20 LAB — C-REACTIVE PROTEIN: CRP: <0.5 mg/dL (ref <1.0)

## 2015-05-20 MED ORDER — VEDOLIZUMAB 300 MG IV SOLR
300.0000 mg | INTRAVENOUS | Status: DC
  Administered 2015-05-20: 300 mg via INTRAVENOUS
  Filled 2015-05-20: qty 5

## 2015-05-20 MED ORDER — ACETAMINOPHEN 325 MG PO TABS
ORAL_TABLET | ORAL | Status: AC
  Administered 2015-05-20: 650 mg via ORAL
  Filled 2015-05-20: qty 2

## 2015-05-20 MED ORDER — LORATADINE 10 MG PO TABS
10.0000 mg | ORAL_TABLET | Freq: Every day | ORAL | Status: DC
  Administered 2015-05-20: 10 mg via ORAL

## 2015-05-20 MED ORDER — METHYLPREDNISOLONE SODIUM SUCC 40 MG IJ SOLR
INTRAMUSCULAR | Status: AC
  Administered 2015-05-20: 20 mg via INTRAVENOUS
  Filled 2015-05-20: qty 1

## 2015-05-20 MED ORDER — ACETAMINOPHEN 325 MG PO TABS
650.0000 mg | ORAL_TABLET | ORAL | Status: DC
  Administered 2015-05-20: 650 mg via ORAL

## 2015-05-20 MED ORDER — METHYLPREDNISOLONE SODIUM SUCC 40 MG IJ SOLR
20.0000 mg | INTRAMUSCULAR | Status: DC
  Administered 2015-05-20: 20 mg via INTRAVENOUS

## 2015-05-20 MED ORDER — LORATADINE 10 MG PO TABS
ORAL_TABLET | ORAL | Status: AC
  Administered 2015-05-20: 10 mg via ORAL
  Filled 2015-05-20: qty 1

## 2015-05-20 MED ORDER — SODIUM CHLORIDE 0.9 % IV SOLN
INTRAVENOUS | Status: DC
Start: 2015-05-20 — End: 2015-05-21
  Administered 2015-05-20: 13:00:00 via INTRAVENOUS

## 2015-09-11 ENCOUNTER — Emergency Department (HOSPITAL_COMMUNITY): Payer: Medicaid Other

## 2015-09-11 ENCOUNTER — Emergency Department (HOSPITAL_COMMUNITY)
Admission: EM | Admit: 2015-09-11 | Discharge: 2015-09-12 | Payer: Medicaid Other | Attending: Emergency Medicine | Admitting: Emergency Medicine

## 2015-09-11 ENCOUNTER — Encounter (HOSPITAL_COMMUNITY): Payer: Self-pay

## 2015-09-11 DIAGNOSIS — A419 Sepsis, unspecified organism: Secondary | ICD-10-CM | POA: Insufficient documentation

## 2015-09-11 DIAGNOSIS — Y658 Other specified misadventures during surgical and medical care: Secondary | ICD-10-CM | POA: Diagnosis not present

## 2015-09-11 DIAGNOSIS — Z7952 Long term (current) use of systemic steroids: Secondary | ICD-10-CM | POA: Diagnosis not present

## 2015-09-11 DIAGNOSIS — Z9049 Acquired absence of other specified parts of digestive tract: Secondary | ICD-10-CM | POA: Insufficient documentation

## 2015-09-11 DIAGNOSIS — R1084 Generalized abdominal pain: Secondary | ICD-10-CM | POA: Diagnosis present

## 2015-09-11 DIAGNOSIS — T814XXA Infection following a procedure, initial encounter: Secondary | ICD-10-CM | POA: Insufficient documentation

## 2015-09-11 DIAGNOSIS — F909 Attention-deficit hyperactivity disorder, unspecified type: Secondary | ICD-10-CM | POA: Insufficient documentation

## 2015-09-11 DIAGNOSIS — K589 Irritable bowel syndrome without diarrhea: Secondary | ICD-10-CM | POA: Diagnosis not present

## 2015-09-11 DIAGNOSIS — G4700 Insomnia, unspecified: Secondary | ICD-10-CM | POA: Diagnosis not present

## 2015-09-11 DIAGNOSIS — K5660 Unspecified intestinal obstruction: Secondary | ICD-10-CM | POA: Diagnosis not present

## 2015-09-11 DIAGNOSIS — Z79899 Other long term (current) drug therapy: Secondary | ICD-10-CM | POA: Diagnosis not present

## 2015-09-11 DIAGNOSIS — F419 Anxiety disorder, unspecified: Secondary | ICD-10-CM | POA: Insufficient documentation

## 2015-09-11 DIAGNOSIS — F319 Bipolar disorder, unspecified: Secondary | ICD-10-CM | POA: Insufficient documentation

## 2015-09-11 DIAGNOSIS — K56609 Unspecified intestinal obstruction, unspecified as to partial versus complete obstruction: Secondary | ICD-10-CM

## 2015-09-11 DIAGNOSIS — R6520 Severe sepsis without septic shock: Secondary | ICD-10-CM | POA: Insufficient documentation

## 2015-09-11 DIAGNOSIS — R000 Tachycardia, unspecified: Secondary | ICD-10-CM | POA: Diagnosis not present

## 2015-09-11 DIAGNOSIS — IMO0002 Reserved for concepts with insufficient information to code with codable children: Secondary | ICD-10-CM

## 2015-09-11 LAB — COMPREHENSIVE METABOLIC PANEL
ALK PHOS: 130 U/L — AB (ref 38–126)
ALT: 28 U/L (ref 17–63)
ANION GAP: 17 — AB (ref 5–15)
AST: 19 U/L (ref 15–41)
Albumin: 3.6 g/dL (ref 3.5–5.0)
BUN: 17 mg/dL (ref 6–20)
CALCIUM: 9.7 mg/dL (ref 8.9–10.3)
CHLORIDE: 89 mmol/L — AB (ref 101–111)
CO2: 26 mmol/L (ref 22–32)
Creatinine, Ser: 1.22 mg/dL (ref 0.61–1.24)
GFR calc non Af Amer: >60 mL/min (ref >60)
Glucose, Bld: 165 mg/dL — ABNORMAL HIGH (ref 65–99)
Potassium: 4.3 mmol/L (ref 3.5–5.1)
SODIUM: 132 mmol/L — AB (ref 135–145)
Total Bilirubin: 0.4 mg/dL (ref 0.3–1.2)
Total Protein: 8.2 g/dL — ABNORMAL HIGH (ref 6.5–8.1)

## 2015-09-11 LAB — CBC
HCT: 39.5 % (ref 39.0–52.0)
HEMOGLOBIN: 13.5 g/dL (ref 13.0–17.0)
MCH: 31 pg (ref 26.0–34.0)
MCHC: 34.2 g/dL (ref 30.0–36.0)
MCV: 90.6 fL (ref 78.0–100.0)
Platelets: 584 10*3/uL — ABNORMAL HIGH (ref 150–400)
RBC: 4.36 MIL/uL (ref 4.22–5.81)
RDW: 13.6 % (ref 11.5–15.5)
WBC: 16.7 10*3/uL — ABNORMAL HIGH (ref 4.0–10.5)

## 2015-09-11 LAB — LIPASE, BLOOD: Lipase: 29 U/L (ref 11–51)

## 2015-09-11 LAB — I-STAT CG4 LACTIC ACID, ED: LACTIC ACID, VENOUS: 2.48 mmol/L — AB (ref 0.5–2.0)

## 2015-09-11 MED ORDER — IOHEXOL 300 MG/ML  SOLN
25.0000 mL | Freq: Once | INTRAMUSCULAR | Status: DC | PRN
  Administered 2015-09-11: 25 mL via ORAL

## 2015-09-11 MED ORDER — IOHEXOL 300 MG/ML  SOLN
100.0000 mL | Freq: Once | INTRAMUSCULAR | Status: AC | PRN
  Administered 2015-09-11: 100 mL via INTRAVENOUS

## 2015-09-11 MED ORDER — PIPERACILLIN-TAZOBACTAM 3.375 G IVPB 30 MIN
3.3750 g | Freq: Once | INTRAVENOUS | Status: AC
  Administered 2015-09-11: 3.375 g via INTRAVENOUS
  Filled 2015-09-11: qty 50

## 2015-09-11 MED ORDER — SODIUM CHLORIDE 0.9 % IV BOLUS (SEPSIS)
1000.0000 mL | INTRAVENOUS | Status: AC
  Administered 2015-09-11: 1000 mL via INTRAVENOUS

## 2015-09-11 NOTE — Progress Notes (Signed)
Pharmacy Code Sepsis Protocol  Time of code sepsis page: 2218  Antibiotics delivered at 2224  Were antibiotics ordered at the time of the code sepsis page? No Was it required to contact the physician?  Physician not contacted  Physician contacted to order antibiotics for code sepsis - Zosyn  Physician contacted to recommend changing antibiotics  Fredrik Rigger, PharmD 09/11/2015, 10:26 PM

## 2015-09-11 NOTE — ED Notes (Signed)
Bladder scan reveals > 

## 2015-09-11 NOTE — ED Notes (Signed)
Pt was in [facility] for 32 days for bowel resection and got a colostomy. Was discharged Tuesday and it was putting out okay. Today has not been draining. Has been having abd pain.

## 2015-09-11 NOTE — ED Notes (Signed)
Patient transported to CT 

## 2015-09-12 DIAGNOSIS — Z79899 Other long term (current) drug therapy: Secondary | ICD-10-CM | POA: Diagnosis not present

## 2015-09-12 DIAGNOSIS — T814XXA Infection following a procedure, initial encounter: Secondary | ICD-10-CM | POA: Diagnosis not present

## 2015-09-12 DIAGNOSIS — F319 Bipolar disorder, unspecified: Secondary | ICD-10-CM | POA: Diagnosis not present

## 2015-09-12 DIAGNOSIS — R6520 Severe sepsis without septic shock: Secondary | ICD-10-CM | POA: Diagnosis not present

## 2015-09-12 DIAGNOSIS — R1084 Generalized abdominal pain: Secondary | ICD-10-CM | POA: Diagnosis present

## 2015-09-12 DIAGNOSIS — Z9049 Acquired absence of other specified parts of digestive tract: Secondary | ICD-10-CM | POA: Diagnosis not present

## 2015-09-12 DIAGNOSIS — K5660 Unspecified intestinal obstruction: Secondary | ICD-10-CM | POA: Diagnosis not present

## 2015-09-12 DIAGNOSIS — G4700 Insomnia, unspecified: Secondary | ICD-10-CM | POA: Diagnosis not present

## 2015-09-12 DIAGNOSIS — R000 Tachycardia, unspecified: Secondary | ICD-10-CM | POA: Diagnosis not present

## 2015-09-12 DIAGNOSIS — K589 Irritable bowel syndrome without diarrhea: Secondary | ICD-10-CM | POA: Diagnosis not present

## 2015-09-12 DIAGNOSIS — R109 Unspecified abdominal pain: Secondary | ICD-10-CM | POA: Insufficient documentation

## 2015-09-12 DIAGNOSIS — F419 Anxiety disorder, unspecified: Secondary | ICD-10-CM | POA: Diagnosis not present

## 2015-09-12 DIAGNOSIS — Z7952 Long term (current) use of systemic steroids: Secondary | ICD-10-CM | POA: Diagnosis not present

## 2015-09-12 DIAGNOSIS — A419 Sepsis, unspecified organism: Secondary | ICD-10-CM | POA: Diagnosis not present

## 2015-09-12 DIAGNOSIS — F909 Attention-deficit hyperactivity disorder, unspecified type: Secondary | ICD-10-CM | POA: Diagnosis not present

## 2015-09-12 DIAGNOSIS — Y658 Other specified misadventures during surgical and medical care: Secondary | ICD-10-CM | POA: Diagnosis not present

## 2015-09-12 LAB — URINALYSIS, ROUTINE W REFLEX MICROSCOPIC
BILIRUBIN URINE: NEGATIVE
Glucose, UA: NEGATIVE mg/dL
HGB URINE DIPSTICK: NEGATIVE
Ketones, ur: NEGATIVE mg/dL
Leukocytes, UA: NEGATIVE
Nitrite: NEGATIVE
PH: 6.5 (ref 5.0–8.0)
Protein, ur: NEGATIVE mg/dL
SPECIFIC GRAVITY, URINE: 1.017 (ref 1.005–1.030)

## 2015-09-12 MED ORDER — LACTATED RINGERS IV BOLUS (SEPSIS)
2000.0000 mL | Freq: Once | INTRAVENOUS | Status: AC
  Administered 2015-09-12: 2000 mL via INTRAVENOUS

## 2015-09-12 NOTE — ED Provider Notes (Signed)
CSN: [id number]     Arrival date & time 09/11/15  2123 History   First MD Initiated Contact with Patient 09/11/15 2142     Chief Complaint  Patient presents with  . Abdominal Pain  . colostomy not putting out      (Consider location/radiation/quality/duration/timing/severity/associated sxs/prior Treatment) Patient is a 35 y.o. male presenting with abdominal pain. The history is provided by the patient.  Abdominal Pain Pain location:  Generalized Pain quality: aching   Pain radiates to:  Does not radiate Pain severity:  Moderate Onset quality:  Gradual Duration:  2 days Timing:  Constant Progression:  Worsening Chronicity:  New Context: previous surgery (total colectomy and end-ileostomy, )   Relieved by:  Nothing Worsened by:  Nothing tried Ineffective treatments:  None tried Associated symptoms: anorexia, chills, constipation (decreased output from ostomy site) and nausea   Associated symptoms: no hematochezia and no hematuria   Risk factors: multiple surgeries     Past Medical History  Diagnosis Date  . Anxiety   . Ulcerative colitis   . Bipolar 1 disorder (HCC)   . Insomnia   . ADHD (attention deficit hyperactivity disorder)   . Depression   . Excessive weight loss   . IBS (irritable bowel syndrome)    Past Surgical History  Procedure Laterality Date  . Colonoscopy w/ biopsies      hx ulcerative colitis  . Anal fissurectomy     Family History  Problem Relation Age of Onset  . Lymphoma Paternal Grandmother   . Leukemia Maternal Grandmother   . Colon cancer Neg Hx    Social History  Substance Use Topics  . Smoking status: Never Smoker   . Smokeless tobacco: Never Used  . Alcohol Use: No    Review of Systems  Constitutional: Positive for chills.  Gastrointestinal: Positive for nausea, abdominal pain, constipation (decreased output from ostomy site) and anorexia. Negative for hematochezia.  Genitourinary: Negative for hematuria.  All other systems  reviewed and are negative.     Allergies  Cephalexin  Home Medications   Prior to Admission medications   Medication Sig Start Date End Date Taking? Authorizing Provider  ALPRAZolam Prudy Feeler) 1 MG tablet Take 2 mg by mouth 3 (three) times daily as needed for anxiety. 05/15/12   Curlene Labrum Readling, MD  amphetamine-dextroamphetamine (ADDERALL) 20 MG tablet Take 20 mg by mouth 4 (four) times daily.    Historical Provider, MD  azaTHIOprine (IMURAN) 50 MG tablet TAKE (3) TABLETS DAILY. 04/02/14   Louis Meckel, MD  EPINEPHrine (EPIPEN) 0.3 mg/0.3 mL SOAJ injection Inject 0.3 mLs (0.3 mg total) into the muscle as needed. 09/16/13   Elson Areas, PA-C  folic acid (FOLVITE) 1 MG tablet TAKE 1 TABLET EACH DAY. 04/02/14   Louis Meckel, MD  gabapentin (NEURONTIN) 100 MG capsule Take 100 mg by mouth 3 (three) times daily. For anxiety    Historical Provider, MD  glycopyrrolate (ROBINUL) 2 MG tablet Take 1 tablet (2 mg total) by mouth 2 (two) times daily. 03/20/14   Iva Boop, MD  hyoscyamine (LEVBID) 0.375 MG 12 hr tablet Take 0.375 mg by mouth 2 (two) times daily.  02/22/14   Historical Provider, MD  lamoTRIgine (LAMICTAL) 200 MG tablet Take 1 tablet (200 mg total) by mouth daily. For mood stabilization. 05/15/12   Ronny Bacon, MD  LIALDA 1.2 G EC tablet TAKE 4 TABLETS IN THE MORNING WITH BREAKFAST. 01/21/14   Louis Meckel, MD  Multiple Vitamin (MULTIVITAMIN  WITH MINERALS) TABS Take 1 tablet by mouth daily. As a nutritional supplement. 05/15/12   Ronny Bacon, MD  oxyCODONE-acetaminophen (PERCOCET/ROXICET) 5-325 MG per tablet Take 1-2 tablets by mouth every 6 (six) hours as needed for severe pain. 04/27/15   Tiffany Neva Seat, PA-C  predniSONE (DELTASONE) 20 MG tablet Take 40 mg for two weeks and then decrease to 30 mg once daily until follow up visit 02/26/14   [name] S Esterwood, PA-C  Probiotic Product (VSL#3 DS) PACK Take 1 each by mouth 4 (four) times daily. 03/15/14   Louis Meckel, MD   promethazine (PHENERGAN) 25 MG tablet TAKE 1 TABLET EVERY 6 HOURS AS NEEDED FOR NAUSEA & VOMITING.    Louis Meckel, MD  sodium chloride 0.9 % SOLN with inFLIXimab 100 MG SOLR Inject 5 mg/kg into the vein every 8 (eight) weeks.     Historical Provider, MD  traZODone (DESYREL) 100 MG tablet Take 2 tablets (200 mg total) by mouth at bedtime as needed for sleep. 05/15/12   Curlene Labrum Readling, MD  ziprasidone (GEODON) 40 MG capsule Take 40-80 mg by mouth 2 (two) times daily with a meal. Takes  in the morning and  in the evening    Historical Provider, MD  zolpidem (AMBIEN) 10 MG tablet Take 10 mg by mouth at bedtime.     Historical Provider, MD   BP 97/77 mmHg  Pulse 110  Temp(Src) 99.9 F (37.7 C) (Rectal)  Resp 13  Ht  (1.88 m)  Wt 126 lb (57.153 kg)  BMI 16.17 kg/m2  SpO2 97% Physical Exam  Constitutional: He is oriented to person, place, and time. He appears well-developed. He appears listless. He appears ill. No distress.  HENT:  Head: Normocephalic and atraumatic.  Eyes: Conjunctivae are normal.  Neck: Neck supple. No tracheal deviation present.  Cardiovascular: Regular rhythm and normal heart sounds.  Tachycardia present.   Pulmonary/Chest: Effort normal and breath sounds normal. No respiratory distress.  Abdominal: He exhibits distension. There is tenderness (Diffuse). There is rigidity and guarding. There is no rebound, no tenderness at McBurney's point and negative Murphy's sign.  Neurological: He is oriented to person, place, and time. He appears listless. GCS eye subscore is 4. GCS verbal subscore is 5. GCS motor subscore is 6.  Skin: Skin is warm. He is diaphoretic.  Psychiatric: He has a normal mood and affect.    ED Course  Procedures (including critical care time) Labs Review Labs Reviewed  COMPREHENSIVE METABOLIC PANEL - Abnormal; Notable for the following:    Sodium 132 (*)    Chloride 89 (*)    Glucose, Bld 165 (*)    Total Protein 8.2 (*)     Alkaline Phosphatase 130 (*)    Anion gap 17 (*)    All other components within normal limits  CBC - Abnormal; Notable for the following:    WBC 16.7 (*)    Platelets 584 (*)    All other components within normal limits  I-STAT CG4 LACTIC ACID, ED - Abnormal; Notable for the following:    Lactic Acid, Venous 2.48 (*)    All other components within normal limits  CULTURE, BLOOD (ROUTINE X 2)  CULTURE, BLOOD (ROUTINE X 2)  URINE CULTURE  LIPASE, BLOOD  URINALYSIS, ROUTINE W REFLEX MICROSCOPIC (NOT AT [facility])    Imaging Review Ct Abdomen Pelvis W Contrast  09/12/2015  CLINICAL DATA:  History of ulcerative colitis. Status post bowel resection 09/09/2015. Right buttock drain. Initial encounter.  EXAM: CT ABDOMEN AND PELVIS WITH CONTRAST TECHNIQUE: Multidetector CT imaging of the abdomen and pelvis was performed using the standard protocol following bolus administration of intravenous contrast. CONTRAST:  100 mL OMNIPAQUE IOHEXOL 300 MG/ML  SOLN COMPARISON:  CT abdomen and pelvis 12/28/2007. FINDINGS: Mild dependent atelectasis is seen lung bases. No pleural or pericardial effusion. 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 The distal ileum to the ostomy is completely decompressed and tracks into markedly dilated loops of small bowel. Transition point is difficult to localize but may be in the pelvis on image 86 where there is a markedly angulated loop of bowel. No pneumatosis, portal venous gas or free intraperitoneal air is identified. A pigtail catheter is seen along the right side of the rectum. A small fluid collection tracks cephalad and to the left adjacent to the catheter. The collection measures approximately scratch the collection measures up to 2.1 x 1.4 cm in the axial plane by approximately 2.9 cm craniocaudal is worse with residual abscess. Note is made that there is air within the urinary bladder. The gallbladder, the liver, spleen, adrenal glands,  kidneys and pancreas appear normal. No focal bony abnormality is identified. IMPRESSION: Extensive postoperative change in the abdomen within and ileostomy as described above. Diffuse dilatation of small bowel is worrisome for obstruction just proximal to the ostomy. Likely small residual abscess in the pelvis. Air within the urinary bladder. If the patient has not been catheterized, this is worrisome for fistula although the official seen on the study. Electronically Signed   By: Drusilla Kanner M.D.   On: 09/12/2015 00:22   Dg Chest Port 1 View  09/11/2015  CLINICAL DATA:  Recent bowel resection and colostomy at [facility]. Discharged on Tuesday. Today the colostomy is not draining and patient is having abdominal pain. EXAM: PORTABLE CHEST 1 VIEW COMPARISON:  None. FINDINGS: Normal heart size and pulmonary vascularity. No focal airspace disease or consolidation in the lungs. No blunting of costophrenic angles. No pneumothorax. Mediastinal contours appear intact. There is gas in the upper abdomen which may be within dilated bowel loops. Free air is not excluded. Suggest obtaining abdominal supine and upright films. IMPRESSION: No evidence of active pulmonary disease. Nonspecific gas demonstrated in the upper abdomen could represent distended bowel or free air. Abdominal series recommended. Electronically Signed   By: Burman Nieves M.D.   On: 09/11/2015 22:39   I have personally reviewed and evaluated these images and lab results as part of my medical decision-making.   EKG Interpretation   Date/Time:  Thursday September 11 2015 22:11:43 EST Ventricular Rate:  123 PR Interval:  140 QRS Duration: 87 QT Interval:  336 QTC Calculation: 481 R Axis:   76 Text Interpretation:  Sinus tachycardia Minimal ST elevation, inferior  leads Borderline prolonged QT interval Since last tracing rate faster  Confirmed by Darthy Manganelli MD, Ad Guttman ([id number]) on 09/11/2015 11:23:33 PM      MDM   Final diagnoses:  Severe  sepsis (HCC)  S/P colectomy  Abdominal abscess (HCC)  SBO (small bowel obstruction) (HCC)    35 y.o. male status post recent colectomy and end ileostomy presents with abdominal pain, decreased output from ileostomy site, chills and sweatiness. On arrival patient is severely tachycardic from 120s to 150s, running low-grade temperature with elevation of white blood cell count is 16 concerning for postoperative infection or complication. Evidence of end organ damage lactic acidosis so aggressive fluid resuscitation was initiated for severe sepsis.  Given empiric Zosyn for presumed abdominal source. No evidence of pneumonia or urinary source. CT scan of abdomen and pelvis with contrast demonstrates refractory abscess as potential source of infection, correlating multiple loops of small bowel concerning for ongoing obstruction that is just proximal to the ileostomy site. I discussed with Dr. Sherryll Burger of general surgery at [facility] who is covering the patient's primary surgeon Koruda's service. She recommended transfer ED to ED for evaluation of patient. Accepted to emergency department by Dr. Cyril Mourning. Patient currently appears stable for transfer after 2 L of normal saline, 2 L of lactated Ringer's, and empiric antibiotics.    Lyndal Pulley, MD 09/12/15 [phone]

## 2015-09-13 LAB — URINE CULTURE: CULTURE: NO GROWTH

## 2015-09-16 LAB — CULTURE, BLOOD (ROUTINE X 2)
Culture: NO GROWTH
Culture: NO GROWTH

## 2015-10-27 DIAGNOSIS — Z7409 Other reduced mobility: Secondary | ICD-10-CM | POA: Insufficient documentation

## 2015-11-18 ENCOUNTER — Emergency Department (HOSPITAL_COMMUNITY): Payer: Medicaid Other

## 2015-11-18 ENCOUNTER — Inpatient Hospital Stay (HOSPITAL_COMMUNITY): Payer: Medicaid Other

## 2015-11-18 ENCOUNTER — Encounter (HOSPITAL_COMMUNITY): Payer: Self-pay

## 2015-11-18 ENCOUNTER — Inpatient Hospital Stay (HOSPITAL_COMMUNITY)
Admission: EM | Admit: 2015-11-18 | Discharge: 2015-11-19 | DRG: 314 | Discharge status: Against Medical Advice | Payer: Medicaid Other | Attending: Internal Medicine | Admitting: Internal Medicine

## 2015-11-18 DIAGNOSIS — E860 Dehydration: Secondary | ICD-10-CM | POA: Diagnosis present

## 2015-11-18 DIAGNOSIS — F419 Anxiety disorder, unspecified: Secondary | ICD-10-CM | POA: Diagnosis present

## 2015-11-18 DIAGNOSIS — F319 Bipolar disorder, unspecified: Secondary | ICD-10-CM | POA: Diagnosis present

## 2015-11-18 DIAGNOSIS — K5190 Ulcerative colitis, unspecified, without complications: Secondary | ICD-10-CM | POA: Diagnosis present

## 2015-11-18 DIAGNOSIS — G9340 Encephalopathy, unspecified: Secondary | ICD-10-CM

## 2015-11-18 DIAGNOSIS — R4701 Aphasia: Secondary | ICD-10-CM | POA: Diagnosis present

## 2015-11-18 DIAGNOSIS — Z79899 Other long term (current) drug therapy: Secondary | ICD-10-CM

## 2015-11-18 DIAGNOSIS — R339 Retention of urine, unspecified: Secondary | ICD-10-CM | POA: Diagnosis present

## 2015-11-18 DIAGNOSIS — I959 Hypotension, unspecified: Principal | ICD-10-CM | POA: Diagnosis present

## 2015-11-18 DIAGNOSIS — I951 Orthostatic hypotension: Secondary | ICD-10-CM | POA: Diagnosis present

## 2015-11-18 DIAGNOSIS — T50905A Adverse effect of unspecified drugs, medicaments and biological substances, initial encounter: Secondary | ICD-10-CM | POA: Diagnosis present

## 2015-11-18 DIAGNOSIS — R5383 Other fatigue: Secondary | ICD-10-CM

## 2015-11-18 DIAGNOSIS — R5381 Other malaise: Secondary | ICD-10-CM | POA: Diagnosis present

## 2015-11-18 DIAGNOSIS — F909 Attention-deficit hyperactivity disorder, unspecified type: Secondary | ICD-10-CM | POA: Diagnosis present

## 2015-11-18 DIAGNOSIS — E876 Hypokalemia: Secondary | ICD-10-CM | POA: Diagnosis present

## 2015-11-18 DIAGNOSIS — Z7952 Long term (current) use of systemic steroids: Secondary | ICD-10-CM | POA: Diagnosis not present

## 2015-11-18 DIAGNOSIS — G92 Toxic encephalopathy: Secondary | ICD-10-CM | POA: Diagnosis present

## 2015-11-18 DIAGNOSIS — F411 Generalized anxiety disorder: Secondary | ICD-10-CM | POA: Diagnosis present

## 2015-11-18 DIAGNOSIS — F329 Major depressive disorder, single episode, unspecified: Secondary | ICD-10-CM | POA: Diagnosis present

## 2015-11-18 DIAGNOSIS — R401 Stupor: Secondary | ICD-10-CM

## 2015-11-18 HISTORY — DX: Encephalopathy, unspecified: G93.40

## 2015-11-18 LAB — I-STAT ARTERIAL BLOOD GAS, ED
Acid-base deficit: 3 mmol/L — ABNORMAL HIGH (ref 0.0–2.0)
Bicarbonate: 22.5 mEq/L (ref 20.0–24.0)
O2 Saturation: 98 %
PCO2 ART: 40.4 mmHg (ref 35.0–45.0)
PH ART: 7.354 (ref 7.350–7.450)
PO2 ART: 105 mmHg — AB (ref 80.0–100.0)
Patient temperature: 98.6
TCO2: 24 mmol/L (ref 0–100)

## 2015-11-18 LAB — CBC WITH DIFFERENTIAL/PLATELET
BASOS PCT: 0 %
Basophils Absolute: 0 10*3/uL (ref 0.0–0.1)
EOS ABS: 0.1 10*3/uL (ref 0.0–0.7)
Eosinophils Relative: 2 %
HCT: 35.9 % — ABNORMAL LOW (ref 39.0–52.0)
Hemoglobin: 11.6 g/dL — ABNORMAL LOW (ref 13.0–17.0)
Lymphocytes Relative: 27 %
Lymphs Abs: 1.4 10*3/uL (ref 0.7–4.0)
MCH: 28.4 pg (ref 26.0–34.0)
MCHC: 32.3 g/dL (ref 30.0–36.0)
MCV: 88 fL (ref 78.0–100.0)
MONO ABS: 0.5 10*3/uL (ref 0.1–1.0)
MONOS PCT: 9 %
Neutro Abs: 3.2 10*3/uL (ref 1.7–7.7)
Neutrophils Relative %: 62 %
PLATELETS: 304 10*3/uL (ref 150–400)
RBC: 4.08 MIL/uL — ABNORMAL LOW (ref 4.22–5.81)
RDW: 13.9 % (ref 11.5–15.5)
WBC: 5.3 10*3/uL (ref 4.0–10.5)

## 2015-11-18 LAB — COMPREHENSIVE METABOLIC PANEL
ALBUMIN: 3.4 g/dL — AB (ref 3.5–5.0)
ALK PHOS: 55 U/L (ref 38–126)
ALT: 11 U/L — AB (ref 17–63)
AST: 14 U/L — ABNORMAL LOW (ref 15–41)
Anion gap: 10 (ref 5–15)
BILIRUBIN TOTAL: 0.9 mg/dL (ref 0.3–1.2)
BUN: 5 mg/dL — ABNORMAL LOW (ref 6–20)
CALCIUM: 8.7 mg/dL — AB (ref 8.9–10.3)
CO2: 24 mmol/L (ref 22–32)
CREATININE: 1.02 mg/dL (ref 0.61–1.24)
Chloride: 100 mmol/L — ABNORMAL LOW (ref 101–111)
GFR calc non Af Amer: >60 mL/min (ref >60)
GLUCOSE: 89 mg/dL (ref 65–99)
Potassium: 3.1 mmol/L — ABNORMAL LOW (ref 3.5–5.1)
SODIUM: 134 mmol/L — AB (ref 135–145)
Total Protein: 6.6 g/dL (ref 6.5–8.1)

## 2015-11-18 LAB — URINALYSIS, ROUTINE W REFLEX MICROSCOPIC
BILIRUBIN URINE: NEGATIVE
Glucose, UA: NEGATIVE mg/dL
Hgb urine dipstick: NEGATIVE
KETONES UR: NEGATIVE mg/dL
Leukocytes, UA: NEGATIVE
NITRITE: NEGATIVE
PH: 6 (ref 5.0–8.0)
Protein, ur: NEGATIVE mg/dL
Specific Gravity, Urine: 1.012 (ref 1.005–1.030)

## 2015-11-18 LAB — CBC
HEMATOCRIT: 29.4 % — AB (ref 39.0–52.0)
HEMOGLOBIN: 9.7 g/dL — AB (ref 13.0–17.0)
MCH: 29.3 pg (ref 26.0–34.0)
MCHC: 33 g/dL (ref 30.0–36.0)
MCV: 88.8 fL (ref 78.0–100.0)
Platelets: 258 10*3/uL (ref 150–400)
RBC: 3.31 MIL/uL — ABNORMAL LOW (ref 4.22–5.81)
RDW: 14.2 % (ref 11.5–15.5)
WBC: 4.2 10*3/uL (ref 4.0–10.5)

## 2015-11-18 LAB — CREATININE, SERUM: Creatinine, Ser: 0.81 mg/dL (ref 0.61–1.24)

## 2015-11-18 LAB — I-STAT CG4 LACTIC ACID, ED
Lactic Acid, Venous: 0.91 mmol/L (ref 0.5–2.0)
Lactic Acid, Venous: 0.3 mmol/L — ABNORMAL LOW (ref 0.5–2.0)

## 2015-11-18 LAB — RAPID URINE DRUG SCREEN, HOSP PERFORMED
Amphetamines: POSITIVE — AB
BARBITURATES: NOT DETECTED
Benzodiazepines: POSITIVE — AB
COCAINE: NOT DETECTED
OPIATES: NOT DETECTED
Tetrahydrocannabinol: POSITIVE — AB

## 2015-11-18 LAB — TSH: TSH: 0.173 u[IU]/mL — ABNORMAL LOW (ref 0.350–4.500)

## 2015-11-18 LAB — TROPONIN I

## 2015-11-18 MED ORDER — POTASSIUM CHLORIDE CRYS ER 20 MEQ PO TBCR: 40.0000 meq | EXTENDED_RELEASE_TABLET | Freq: Once | ORAL | Status: DC

## 2015-11-18 MED ORDER — SODIUM CHLORIDE 0.9% FLUSH
3.0000 mL | Freq: Two times a day (BID) | INTRAVENOUS | Status: DC
  Administered 2015-11-19: 3 mL via INTRAVENOUS

## 2015-11-18 MED ORDER — HYDROCORTISONE NA SUCCINATE PF 100 MG IJ SOLR
100.0000 mg | Freq: Once | INTRAMUSCULAR | Status: AC
  Administered 2015-11-18: 100 mg via INTRAVENOUS
  Filled 2015-11-18: qty 2

## 2015-11-18 MED ORDER — SODIUM CHLORIDE 0.9 % IV BOLUS (SEPSIS): 1000.0000 mL | INTRAVENOUS | Status: DC | PRN

## 2015-11-18 MED ORDER — AZATHIOPRINE 50 MG PO TABS
50.0000 mg | ORAL_TABLET | Freq: Every day | ORAL | Status: DC
  Filled 2015-11-18: qty 1

## 2015-11-18 MED ORDER — PIPERACILLIN-TAZOBACTAM 3.375 G IVPB 30 MIN
3.3750 g | Freq: Once | INTRAVENOUS | Status: AC
  Administered 2015-11-18: 3.375 g via INTRAVENOUS
  Filled 2015-11-18: qty 50

## 2015-11-18 MED ORDER — HYDROCORTISONE NA SUCCINATE PF 100 MG IJ SOLR
100.0000 mg | Freq: Three times a day (TID) | INTRAMUSCULAR | Status: DC
  Administered 2015-11-19: 100 mg via INTRAVENOUS
  Filled 2015-11-18: qty 2

## 2015-11-18 MED ORDER — PIPERACILLIN-TAZOBACTAM 3.375 G IVPB: 3.3750 g | Freq: Three times a day (TID) | INTRAVENOUS | Status: DC

## 2015-11-18 MED ORDER — ONDANSETRON HCL 4 MG PO TABS: 4.0000 mg | ORAL_TABLET | Freq: Four times a day (QID) | ORAL | Status: DC | PRN

## 2015-11-18 MED ORDER — FOLIC ACID 1 MG PO TABS: 1.0000 mg | ORAL_TABLET | Freq: Every day | ORAL | Status: DC

## 2015-11-18 MED ORDER — SODIUM CHLORIDE 0.9 % IV SOLN
1000.0000 mL | Freq: Once | INTRAVENOUS | Status: AC
  Administered 2015-11-18: 1000 mL via INTRAVENOUS

## 2015-11-18 MED ORDER — LORAZEPAM 1 MG PO TABS: 1.0000 mg | ORAL_TABLET | Freq: Every day | ORAL | Status: DC

## 2015-11-18 MED ORDER — TAMSULOSIN HCL 0.4 MG PO CAPS: 0.4000 mg | ORAL_CAPSULE | Freq: Every day | ORAL | Status: DC

## 2015-11-18 MED ORDER — NALOXONE HCL 2 MG/2ML IJ SOSY
1.0000 mg | PREFILLED_SYRINGE | Freq: Once | INTRAMUSCULAR | Status: AC
  Administered 2015-11-18: 1 mg via INTRAVENOUS
  Filled 2015-11-18: qty 2

## 2015-11-18 MED ORDER — HEPARIN SODIUM (PORCINE) 5000 UNIT/ML IJ SOLN
5000.0000 [IU] | Freq: Three times a day (TID) | INTRAMUSCULAR | Status: DC
  Administered 2015-11-19: 5000 [IU] via SUBCUTANEOUS
  Filled 2015-11-18: qty 1

## 2015-11-18 MED ORDER — SODIUM CHLORIDE 0.9 % IV BOLUS (SEPSIS)
1000.0000 mL | INTRAVENOUS | Status: AC
  Administered 2015-11-18 (×2): 1000 mL via INTRAVENOUS

## 2015-11-18 MED ORDER — IOPAMIDOL (ISOVUE-300) INJECTION 61%
INTRAVENOUS | Status: AC
  Administered 2015-11-18: 100 mL
  Filled 2015-11-18: qty 100

## 2015-11-18 MED ORDER — NALOXONE HCL 0.4 MG/ML IJ SOLN
0.4000 mg | Freq: Once | INTRAMUSCULAR | Status: AC
  Administered 2015-11-18: 0.4 mg via INTRAVENOUS
  Filled 2015-11-18: qty 1

## 2015-11-18 MED ORDER — SODIUM CHLORIDE 0.9 % IV SOLN
1000.0000 mL | INTRAVENOUS | Status: DC
  Administered 2015-11-18: 1000 mL via INTRAVENOUS

## 2015-11-18 MED ORDER — ONDANSETRON HCL 4 MG/2ML IJ SOLN: 4.0000 mg | Freq: Four times a day (QID) | INTRAMUSCULAR | Status: DC | PRN

## 2015-11-18 MED ORDER — POTASSIUM CHLORIDE 10 MEQ/100ML IV SOLN
10.0000 meq | INTRAVENOUS | Status: AC
  Administered 2015-11-18 (×3): 10 meq via INTRAVENOUS
  Filled 2015-11-18 (×3): qty 100

## 2015-11-18 MED ORDER — SODIUM CHLORIDE 0.9 % IV BOLUS (SEPSIS)
500.0000 mL | INTRAVENOUS | Status: AC
  Administered 2015-11-18: 500 mL via INTRAVENOUS

## 2015-11-18 MED ORDER — PANTOPRAZOLE SODIUM 40 MG IV SOLR
40.0000 mg | Freq: Two times a day (BID) | INTRAVENOUS | Status: DC
  Administered 2015-11-19: 40 mg via INTRAVENOUS
  Filled 2015-11-18: qty 40

## 2015-11-18 MED ORDER — LAMOTRIGINE 100 MG PO TABS: 200.0000 mg | ORAL_TABLET | Freq: Every day | ORAL | Status: DC

## 2015-11-18 NOTE — ED Notes (Signed)
PA at bedside.

## 2015-11-18 NOTE — ED Notes (Signed)
Mother: Legacy Raffetto would like to be updated: [phone]

## 2015-11-18 NOTE — H&P (Addendum)
TRH H&P   Patient Demographics:    [name], is a 35 y.o. male  MRN: [id number]   DOB - 02-Nov-1980  Admit Date - 11/18/2015  Outpatient Primary MD for the patient is Sanda Linger, MD  Referring MD/NP/PA: Okey Dupre PA  Outpatient Specialists: Dr. Arlyce Dice gastroenterologist, now also following GI and surgeons at [facility] and [facility]  Patient coming from: [facility]  Chief Complaint  Patient presents with  . Hypotension  . Aphasia      HPI:    [name]  is a 35 y.o. male, With history of ulcerative colitis chronically on steroids and Imuran, bipolar disorder on multiple psych medications and sedative medications, recent placement of ileostomy status post takedown at [facility] 2 weeks ago, anxiety, generalized weakness uses walker to ambulate, ADHD, who had ileostomy takedown surgery at [facility] 2 weeks ago and was doing fairly well.  Today presented to PCP office and was found to have have generalized weakness and slurred speech, he also had a fall in the lobby, when EMS arrived he was found to be hypotensive and somnolent, he was brought to the ER, in the ER he was primarily treated for hypotension, CT abdomen pelvis was obtained and it was unremarkable, his lactic acid and initial lab work was stable. Urine drug screen was positive for benzodiazepines, narcotics and marijuana. I was called to admit for hypotension.  Upon arrival I found the patient to be severely somnolent, this was not conveyed to me, patient will answer basic questions and follow basic commands, he does not appear to be in any distress, his pupils are bilaterally symmetrical and reactive, his mother is sitting bedside and she does say that he  might have taken some extra pills but she is not sure. He does take a lot of pain and anxiety medications according to the mother along with psych medications.   Review of systems:    Limited review of systems as patient is obtunded, however he denies any headache, no chest or abdominal pain, and says basic questions and follows basic commands.   With Past History of the following :    Past Medical History  Diagnosis Date  . Anxiety   . Ulcerative colitis   . Bipolar 1 disorder (HCC)   . Insomnia   . ADHD (attention deficit hyperactivity  disorder)   . Depression   . Excessive weight loss   . IBS (irritable bowel syndrome)       Past Surgical History  Procedure Laterality Date  . Colonoscopy w/ biopsies      hx ulcerative colitis  . Anal fissurectomy        Social History:     Social History  Substance Use Topics  . Smoking status: Never Smoker   . Smokeless tobacco: Never Used  . Alcohol Use: No     Lives - At home alone, walks using a walker      Family History :     Family History  Problem Relation Age of Onset  . Lymphoma Paternal Grandmother   . Leukemia Maternal Grandmother   . Colon cancer Neg Hx        Home Medications:   Prior to Admission medications   Medication Sig Start Date End Date Taking? Authorizing Provider  ALPRAZolam Prudy Feeler) 1 MG tablet Take 2 mg by mouth 3 (three) times daily as needed for anxiety. 05/15/12  Yes Curlene Labrum Readling, MD  amphetamine-dextroamphetamine (ADDERALL) 20 MG tablet Take 20 mg by mouth 3 (three) times daily.    Yes Historical Provider, MD  EPINEPHrine (EPIPEN) 0.3 mg/0.3 mL SOAJ injection Inject 0.3 mLs (0.3 mg total) into the muscle as needed. Patient taking differently: Inject 0.3 mg into the muscle daily as needed (allergic reaction).  09/16/13  Yes Lonia Skinner Sofia, PA-C  gabapentin (NEURONTIN) 100 MG capsule Take 100 mg by mouth 3 (three) times daily. For anxiety   Yes Historical Provider, MD  Multiple Vitamin  (MULTIVITAMIN WITH MINERALS) TABS Take 1 tablet by mouth daily. As a nutritional supplement. 05/15/12  Yes Curlene Labrum Readling, MD  oxyCODONE (OXY IR/ROXICODONE) 5 MG immediate release tablet Take 5 mg by mouth every 6 (six) hours as needed for severe pain.   Yes Historical Provider, MD  traZODone (DESYREL) 150 MG tablet Take 150 mg by mouth at bedtime.   Yes Historical Provider, MD  ziprasidone (GEODON) 40 MG capsule Take 40-80 mg by mouth 2 (two) times daily with a meal. Takes 80mg  in the morning and 40mg  in the evening   Yes Historical Provider, MD  zolpidem (AMBIEN) 10 MG tablet Take 10 mg by mouth at bedtime.    Yes Historical Provider, MD  azaTHIOprine (IMURAN) 50 MG tablet TAKE (3) TABLETS DAILY. 04/02/14   Louis Meckel, MD  folic acid (FOLVITE) 1 MG tablet TAKE 1 TABLET EACH DAY. 04/02/14   Louis Meckel, MD  glycopyrrolate (ROBINUL) 2 MG tablet Take 1 tablet (2 mg total) by mouth 2 (two) times daily. 03/20/14   Iva Boop, MD  hyoscyamine (LEVBID) 0.375 MG 12 hr tablet Take 0.375 mg by mouth 2 (two) times daily.  02/22/14   Historical Provider, MD  lamoTRIgine (LAMICTAL) 200 MG tablet Take 1 tablet (200 mg total) by mouth daily. For mood stabilization. 05/15/12   Ronny Bacon, MD  predniSONE (DELTASONE) 20 MG tablet Take 40 mg for two weeks and then decrease to 30 mg once daily until follow up visit Patient taking differently: Take 20 mg by mouth daily with breakfast.  02/26/14   [name] S Esterwood, PA-C  Probiotic Product (VSL#3 DS) PACK Take 1 each by mouth 4 (four) times daily. 03/15/14   Louis Meckel, MD  promethazine (PHENERGAN) 25 MG tablet TAKE 1 TABLET EVERY 6 HOURS AS NEEDED FOR NAUSEA & VOMITING.    Louis Meckel,  MD  traZODone (DESYREL) 100 MG tablet Take 2 tablets (200 mg total) by mouth at bedtime as needed for sleep. 05/15/12   Ronny Bacon, MD     Allergies:     Allergies  Allergen Reactions  . Cephalexin     REACTION: urticaria (hives)     Physical Exam:    Vitals  Blood pressure 101/68, pulse 97, temperature 97.8 F (36.6 C), temperature source Rectal, resp. rate 12, height  (1.88 m), weight 74.844 kg (165 lb), SpO2 100 %.   1. General middle aged white male lying in bed in somnolent,  2. Insight cannot be evaluated patient is too somnolent,    3. No F.N deficits, ALL C.Nerves Intact, moving all 4 extremities to command, plantars downgoing,   4. Ears and Eyes appear Normal, Conjunctivae clear, PERRLA. Moist Oral Mucosa.  5. Supple Neck, No JVD, No cervical lymphadenopathy appriciated, No Carotid Bruits.  6. Symmetrical Chest wall movement, Good air movement bilaterally, CTAB.  7. RRR, No Gallops, Rubs or Murmurs, No Parasternal Heave.  8. Positive Bowel Sounds, Abdomen Soft, No tenderness, No organomegaly appriciated,No rebound -guarding or rigidity. Abd post op scars healing well,  9.  No Cyanosis, Normal Skin Turgor, No Skin Rash or Bruise.  10. Good muscle tone,  joints appear normal , no effusions, Normal ROM.  11. No Palpable Lymph Nodes in Neck or Axillae      Data Review:    CBC  Recent Labs Lab 11/18/15 1307  WBC 5.3  HGB 11.6*  HCT 35.9*  PLT 304  MCV 88.0  MCH 28.4  MCHC 32.3  RDW 13.9  LYMPHSABS 1.4  MONOABS 0.5  EOSABS 0.1  BASOSABS 0.0   ------------------------------------------------------------------------------------------------------------------  Chemistries   Recent Labs Lab 11/18/15 1307  NA 134*  K 3.1*  CL 100*  CO2 24  GLUCOSE 89  BUN 5*  CREATININE 1.02  CALCIUM 8.7*  AST 14*  ALT 11*  ALKPHOS 55  BILITOT 0.9   ------------------------------------------------------------------------------------------------------------------ estimated creatinine clearance is 105.9 mL/min (by C-G formula based on Cr of 1.02). ------------------------------------------------------------------------------------------------------------------ No results for input(s): TSH, T4TOTAL,  T3FREE, THYROIDAB in the last 72 hours.  Invalid input(s): FREET3  Coagulation profile No results for input(s): INR, PROTIME in the last 168 hours. ------------------------------------------------------------------------------------------------------------------- No results for input(s): DDIMER in the last 72 hours. -------------------------------------------------------------------------------------------------------------------  Cardiac Enzymes No results for input(s): CKMB, TROPONINI, MYOGLOBIN in the last 168 hours.  Invalid input(s): CK ------------------------------------------------------------------------------------------------------------------ No results found for: BNP   ---------------------------------------------------------------------------------------------------------------  Urinalysis    Component Value Date/Time   COLORURINE YELLOW 11/18/2015 1635   APPEARANCEUR CLEAR 11/18/2015 1635   LABSPEC 1.012 11/18/2015 1635   PHURINE 6.0 11/18/2015 1635   GLUCOSEU NEGATIVE 11/18/2015 1635   GLUCOSEU NEGATIVE 06/14/2008 1517   HGBUR NEGATIVE 11/18/2015 1635   BILIRUBINUR NEGATIVE 11/18/2015 1635   BILIRUBINUR n 10/07/2012 0922   KETONESUR NEGATIVE 11/18/2015 1635   PROTEINUR NEGATIVE 11/18/2015 1635   PROTEINUR 30+ 10/07/2012 0922   UROBILINOGEN 0.2 10/07/2012 0922   UROBILINOGEN 1.0 mg/dL 96/29/5284 1324   NITRITE NEGATIVE 11/18/2015 1635   NITRITE n 10/07/2012 0922   LEUKOCYTESUR NEGATIVE 11/18/2015 1635    ----------------------------------------------------------------------------------------------------------------   Imaging Results:    Ct Abdomen Pelvis W Contrast  11/18/2015  CLINICAL DATA:  Ulcerative colitis.  Hypotension. EXAM: CT ABDOMEN AND PELVIS WITH CONTRAST TECHNIQUE: Multidetector CT imaging of the abdomen and pelvis was performed using the standard protocol following bolus administration of intravenous contrast. CONTRAST:  100cc ISOVUE-300  IOPAMIDOL (ISOVUE-300) INJECTION 61% COMPARISON:  09/11/2015 FINDINGS: The left lower quadrant ileostomy has been surgically corrected with presumed reanastomosis of the ileum to residual colon. There is no obvious abscess or extraluminal bowel gas. There is no disproportionate dilatation of bowel to suggest bowel obstruction. The previously seen abscess within the perirectal location has resolved. The pigtail has also been removed. There is a suspected loop of bowel traversing posterior to the dome of the bladder marked by arrows. It does appear somewhat narrowed likely due to scarring. Liver, gallbladder, spleen, pancreas, adrenal glands are within normal limits Mild bilateral hydronephrosis.  Bladder is distended. No abnormal adenopathy. No vertebral compression deformity. Chronic fracture of the right L4 inferior articular process. IMPRESSION: Postop changes as described. Left lower quadrant ileostomy has been taken down with reanastomosis and there is no evidence of complication such as abscess, perforation, or bowel obstruction Bladder remains distended with bilateral hydronephrosis. Electronically Signed   By: Jolaine Click M.D.   On: 11/18/2015 16:13   Dg Chest Port 1 View  11/18/2015  CLINICAL DATA:  Weakness and slurred speech earlier today, followed with fall EXAM: PORTABLE CHEST 1 VIEW COMPARISON:  September 11, 2015 FINDINGS: There is no edema or consolidation. Heart size and pulmonary vascularity are normal. No adenopathy. No bone lesions. IMPRESSION: No edema or consolidation. Electronically Signed   By: Bretta Bang III M.D.   On: 11/18/2015 13:57    My personal review of EKG: Rhythm NSR, LVH with chronic J point elevation   Assessment & Plan:     1. Encephalopathy. Likely due to polypharmacy. Will be admitted to stepdown, nothing by mouth, IV fluids, he had minimal response to Narcan hence I think benzodiazepine is the main culprit. Will obtain a head CT and ABG. Deep head of the bed  elevated to 60, monitor closely in stepdown with supportive care. Hold all offending medications including narcotics, we'll resume low-dose benzodiazepines from tomorrow to prevent abrupt withdrawal, we will hold Phenergan or any other sedative psych medications.   I have told the mother to inform patient's PCP and psychiatrist to cut down on sedative medications at this can amount to accidental overdose and death in the future.  2. Hypotension. Likely combination of polypharmacy and dehydration from decreased by mouth intake. Agree with IV full bolus, no fever, UA unremarkable, chest x-ray stable, lactate stable, he is on chronic steroids for his ulcerative colitis hence I will challenge him with IV hydrocortisone along with aggressive IV fluids and monitor. Check baseline TSH. His cortisol will be suppressed hence will not be checked.  3. Ulcerative colitis. Recent ileostomy takedown, no acute issues on CT abdomen pelvis, currently on stress dose steroids due to hypotension, we'll resume Imuran if he is able to take orally tomorrow. Outpatient follow-up with his primary gastroenterologist and surgeon postdischarge.  4. Generalized weakness and deconditioning. Commence PT tomorrow.  5. History of bipolar disorder, ADHD. For now will hold Adderall, Desyrel, trazodone, OxyIR, Neurontin. 2 avoid any further sedation.  6. LVH with chronic and stable appearing J-point elevation. Check single troponin, control blood pressure.   7. Hypokalemia. Replaced.   8. Urinary retention upon admission - St.Cath with 400cc, placed on flomax, reduce Narcotics-Benzos, increase activity, bladder scan q shift, creatinine is stable.   DVT Prophylaxis Heparin    AM Labs Ordered, also please review Full Orders  Family Communication: Admission, patients condition and plan of care including tests being ordered have been discussed with the patient and mother who indicate  understanding and agree with the plan and Code  Status.  Code Status Full  Likely DC to  Home 2-3 days  Condition GUARDED   Consults called: None    Admission status: Inpt stepdown  Time spent in minutes : 35   Xariah Silvernail K M.D on 11/18/2015 at 5:38 PM  Between 7am to 7pm - Pager - [phone]. After 7pm go to [email or web address] - password [facility]  [facility] - Office  [phone]

## 2015-11-18 NOTE — ED Notes (Signed)
Security had to be called to come to bedside. Pt yelling at the top of his lungs demanding water.

## 2015-11-18 NOTE — ED Notes (Signed)
Pt has become more lethargic since arrival. MD aware. Dose of Narcan ordered.

## 2015-11-18 NOTE — ED Notes (Signed)
Dr. Kirby at bedside 

## 2015-11-18 NOTE — ED Notes (Signed)
Pt more alert now, laying in stretcher playing on laptop. Pt requesting to leave but persuaded to wait to speak with MD.

## 2015-11-18 NOTE — ED Notes (Signed)
Craige Cotta called this RN and was updated about patients vital signs and pt condition.

## 2015-11-18 NOTE — ED Notes (Signed)
[facility]- pt coming from PCP office with c/o slurred speech and weakness. Pt was normal this morning at an appointment at 0800 and went to his next appointment and became weak. Slurred speech noted. Pt also reportedly fell in the lobby. Pt is alert and oriented. Hypotensive with EMS at 78/64.

## 2015-11-18 NOTE — ED Notes (Signed)
Pt demanding to leave. This RN attempted to persuade him to wait until admitting MD can come speak with him but he did not want to wait. This RN asked ER Dr. Criss Alvine to come to speak with patient and Dr. Criss Alvine was able to convince pt to wait until admitting doctor can speak with him. Pt informed if he left now it would be AMA and pt became mad and stated he would sue the doctor if he put AMA in his chart. This RN explained to pt how AMA works and what it means to leave AMA but pt continued to state he would sue the doctor and began to use profane language toward the ER. Admitting MD paged again.

## 2015-11-18 NOTE — ED Notes (Signed)
Hospitalist at bedside 

## 2015-11-18 NOTE — Progress Notes (Addendum)
Pt belligerent in ED, cursing at staff and wanting to leave AMA. Per RN, ED MD spoke to pt and advised him to stay and if he did choose to leave it would be AMA. Pt became more irate and security called. NP to room. Pt with pressured and rambling speech and cursing at staff. Explained to pt that admitting MD felt him to be "sick enough" to be admitted and that was this NP's advice as well. Pt states he is leaving but wants this NP to officially discharge him because his blood pressure is over 90 now. I stated that he should stay and be treated and again he became belligerent and cursed at this NP. He then stated he was going to sue me. NP told pt that if he wanted to leave, his only choice was to go AMA.  Jimmye Norman, NP [facility] Update: Pt calmed down and became reasonable and this NP was able to have a conversation with him. After explaining the reasons we would like him to stay, he agrees to stay. Will let him have a diet now. Hopefully, he can be d/c'd in the am should all go well. Also, he is on disability and states he can not afford to pay a late fee on his rent and that is why he was so anxious to leave. He is cooperative now and agrees to stay. This NP offered referral to SW for some possible assistance with personal issues/housing, etc. He agreed with this plan.  KJKG, NP [facility] Pt has been recording interactions with staff on his laptop which is against policy. Is presently being asked to delete these recordings. Security remains at bedside.  Update: Again, pt is being beligerant to staff in ED and requested to speak to this NP again. Pt continues to be concerned with what time he will be able to go home today. Again, this NP told the pt that there is no guarantee of a time that he will be discharged today, if he even is able to be discharged today.  KJKG, NP [facility] Update: Immediately after above conversation, NP paged again because pt was belligerent, confrontational, cursing, and trying to  fight staff. Per RN, pt will be escorted out by the police due to his behavior as per policy.  KJKG, NP [facility]

## 2015-11-18 NOTE — ED Provider Notes (Signed)
CSN: [id number]     Arrival date & time 11/18/15  1240 History   First MD Initiated Contact with Patient 11/18/15 1250     Chief Complaint  Patient presents with  . Hypotension  . Aphasia     (Consider location/radiation/quality/duration/timing/severity/associated sxs/prior Treatment) HPI  [name] is a 35 y.o. male with PMH significant for anxiety, BPD, ADHD, UC s/p ileostomy take down 11/05/15 (Dr. Epifania Gore with [facility]) who presents with sudden onset generalized weakness.  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  Denies LOC, head injury, headache, neck pain, or pain anywhere else.  Mom states that he normally has intermittent slurred speech, and it seems slightly worse today.  Patient lives at home alone with HHA.  He reports taking all medications as prescribed.  Denies fever, chills, CP, SOB, cough, new abdominal pain, N/V/D, urinary symptoms, numbness, or unilateral weakness.  Past Medical History  Diagnosis Date  . Anxiety   . Ulcerative colitis   . Bipolar 1 disorder (HCC)   . Insomnia   . ADHD (attention deficit hyperactivity disorder)   . Depression   . Excessive weight loss   . IBS (irritable bowel syndrome)    Past Surgical History  Procedure Laterality Date  . Colonoscopy w/ biopsies      hx ulcerative colitis  . Anal fissurectomy     Family History  Problem Relation Age of Onset  . Lymphoma Paternal Grandmother   . Leukemia Maternal Grandmother   . Colon cancer Neg Hx    Social History  Substance Use Topics  . Smoking status: Never Smoker   . Smokeless tobacco: Never Used  . Alcohol Use: No    Review of Systems  Constitutional: Negative for fever and chills.  Respiratory: Negative for cough and shortness of breath.   Cardiovascular: Negative for chest pain.  Gastrointestinal: Positive for abdominal pain (chronic). Negative for nausea,  vomiting and diarrhea.  Genitourinary: Negative for dysuria, frequency and hematuria.  Skin: Negative for wound.  Neurological: Positive for speech difficulty (intermittent slurred speech, baseline) and weakness. Negative for dizziness, syncope, facial asymmetry, light-headedness, numbness and headaches.  All other systems reviewed and are negative.     Allergies  Cephalexin  Home Medications   Prior to Admission medications   Medication Sig Start Date End Date Taking? Authorizing Provider  ALPRAZolam Prudy Feeler) 1 MG tablet Take 2 mg by mouth 3 (three) times daily as needed for anxiety. 05/15/12  Yes Curlene Labrum Readling, MD  amphetamine-dextroamphetamine (ADDERALL) 20 MG tablet Take 20 mg by mouth 4 (four) times daily.   Yes Historical Provider, MD  EPINEPHrine (EPIPEN) 0.3 mg/0.3 mL SOAJ injection Inject 0.3 mLs (0.3 mg total) into the muscle as needed. Patient taking differently: Inject 0.3 mg into the muscle daily as needed (allergic reaction).  09/16/13  Yes Lonia Skinner Sofia, PA-C  gabapentin (NEURONTIN) 100 MG capsule Take 100 mg by mouth 3 (three) times daily. For anxiety   Yes Historical Provider, MD  Multiple Vitamin (MULTIVITAMIN WITH MINERALS) TABS Take 1 tablet by mouth daily. As a nutritional supplement. 05/15/12  Yes Curlene Labrum Readling, MD  oxyCODONE (OXY IR/ROXICODONE) 5 MG immediate release tablet Take 5 mg by mouth every 6 (six) hours as needed for severe pain.   Yes Historical Provider, MD  azaTHIOprine (IMURAN) 50 MG tablet TAKE (3) TABLETS DAILY. 04/02/14   Louis Meckel, MD  folic acid (FOLVITE) 1 MG tablet TAKE 1 TABLET EACH DAY. 04/02/14   Louis Meckel, MD  glycopyrrolate (ROBINUL) 2 MG tablet Take 1 tablet (2 mg total) by mouth 2 (two) times daily. 03/20/14   Iva Boop, MD  hyoscyamine (LEVBID) 0.375 MG 12 hr tablet Take 0.375 mg by mouth 2 (two) times daily.  02/22/14   Historical Provider, MD  lamoTRIgine (LAMICTAL) 200 MG tablet Take 1 tablet (200 mg total) by mouth daily.  For mood stabilization. 05/15/12   Ronny Bacon, MD  predniSONE (DELTASONE) 20 MG tablet Take 40 mg for two weeks and then decrease to 30 mg once daily until follow up visit Patient taking differently: Take 20 mg by mouth daily with breakfast.  02/26/14   [name] S Esterwood, PA-C  Probiotic Product (VSL#3 DS) PACK Take 1 each by mouth 4 (four) times daily. 03/15/14   Louis Meckel, MD  promethazine (PHENERGAN) 25 MG tablet TAKE 1 TABLET EVERY 6 HOURS AS NEEDED FOR NAUSEA & VOMITING.    Louis Meckel, MD  traZODone (DESYREL) 100 MG tablet Take 2 tablets (200 mg total) by mouth at bedtime as needed for sleep. 05/15/12   Curlene Labrum Readling, MD  ziprasidone (GEODON) 40 MG capsule Take 40-80 mg by mouth 2 (two) times daily with a meal. Takes 80mg  in the morning and 40mg  in the evening    Historical Provider, MD  zolpidem (AMBIEN) 10 MG tablet Take 10 mg by mouth at bedtime.     Historical Provider, MD   BP 101/68 mmHg  Pulse 94  Temp(Src) 97.8 F (36.6 C) (Rectal)  Resp 14  Ht 6\' 2"  (1.88 m)  Wt 74.844 kg  BMI 21.18 kg/m2  SpO2 98% Physical Exam  Constitutional: He appears well-developed and well-nourished. He appears listless.  Non-toxic appearance. He has a sickly appearance. He appears ill.  HENT:  Head: Normocephalic and atraumatic.  Mouth/Throat: Oropharynx is clear and moist.  Protecting airway.  Eyes: Conjunctivae are normal. Pupils are equal, round, and reactive to light.  Neck: Normal range of motion. Neck supple.  Cardiovascular: Normal rate, regular rhythm and normal heart sounds.   No murmur heard. Pulmonary/Chest: Effort normal and breath sounds normal. No accessory muscle usage or stridor. No respiratory distress. He has no wheezes. He has no rhonchi. He has no rales.  Abdominal: Soft. Bowel sounds are normal. He exhibits no distension. There is no tenderness. There is no rebound and no guarding.  3 well healing abdominal surgical scars without signs of infection.    Musculoskeletal: Normal range of motion.  Lymphadenopathy:    He has no cervical adenopathy.  Neurological: He has normal strength. He appears listless. No cranial nerve deficit or sensory deficit. GCS eye subscore is 4. GCS verbal subscore is 5. GCS motor subscore is 6.  Skin: Skin is warm and dry.  Psychiatric: He has a normal mood and affect. His behavior is normal.    ED Course  Procedures (including critical care time) Labs Review Labs Reviewed  COMPREHENSIVE METABOLIC PANEL - Abnormal; Notable for the following:    Sodium 134 (*)    Potassium 3.1 (*)    Chloride 100 (*)    BUN 5 (*)    Calcium 8.7 (*)    Albumin 3.4 (*)    AST 14 (*)    ALT 11 (*)    All other components within normal limits  CBC WITH DIFFERENTIAL/PLATELET - Abnormal; Notable for the following:  RBC 4.08 (*)    Hemoglobin 11.6 (*)    HCT 35.9 (*)    All other components within normal limits  CULTURE, BLOOD (ROUTINE X 2)  CULTURE, BLOOD (ROUTINE X 2)  URINE CULTURE  URINALYSIS, ROUTINE W REFLEX MICROSCOPIC (NOT AT [facility])  URINE RAPID DRUG SCREEN, HOSP PERFORMED  I-STAT CG4 LACTIC ACID, ED  I-STAT CG4 LACTIC ACID, ED    Imaging Review Ct Abdomen Pelvis W Contrast  11/18/2015  CLINICAL DATA:  Ulcerative colitis.  Hypotension. EXAM: CT ABDOMEN AND PELVIS WITH CONTRAST TECHNIQUE: Multidetector CT imaging of the abdomen and pelvis was performed using the standard protocol following bolus administration of intravenous contrast. CONTRAST:  100cc ISOVUE-300 IOPAMIDOL (ISOVUE-300) INJECTION 61% COMPARISON:  09/11/2015 FINDINGS: The left lower quadrant ileostomy has been surgically corrected with presumed reanastomosis of the ileum to residual colon. There is no obvious abscess or extraluminal bowel gas. There is no disproportionate dilatation of bowel to suggest bowel obstruction. The previously seen abscess within the perirectal location has resolved. The pigtail has also been removed. There is a suspected loop  of bowel traversing posterior to the dome of the bladder marked by arrows. It does appear somewhat narrowed likely due to scarring. Liver, gallbladder, spleen, pancreas, adrenal glands are within normal limits Mild bilateral hydronephrosis.  Bladder is distended. No abnormal adenopathy. No vertebral compression deformity. Chronic fracture of the right L4 inferior articular process. IMPRESSION: Postop changes as described. Left lower quadrant ileostomy has been taken down with reanastomosis and there is no evidence of complication such as abscess, perforation, or bowel obstruction Bladder remains distended with bilateral hydronephrosis. Electronically Signed   By: Jolaine Click M.D.   On: 11/18/2015 16:13   Dg Chest Port 1 View  11/18/2015  CLINICAL DATA:  Weakness and slurred speech earlier today, followed with fall EXAM: PORTABLE CHEST 1 VIEW COMPARISON:  September 11, 2015 FINDINGS: There is no edema or consolidation. Heart size and pulmonary vascularity are normal. No adenopathy. No bone lesions. IMPRESSION: No edema or consolidation. Electronically Signed   By: Bretta Bang III M.D.   On: 11/18/2015 13:57   I have personally reviewed and evaluated these images and lab results as part of my medical decision-making.   EKG Interpretation   Date/Time:  Tuesday November 18 2015 13:16:57 EDT Ventricular Rate:  93 PR Interval:  158 QRS Duration: 92 QT Interval:  399 QTC Calculation: 496 R Axis:   54 Text Interpretation:  Sinus rhythm Abnormal R-wave progression, early  transition ST elevation, consider inferior injury Borderline prolonged QT  interval since previous ekg, tachycardia has resolved Confirmed by LITTLE  MD, RACHEL ([id number]) on 11/18/2015 1:23:25 PM      MDM   Final diagnoses:  Hypotension, unspecified hypotension type  Lethargy   Patient presents with generalized weakness and hypotension.  Blood pressures ranging from SBP 70-80 and DBP 49-55.  On arrival, patient appears listless,  but easily aroused.  Denies fever, N/V/D, CP, cough, SOB, abdominal pain, or urinary symptoms. On exam, heart RRR, lungs CTAB, abdomen soft and non-tender.  Well healing surgical scars.  DDx broad including sepsis, hypovolemia, metabolic derangement, drugs.  Lactate 0.91, no leukocytosis, hemoglobin stable.  K 3.1; otherwise, CMP noncontributory.  Blood cultures obtained.  CXR negative.  EKG without acute changes. Patient given 1.5L NS and remains persistently hypotensive.  2:20 PM: Patient appears more listless, given 0.4 mg Narcan with little improvement.  1mg  Narcan, CT abdomen/pelvis, and Zosyn ordered.   3 PM: Upon recheck, patient  listless.  BP improved to 113/77.  Drugs? Will add UDS.   4: 20 PM: CT abdomen/pelvis shows no acute abnormalities.  Upon recheck, patient remains listless; however, BP has remained stable.  UDS and UA pending.  4:50: BP dropped again to 80s/50s.  3rd bolus ordered.  Plan to admit for hypotension.   Case has been discussed with and seen by Dr. Clarene Duke who agrees with the above plan for admission.   Cheri Fowler, PA-C 11/18/15 1711  Laurence Spates, MD 11/20/15 [phone]

## 2015-11-18 NOTE — ED Notes (Signed)
Elliot Gurney, Consulting civil engineer aware pt wanting to leave AMA and that this RN is in the process of getting in touch with admitting providers.

## 2015-11-19 LAB — CBC
HEMATOCRIT: 30.1 % — AB (ref 39.0–52.0)
Hemoglobin: 9.8 g/dL — ABNORMAL LOW (ref 13.0–17.0)
MCH: 28.9 pg (ref 26.0–34.0)
MCHC: 32.6 g/dL (ref 30.0–36.0)
MCV: 88.8 fL (ref 78.0–100.0)
PLATELETS: 306 10*3/uL (ref 150–400)
RBC: 3.39 MIL/uL — ABNORMAL LOW (ref 4.22–5.81)
RDW: 14.2 % (ref 11.5–15.5)
WBC: 5.1 10*3/uL (ref 4.0–10.5)

## 2015-11-19 LAB — BASIC METABOLIC PANEL
Anion gap: 6 (ref 5–15)
CO2: 23 mmol/L (ref 22–32)
CREATININE: 0.78 mg/dL (ref 0.61–1.24)
Calcium: 8.1 mg/dL — ABNORMAL LOW (ref 8.9–10.3)
Chloride: 110 mmol/L (ref 101–111)
GFR calc Af Amer: >60 mL/min (ref >60)
GLUCOSE: 134 mg/dL — AB (ref 65–99)
POTASSIUM: 3.8 mmol/L (ref 3.5–5.1)
SODIUM: 139 mmol/L (ref 135–145)

## 2015-11-19 LAB — URINE CULTURE: CULTURE: NO GROWTH

## 2015-11-19 MED ORDER — LORAZEPAM 2 MG/ML IJ SOLN: 1.0000 mg | Freq: Once | INTRAMUSCULAR | Status: DC

## 2015-11-19 MED ORDER — LAMOTRIGINE 100 MG PO TABS: 200.0000 mg | ORAL_TABLET | Freq: Every day | ORAL | Status: DC

## 2015-11-19 NOTE — ED Notes (Signed)
Security and [employer] at bedside. Pt screaming, attempting to rip off IV and cardiac monitor, BP cuff and O2 sats.

## 2015-11-19 NOTE — ED Notes (Signed)
Charge nurse explained hospital's policy to pt. and advised pt. that he will be escorted out of the hospital , admitting MD spoke with charged nurse prior to pt.'s discharge .

## 2015-11-19 NOTE — Discharge Summary (Signed)
                                                         AMA  Patient left AMA last night/early am, read the following documentation by the night team -      Pt belligerent in ED, cursing at staff and wanting to leave AMA. Per RN, ED MD spoke to pt and advised him to stay and if he did choose to leave it would be AMA. Pt became more irate and security called. NP to room. Pt with pressured and rambling speech and cursing at staff. Explained to pt that admitting MD felt him to be "sick enough" to be admitted and that was this NP's advice as well. Pt states he is leaving but wants this NP to officially discharge him because his blood pressure is over 90 now. I stated that he should stay and be treated and again he became belligerent and cursed at this NP. He then stated he was going to sue me. NP told pt that if he wanted to leave, his only choice was to go AMA.  Jimmye Norman, NP [facility] Update: Pt calmed down and became reasonable and this NP was able to have a conversation with him. After explaining the reasons we would like him to stay, he agrees to stay. Will let him have a diet now. Hopefully, he can be d/c'd in the am should all go well. Also, he is on disability and states he can not afford to pay a late fee on his rent and that is why he was so anxious to leave. He is cooperative now and agrees to stay. This NP offered referral to SW for some possible assistance with personal issues/housing, etc. He agreed with this plan.  KJKG, NP [facility] Pt has been recording interactions with staff on his laptop which is against policy. Is presently being asked to delete these recordings. Security remains at bedside.  Update: Again, pt is being beligerant to staff in ED and requested to speak to this NP again. Pt continues to be concerned with what time he will be able to go home today. Again, this NP told the pt that there is  no guarantee of a time that he will be discharged today, if he even is able to be discharged today.  KJKG, NP [facility] Update: Immediately after above conversation, NP paged again because pt was belligerent, confrontational, cursing, and trying to fight staff. Per RN, pt will be escorted out by the police due to his behavior as per policy.  [facility], NP [facility]     Irene Shipper M.D on 11/19/2015 at 6:42 AM  Between 7am to 7pm - Pager - [phone], After 7pm go to [email or web address] - password [facility]  [facility]  - Office  [phone]

## 2015-11-19 NOTE — Progress Notes (Signed)
                                                         AMA  Patient left AMA last night/early am, read the following documentation by the night team -      Pt belligerent in ED, cursing at staff and wanting to leave AMA. Per RN, ED MD spoke to pt and advised him to stay and if he did choose to leave it would be AMA. Pt became more irate and security called. NP to room. Pt with pressured and rambling speech and cursing at staff. Explained to pt that admitting MD felt him to be "sick enough" to be admitted and that was this NP's advice as well. Pt states he is leaving but wants this NP to officially discharge him because his blood pressure is over 90 now. I stated that he should stay and be treated and again he became belligerent and cursed at this NP. He then stated he was going to sue me. NP told pt that if he wanted to leave, his only choice was to go AMA.  Jimmye Norman, NP [facility] Update: Pt calmed down and became reasonable and this NP was able to have a conversation with him. After explaining the reasons we would like him to stay, he agrees to stay. Will let him have a diet now. Hopefully, he can be d/c'd in the am should all go well. Also, he is on disability and states he can not afford to pay a late fee on his rent and that is why he was so anxious to leave. He is cooperative now and agrees to stay. This NP offered referral to SW for some possible assistance with personal issues/housing, etc. He agreed with this plan.  KJKG, NP [facility] Pt has been recording interactions with staff on his laptop which is against policy. Is presently being asked to delete these recordings. Security remains at bedside.  Update: Again, pt is being beligerant to staff in ED and requested to speak to this NP again. Pt continues to be concerned with what time he will be able to go home today. Again, this NP told the pt that there is  no guarantee of a time that he will be discharged today, if he even is able to be discharged today.  KJKG, NP [facility] Update: Immediately after above conversation, NP paged again because pt was belligerent, confrontational, cursing, and trying to fight staff. Per RN, pt will be escorted out by the police due to his behavior as per policy.  [facility], NP [facility]     Irene Shipper M.D on 11/19/2015 at 6:41 AM  Between 7am to 7pm - Pager - [phone], After 7pm go to [email or web address] - password [email or web address]  [facility]  - Office  [phone]

## 2015-11-19 NOTE — ED Notes (Signed)
Patient made aware of [facility]'s Policy on recording staff in ED.  Patient did not stop recording staff, he did not have any staff member's permission and was told to stop.  Security summond to D34.

## 2015-11-19 NOTE — ED Notes (Signed)
Pt. became verbally abusive with nurse , states " fuck you and leave my room " while updating him that his request to speak with the admitting MD has been carried out .

## 2015-11-19 NOTE — ED Notes (Signed)
Admitting MD speaking with pt. at this time .

## 2015-11-19 NOTE — ED Notes (Signed)
Patient has become verbally abusive and recording staff with computer.  Admitting MD notified of patient's abusive behavior.  Jimmye Norman in agreeance of decision to let patient leave hospital at this time.  Patient was given a print out of recording policy in hospital.  Patient being escorted out of hospital by security.

## 2015-11-19 NOTE — ED Notes (Signed)
Pt agrees to stay if he can eat and drink. Dr Craige Cotta agrees.

## 2015-11-23 LAB — CULTURE, BLOOD (ROUTINE X 2)
CULTURE: NO GROWTH
CULTURE: NO GROWTH

## 2015-12-19 DIAGNOSIS — H547 Unspecified visual loss: Secondary | ICD-10-CM | POA: Insufficient documentation

## 2015-12-19 DIAGNOSIS — W19XXXA Unspecified fall, initial encounter: Secondary | ICD-10-CM | POA: Insufficient documentation

## 2016-01-28 DIAGNOSIS — Z659 Problem related to unspecified psychosocial circumstances: Secondary | ICD-10-CM | POA: Insufficient documentation

## 2016-04-20 DIAGNOSIS — R946 Abnormal results of thyroid function studies: Secondary | ICD-10-CM | POA: Insufficient documentation

## 2016-05-28 DIAGNOSIS — K625 Hemorrhage of anus and rectum: Secondary | ICD-10-CM | POA: Insufficient documentation

## 2016-05-31 DIAGNOSIS — R110 Nausea: Secondary | ICD-10-CM | POA: Insufficient documentation

## 2016-10-14 IMAGING — CT CT ABD-PELV W/ CM
2 of 4 series · 16 of 46 positions shown, 18 images · IV contrast (Omni 300)
Comparison: 09/11/2015

CLINICAL DATA: Ulcerative colitis.  Hypotension.

EXAM:
CT ABDOMEN AND PELVIS WITH CONTRAST
TECHNIQUE: Multidetector CT imaging of the abdomen and pelvis was performed
using the standard protocol following bolus administration of
intravenous contrast.
CONTRAST:  822cc A2DGFB-K88 IOPAMIDOL (A2DGFB-K88) INJECTION 61%

[Series 2: a/p w/ 5mm · axial · 0.77mm/px · z∈[+787,+1222]mm · 13 of 97 slices shown, 15 images]
[im 5/97  soft-tissue]
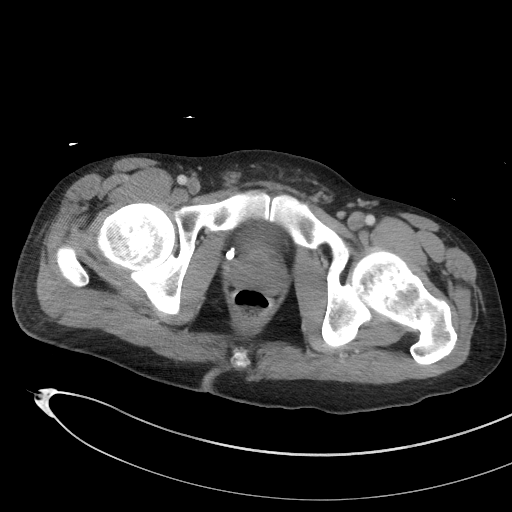
[im 5/97  bone]
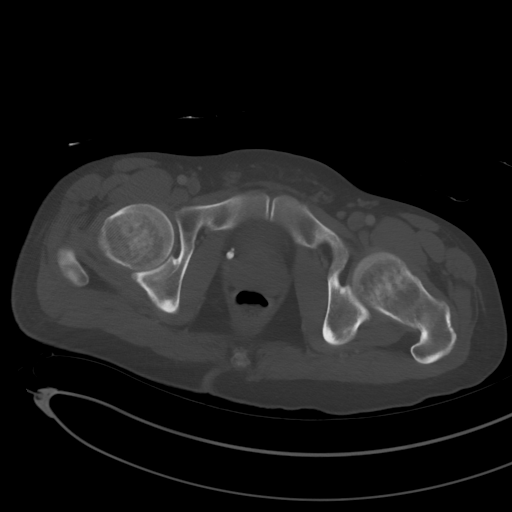
[im 13/97  soft-tissue]
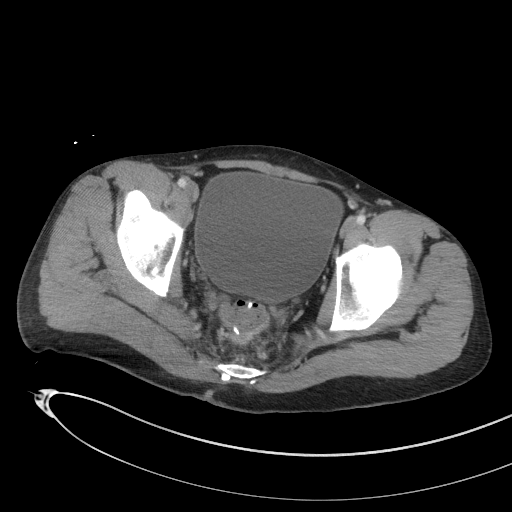
[im 21/97  soft-tissue]
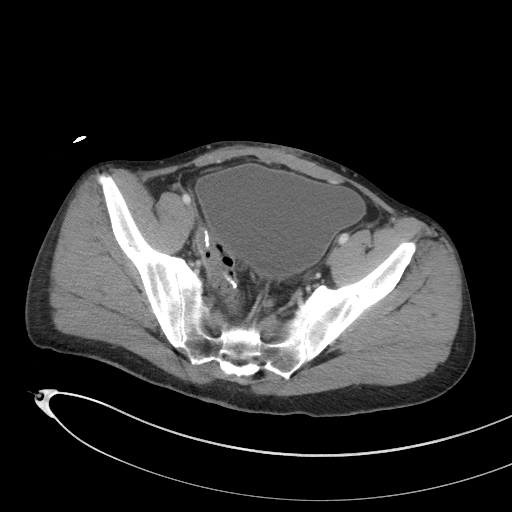
[im 26/97  soft-tissue]
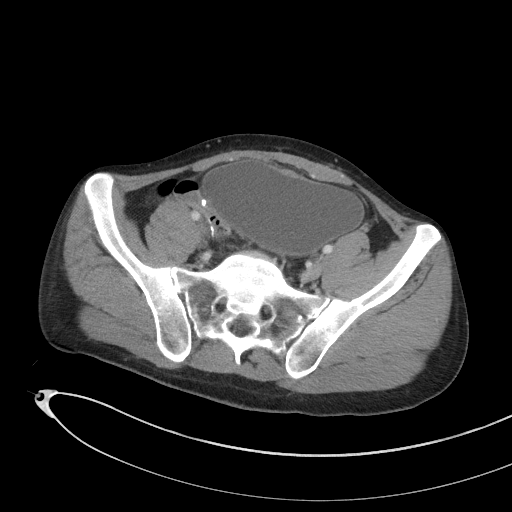
[im 34/97  soft-tissue]
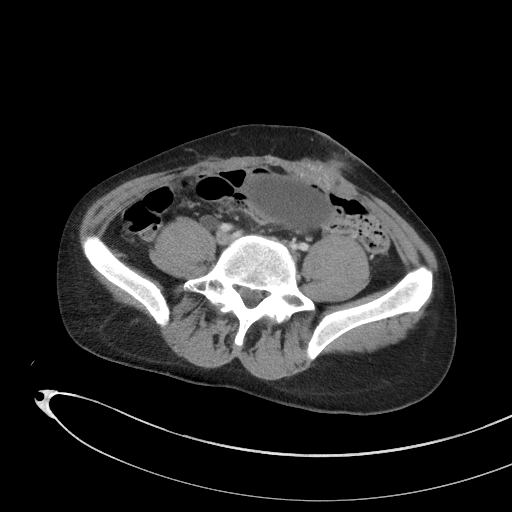
[im 42/97  soft-tissue]
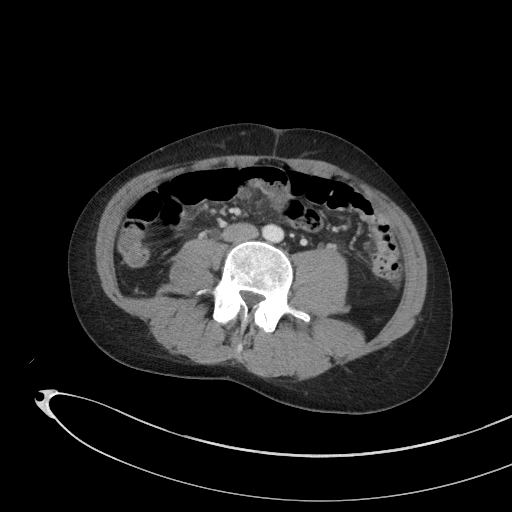
[im 51/97  soft-tissue]
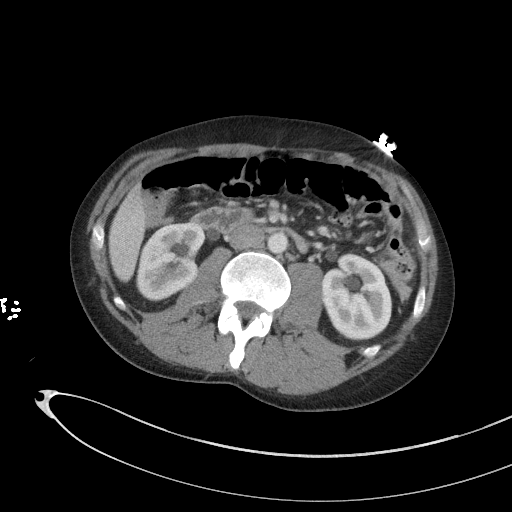
[im 55/97  soft-tissue]
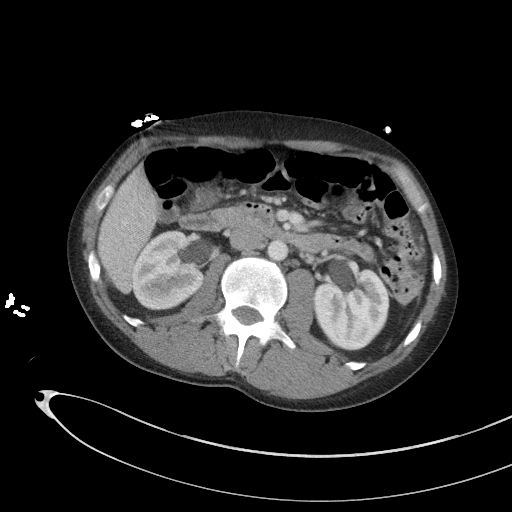
[im 63/97  soft-tissue]
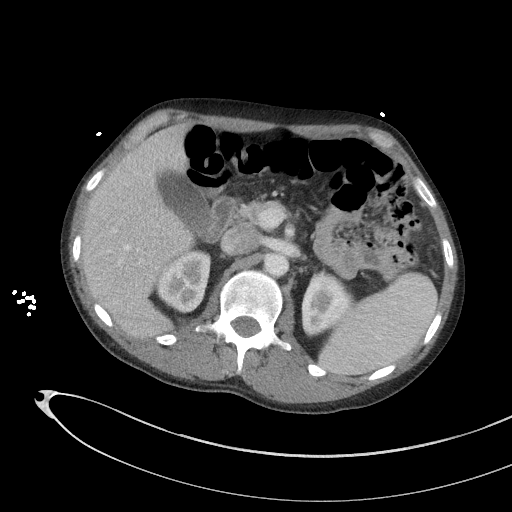
[im 63/97  bone]
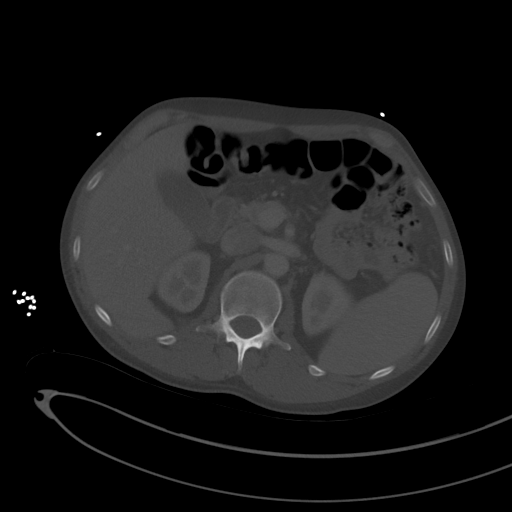
[im 71/97  soft-tissue]
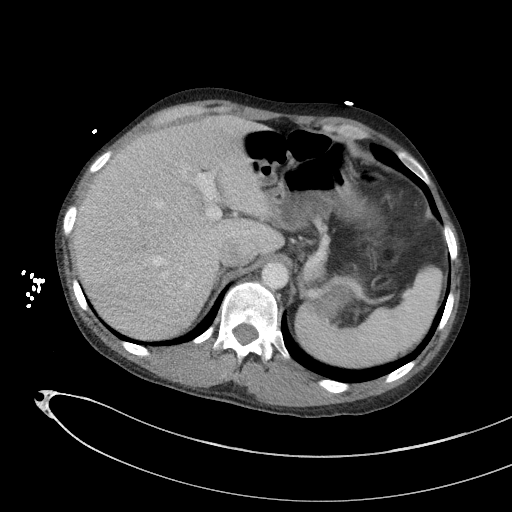
[im 76/97  soft-tissue]
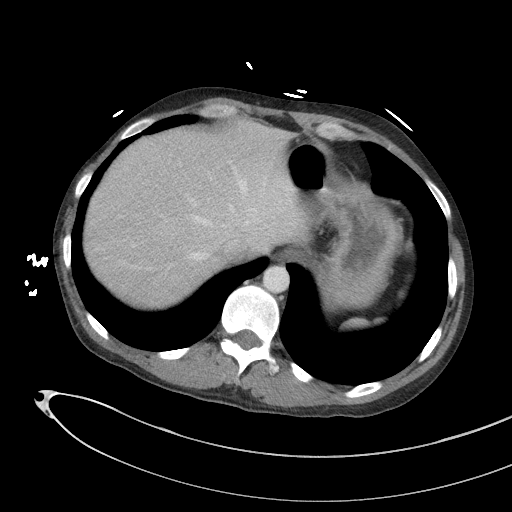
[im 84/97  soft-tissue]
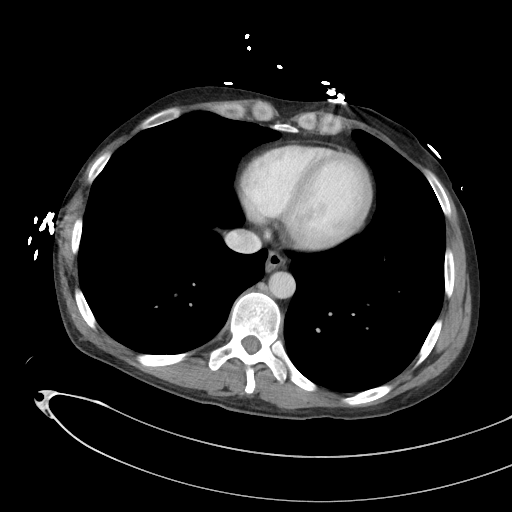
[im 92/97  soft-tissue]
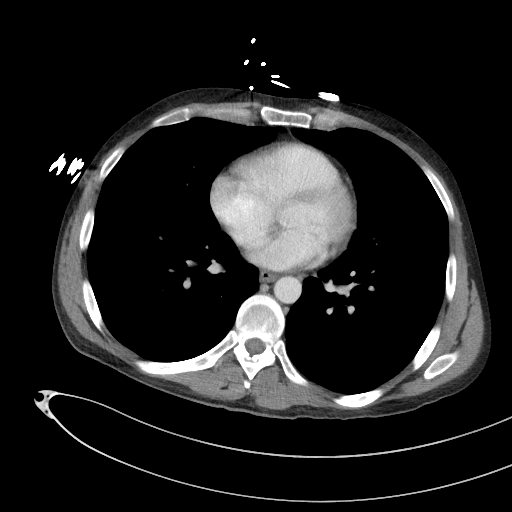

[Series 5: a/p w/ cor · coronal · 0.68mm/px · 3 of 126 slices shown]
[im 42/126  soft-tissue]
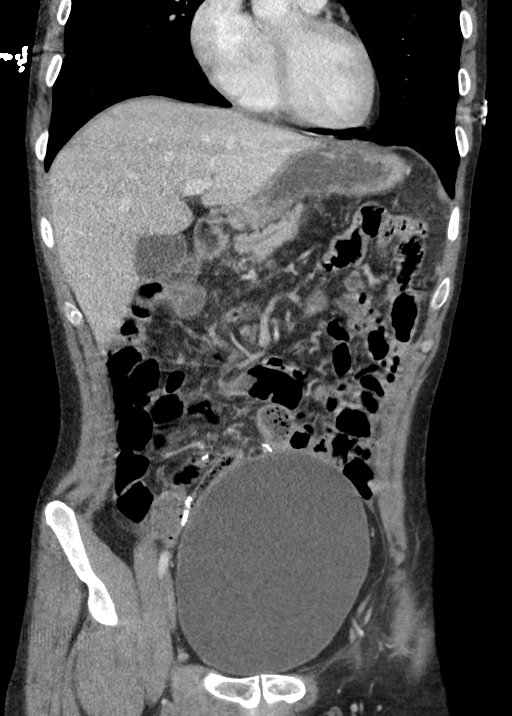
[im 56/126  soft-tissue]
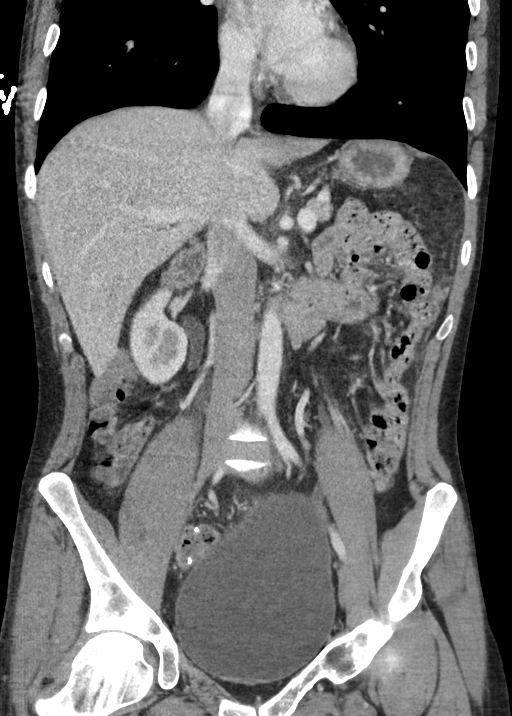
[im 70/126  soft-tissue]
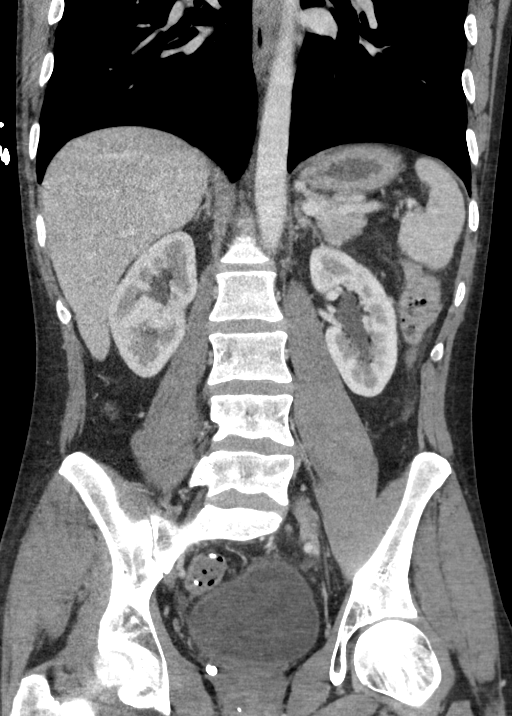

[16 of 46 positions shown; findings below may reference images not displayed]

FINDINGS: The left lower quadrant ileostomy has been surgically corrected with
presumed reanastomosis of the ileum to residual colon. There is no
obvious abscess or extraluminal bowel gas. There is no
disproportionate dilatation of bowel to suggest bowel obstruction.
The previously seen abscess within the perirectal location has
resolved. The pigtail has also been removed.

There is a suspected loop of bowel traversing posterior to the dome
of the bladder marked by arrows. It does appear somewhat narrowed
likely due to scarring.

Liver, gallbladder, spleen, pancreas, adrenal glands are within
normal limits

Mild bilateral hydronephrosis.  Bladder is distended.

No abnormal adenopathy.

No vertebral compression deformity. Chronic fracture of the right L4
inferior articular process.
IMPRESSION: Postop changes as described. Left lower quadrant ileostomy has been
taken down with reanastomosis and there is no evidence of
complication such as abscess, perforation, or bowel obstruction

Bladder remains distended with bilateral hydronephrosis.

## 2016-10-20 DIAGNOSIS — K047 Periapical abscess without sinus: Secondary | ICD-10-CM | POA: Insufficient documentation

## 2017-10-10 ENCOUNTER — Ambulatory Visit: Payer: Medicaid Other | Attending: Student | Admitting: Physical Therapy

## 2017-10-10 ENCOUNTER — Encounter: Payer: Self-pay | Admitting: Physical Therapy

## 2017-10-10 ENCOUNTER — Other Ambulatory Visit: Payer: Self-pay

## 2017-10-10 DIAGNOSIS — R279 Unspecified lack of coordination: Secondary | ICD-10-CM | POA: Insufficient documentation

## 2017-10-10 DIAGNOSIS — R252 Cramp and spasm: Secondary | ICD-10-CM | POA: Insufficient documentation

## 2017-10-10 DIAGNOSIS — M6281 Muscle weakness (generalized): Secondary | ICD-10-CM | POA: Insufficient documentation

## 2017-10-10 NOTE — Therapy (Signed)
[facility] [address], [address] [place], [place], [address] Phone: [phone]   Fax:  [phone]  Physical Therapy Evaluation  Patient Details  Name: [name] MRN: [id number] Date of Birth: 06/21/1981 Referring Provider: Dr. Zetta Bills   Encounter Date: 10/10/2017  PT End of Session - 10/10/17 2040    Visit Number  1    Date for PT Re-Evaluation  12/05/17    Authorization Type  Medicaid    PT Start Time  1445    PT Stop Time  1525    PT Time Calculation (min)  40 min    Activity Tolerance  Patient tolerated treatment well    Behavior During Therapy  [facility] for tasks assessed/performed       Past Medical History:  Diagnosis Date  . ADHD (attention deficit hyperactivity disorder)   . Anxiety   . Bipolar 1 disorder (HCC)   . Depression   . Excessive weight loss   . IBS (irritable bowel syndrome)   . Insomnia   . Ulcerative colitis     Past Surgical History:  Procedure Laterality Date  . ANAL FISSURECTOMY    . COLON SURGERY    . COLONOSCOPY W/ BIOPSIES     hx ulcerative colitis    There were no vitals filed for this visit.   Subjective Assessment - 10/10/17 1455    Subjective  Patient reports 2001 has had inflammatory disease and ulcerative colitis. Patient had 4 abdominal surgeries.  Patient has to take diahrehea and has to take medication.  Patient will go to the bathroom 10 plus times.  Patient rarely has firm bowels. Patient wears diapers. Patient has to change his diapers frequently. Patient is sensitive to his bowels.     Pertinent History  uses a walker due to dizzyness, fall risk    Patient Stated Goals  Patient learn how to have a bowel movement; reduce abdominal pain, get off diahrrehea medicine    Currently in Pain?  Yes    Pain Score  8     Pain Location  Abdomen    Pain Orientation  Anterior    Pain Descriptors / Indicators  Constant    Pain Type  Chronic pain    Pain Onset   More than a month ago    Pain Frequency  Constant    Aggravating Factors   movement, bowel movements, less sleep    Pain Relieving Factors  meditation classes, books    Multiple Pain Sites  No         [facility] PT Assessment - 10/10/17 0001      Assessment   Medical Diagnosis  K51 Ulcerative pacolitis ; R10.84 Abdominal pain    Referring Provider  Dr. Zetta Bills    Onset Date/Surgical Date  08/17/99    Prior Therapy  none      Precautions   Precautions  Fall    Precaution Comments  get dizzy so he falls      Restrictions   Weight Bearing Restrictions  No      Balance Screen   Has the patient fallen in the past 6 months  Yes    How many times?  6    Has the patient had a decrease in activity level because of a fear of falling?   Yes    Is the patient reluctant to leave their home because of a fear of falling?   Yes      Home Environment  Living Environment  Private residence      Prior Function   Level of Independence  Independent    Vocation  On disability      Cognition   Overall Cognitive Status  Within Functional Limits for tasks assessed      Observation/Other Assessments   Focus on Therapeutic Outcomes (FOTO)   Patient is limited by 60% due to fecal leakage,past medical history, and weakness from deconditioning and abdominal pain      Posture/Postural Control   Posture/Postural Control  Postural limitations    Postural Limitations  Flexed trunk      ROM / Strength   AROM / PROM / Strength  AROM;PROM;Strength      AROM   Overall AROM Comments  lumbar ROM is limited by 50%      PROM   Right Hip External Rotation   40    Left Hip External Rotation   40      Strength   Right Hip Flexion  4/5    Right Hip External Rotation   4-/5    Right Hip Internal Rotation  4-/5    Right Hip ABduction  3+/5    Right Hip ADduction  3+/5    Left Hip Flexion  4/5    Left Hip External Rotation  4-/5    Left Hip Internal Rotation  4-/5    Left Hip ABduction  3+/5     Left Hip ADduction  3/5    Right/Left Knee  Right;Left    Right Knee Flexion  3+/5    Right Knee Extension  3+/5    Left Knee Flexion  3+/5    Left Knee Extension  3+/5      Flexibility   Soft Tissue Assessment /Muscle Length  yes    Hamstrings  bilateral are tight    Quadriceps  bilaterally tight      Palpation   Palpation comment  abdomen is tender to palpation      Ambulation/Gait   Ambulation/Gait  Yes    Assistive device  Rolling walker    Gait Pattern  Decreased step length - right;Decreased step length - left;Decreased hip/knee flexion - right;Decreased hip/knee flexion - left;Decreased weight shift to right;Decreased weight shift to left             Objective measurements completed on examination: See above findings.    Pelvic Floor Special Questions - 10/10/17 0001    Currently Sexually Active  No    Urinary Leakage  No    Fecal incontinence  Yes 6 plus diapers    External Perineal Exam  bowel is located on the skin    Skin Integrity  Intact    Pelvic Floor Internal Exam  Patient confirms identification and approves PT to assess anal sphincter strength and muscle integrity and treatment    Exam Type  Rectal    Palpation  trouble relaxing to push therapist index finger out, can contract the external anal sphincter but needed many tactile cues; pain while therapist finger is placed into the anal sphincter    Strength  Flicker    Strength # of seconds  1    Tone  decreased                PT Education - 10/10/17 1526    Education provided  Yes    Education Details  information on what to expect in therapy    Person(s) Educated  Patient    Methods  Explanation  Comprehension  Verbalized understanding       PT Short Term Goals - 10/10/17 2101      PT SHORT TERM GOAL #1   Title  independent with initial HEP     Baseline  not educated yet    Time  4    Period  Weeks    Status  New    Target Date  11/07/17      PT SHORT TERM GOAL #2   Title   able to understand correct toileting technique to expel his bowels    Baseline  not educated yet    Time  4    Period  Weeks    Status  New    Target Date  11/07/17      PT SHORT TERM GOAL #3   Title  able to perform a pelvic floor contraction to begin strengthening of the pelvic floor    Baseline  not educated yet    Time  4    Period  Weeks    Status  New    Target Date  11/07/17      PT SHORT TERM GOAL #4   Title  understand correct bowel health to assist in regulating his bowels    Baseline  not educated yet    Time  4    Period  Weeks    Status  New    Target Date  11/07/17        PT Long Term Goals - 10/10/17 2108      PT LONG TERM GOAL #1   Title  independent with HEP and understand how to progress himself safely    Baseline  not educated yet    Time  8    Period  Weeks    Status  New    Target Date  12/05/17      PT LONG TERM GOAL #2   Title  ability to perform a pelvic floor contraction with a lift at strength level 3/5 to reduce fecal leakage    Baseline  pelvic floor strength is 1/5    Time  8    Period  Weeks    Status  New    Target Date  12/05/17      PT LONG TERM GOAL #3   Title  fecal leakage decreased >/= 50% so he has reduced diapers by 3 or less    Baseline  changes diapers 6 plus times per day    Time  8    Period  Weeks    Status  New    Target Date  12/05/17      PT LONG TERM GOAL #4   Title  improve strength of legs to 4/5 so he is able to walk to the bathroom in time using his rollator walker to reduce fecal leakage    Baseline  knee strength is 3+/5, hip strength 3+to 4-/5    Time  8    Period  Weeks    Status  New    Target Date  12/05/17      PT LONG TERM GOAL #5   Title  abdominal pain decreased </= 5/10 with daily tasks    Baseline  abdominal pain is 9/10    Time  8    Period  Weeks    Status  New    Target Date  12/05/17             Plan - 10/10/17 2041  Clinical Impression Statement  Patient is a 37 year old  male with chronic abdominal pain and fecal incontinence.  Patient reports his abdominal pain is 9/10 after he had abdominal surgeries to remove part of his large intestines.  Pain is decreased with meditation.  Pain is increased with movement, bowel movements, and decreased sleep.  Patient sits with his head down and a flexed trunk.  Abdomen is tender throughout.  Scars have decreased mobility.  Bilateral knee strength is 3+/5 and bilateral hip strength averages between 3+ to 4-/5.  Patient ambulates with a rollator walker due to high risk of falls from being dizzy.  Pelvic floor strength is 1/5 with  a flicker of a contraction and difficulty contracting when asked to. Patient is not able to push the therapists gloved index finger due to decreased pelvic floor muscle control. Patient has fecal staining of the anal area due to leakage.  Patient will benefit from skilled therapy to improve pelvic floor strength and coordination to reduce fecal leakage and abdominal pain.     History and Personal Factors relevant to plan of care:  bipolar 1; ADHD, Depression; IBS; 4 abdominal surgeries; Ulcerative Collitis    Clinical Presentation  Evolving    Clinical Presentation due to:  Patient has to wear diapers due to the fecal leakage; Patient has diahrrehea and goes to the bathroom 10 times plus    Clinical Decision Making  Moderate    Rehab Potential  Good    Clinical Impairments Affecting Rehab Potential  bipolar 1; ADHD, Depression; IBS; 4 abdominal surgeries; Ulcerative Collitis    PT Frequency  1x / week    PT Duration  8 weeks    PT Treatment/Interventions  Biofeedback;Electrical Stimulation;Cryotherapy;Moist Heat;Ultrasound;Therapeutic activities;Therapeutic exercise;Patient/family education;Neuromuscular re-education;Manual techniques;Scar mobilization;Dry needling    PT Next Visit Plan  gentle soft tissue work to abdomen; nustep; HEP; bowel health; adding fiber to diet    PT Home Exercise Plan  toileting  technique; gentle abdominal massage; diaphragmatic breathing    Consulted and Agree with Plan of Care  Patient       Patient will benefit from skilled therapeutic intervention in order to improve the following deficits and impairments:  Increased fascial restricitons, Pain, Decreased mobility, Decreased scar mobility, Increased muscle spasms, Decreased activity tolerance, Decreased endurance, Decreased strength, Impaired flexibility, Difficulty walking  Visit Diagnosis: Muscle weakness (generalized) - Plan: PT plan of care cert/re-cert  Unspecified lack of coordination - Plan: PT plan of care cert/re-cert  Cramp and spasm - Plan: PT plan of care cert/re-cert     Problem List Patient Active Problem List   Diagnosis Date Noted  . Hypotension 11/18/2015  . Encephalopathy acute 11/18/2015  . Bipolar disorder with psychotic features (HCC) 03/18/2015  . Feeding difficulties and mismanagement 07/20/2013  . Immunocompromised (HCC) 07/20/2013  . Protein-calorie malnutrition, severe (HCC) 07/09/2013  . EKG, abnormal 05/14/2012  . Bipolar II disorder (HCC) 05/12/2012  . Attention deficit hyperactivity disorder (ADHD) 05/12/2012  . INSOMNIA-SLEEP DISORDER-UNSPEC 05/06/2010  . HYPERCHOLESTEROLEMIA, PURE 06/14/2008  . Anxiety state 11/27/2007  . DEPRESSION 11/27/2007  . Ulcerative colitis (HCC) 04/26/2001    Eulis Foster, PT 10/10/17 9:46 PM   [facility] [address], [address] [place], [place], [address] Phone: [phone]   Fax:  [phone]  Name: Nasario Czerniak MRN: [id number] Date of Birth: 07-Jan-1981

## 2017-10-10 NOTE — Patient Instructions (Signed)
Toileting technique to have a bowel movement Strengthening of the legs and abdominals Stretching the leg Building endurance with cardio Massage of the abdomen to reduce pain  1 time per week for 3 weeks initailly  Eulis Foster, PT [facility] [address] [place], [place] [address] Phone # [phone] Fax [phone]

## 2017-10-17 ENCOUNTER — Ambulatory Visit: Payer: Medicaid Other | Admitting: Physical Therapy

## 2017-10-19 ENCOUNTER — Ambulatory Visit: Payer: Medicaid Other | Attending: Student | Admitting: Physical Therapy

## 2017-10-19 DIAGNOSIS — R252 Cramp and spasm: Secondary | ICD-10-CM | POA: Insufficient documentation

## 2017-10-19 DIAGNOSIS — M6281 Muscle weakness (generalized): Secondary | ICD-10-CM | POA: Insufficient documentation

## 2017-10-19 DIAGNOSIS — R279 Unspecified lack of coordination: Secondary | ICD-10-CM | POA: Insufficient documentation

## 2017-10-24 ENCOUNTER — Ambulatory Visit: Payer: Medicaid Other | Admitting: Physical Therapy

## 2017-10-25 ENCOUNTER — Telehealth: Payer: Self-pay | Admitting: Physical Therapy

## 2017-10-25 NOTE — Telephone Encounter (Signed)
Patient called on 10/24/2017 to apologize not coming to his last visit and he will see [place] for his visit on 10/24/2017.  Patient no showed for the visit.  Eulis Foster, PT @3 /07/2018@ 9:06 AM

## 2017-10-31 ENCOUNTER — Ambulatory Visit: Payer: Medicaid Other | Admitting: Physical Therapy

## 2017-10-31 ENCOUNTER — Encounter: Payer: Self-pay | Admitting: Physical Therapy

## 2017-10-31 DIAGNOSIS — R279 Unspecified lack of coordination: Secondary | ICD-10-CM

## 2017-10-31 DIAGNOSIS — R252 Cramp and spasm: Secondary | ICD-10-CM | POA: Diagnosis present

## 2017-10-31 DIAGNOSIS — M6281 Muscle weakness (generalized): Secondary | ICD-10-CM

## 2017-10-31 NOTE — Patient Instructions (Addendum)
Toileting Techniques for Bowel Movements (Defecation) Using your belly (abdomen) and pelvic floor muscles to have a bowel movement is usually instinctive.  Sometimes people can have problems with these muscles and have to relearn proper defecation (emptying) techniques.  If you have weakness in your muscles, organs that are falling out, decreased sensation in your pelvis, or ignore your urge to go, you may find yourself straining to have a bowel movement.  You are straining if you are: . holding your breath or taking in a huge gulp of air and holding it  . keeping your lips and jaw tensed and closed tightly . turning red in the face because of excessive pushing or forcing . developing or worsening your  hemorrhoids . getting faint while pushing . not emptying completely and have to defecate many times a day  If you are straining, you are actually making it harder for yourself to have a bowel movement.  Many people find they are pulling up with the pelvic floor muscles and closing off instead of opening the anus. Due to lack pelvic floor relaxation and coordination the abdominal muscles, one has to work harder to push the feces out.  Many people have never been taught how to defecate efficiently and effectively.  Notice what happens to your body when you are having a bowel movement.  While you are sitting on the toilet pay attention to the following areas: . Jaw and mouth position . Angle of your hips   . Whether your feet touch the ground or not . Arm placement  . Spine position . Waist . Belly tension . Anus (opening of the anal canal)  An Evacuation/Defecation Plan   Here are the 4 basic points:  1. Lean forward enough for your elbows to rest on your knees 2. Support your feet on the floor or use a low stool if your feet don't touch the floor  3. Push out your belly as if you have swallowed a beach ball-you should feel a widening of your waist 4. Open and relax your pelvic floor muscles,  rather than tightening around the anus      The following conditions my require modifications to your toileting posture:  . If you have had surgery in the past that limits your back, hip, pelvic, knee or ankle flexibility . Constipation   Your healthcare practitioner may make the following additional suggestions and adjustments:  1) Sit on the toilet  a) Make sure your feet are supported. b) Notice your hip angle and spine position-most people find it effective to lean forward or raise their knees, which can help the muscles around the anus to relax  c) When you lean forward, place your forearms on your thighs for support  2) Relax suggestions a) Breath deeply in through your nose and out slowly through your mouth as if you are smelling the flowers and blowing out the candles. b) To become aware of how to relax your muscles, contracting and releasing muscles can be helpful.  Pull your pelvic floor muscles in tightly by using the image of holding back gas, or closing around the anus (visualize making a circle smaller) and lifting the anus up and in.  Then release the muscles and your anus should drop down and feel open. Repeat 5 times ending with the feeling of relaxation. c) Keep your pelvic floor muscles relaxed; let your belly bulge out. d) The digestive tract starts at the mouth and ends at the anal opening, so be   sure to relax both ends of the tube.  Place your tongue on the roof of your mouth with your teeth separated.  This helps relax your mouth and will help to relax the anus at the same time.  3) Empty (defecation) a) Keep your pelvic floor and sphincter relaxed, then bulge your anal muscles.  Make the anal opening wide.  b) Stick your belly out as if you have swallowed a beach ball. c) Make your belly wall hard using your belly muscles while continuing to breathe. Doing this makes it easier to open your anus. d) Breath out and give a grunt (or try using other sounds such as  ahhhh, shhhhh, ohhhh or grrrrrrr).  4) Finish a) As you finish your bowel movement, pull the pelvic floor muscles up and in.  This will leave your anus in the proper place rather than remaining pushed out and down. If you leave your anus pushed out and down, it will start to feel as though that is normal and give you incorrect signals about needing to have a bowel movement.    [name], PT [facility] [address] [place], [place] [address] Squatty potty 7 inch purchase in bed bath and beyond, [employer], [email or web address]; [email or web address]  About Abdominal Massage  Abdominal massage, also called external colon massage, is a self-treatment circular massage technique that can reduce and eliminate gas and ease constipation. The colon naturally contracts in waves in a clockwise direction starting from inside the right hip, moving up toward the ribs, across the belly, and down inside the left hip.  When you perform circular abdominal massage, you help stimulate your colon's normal wave pattern of movement called peristalsis.  It is most beneficial when done after eating.  Positioning You can practice abdominal massage with oil while lying down, or in the shower with soap.  Some people find that it is just as effective to do the massage through clothing while sitting or standing.  How to Massage Start by placing your finger tips or knuckles on your right side, just inside your hip bone.  . Make small circular movements while you move upward toward your rib cage.   . Once you reach the bottom right side of your rib cage, take your circular movements across to the left side of the bottom of your rib cage.  . Next, move downward until you reach the inside of your left hip bone.  This is the path your feces travel in your colon. . Continue to perform your abdominal massage in this pattern for 10 minutes each day.     You can apply as much pressure as is comfortable in  your massage.  Start gently and build pressure as you continue to practice.  Notice any areas of pain as you massage; areas of slight pain may be relieved as you massage, but if you have areas of significant or intense pain, consult with your healthcare provider.  Other Considerations . General physical activity including bending and stretching can have a beneficial massage-like effect on the colon.  Deep breathing can also stimulate the colon because breathing deeply activates the same nervous system that supplies the colon.   . Abdominal massage should always be used in combination with a bowel-conscious diet that is high in the proper type of fiber for you, fluids (primarily water), and a regular exercise program. About Abdominal Massage  Abdominal massage, also called external colon massage, is a self-treatment circular massage technique that can  reduce and eliminate gas and ease constipation. The colon naturally contracts in waves in a clockwise direction starting from inside the right hip, moving up toward the ribs, across the belly, and down inside the left hip.  When you perform circular abdominal massage, you help stimulate your colon's normal wave pattern of movement called peristalsis.  It is most beneficial when done after eating.  Positioning You can practice abdominal massage with oil while lying down, or in the shower with soap.  Some people find that it is just as effective to do the massage through clothing while sitting or standing.  How to Massage Start by placing your finger tips or knuckles on your right side, just inside your hip bone.  . Make small circular movements while you move upward toward your rib cage.   . Once you reach the bottom right side of your rib cage, take your circular movements across to the left side of the bottom of your rib cage.  . Next, move downward until you reach the inside of your left hip bone.  This is the path your feces travel in your  colon. . Continue to perform your abdominal massage in this pattern for 10 minutes each day.     You can apply as much pressure as is comfortable in your massage.  Start gently and build pressure as you continue to practice.  Notice any areas of pain as you massage; areas of slight pain may be relieved as you massage, but if you have areas of significant or intense pain, consult with your healthcare provider.  Other Considerations . General physical activity including bending and stretching can have a beneficial massage-like effect on the colon.  Deep breathing can also stimulate the colon because breathing deeply activates the same nervous system that supplies the colon.   . Abdominal massage should always be used in combination with a bowel-conscious diet that is high in the proper type of fiber for you, fluids (primarily water), and a regular exercise program.

## 2017-10-31 NOTE — Therapy (Signed)
[facility] [address] [place], [place], [address] Phone: [phone]   Fax:  [phone]  Physical Therapy Treatment  Patient Details  Name: [name] MRN: [id number] Date of Birth: 09/19/1980 Referring Provider: Dr. Zetta Bills   Encounter Date: 10/31/2017  PT End of Session - 10/31/17 1530    Visit Number  2    Date for PT Re-Evaluation  12/05/17    Authorization Type  Medicaid 10/17/2017-11/13/2017    Authorization - Visit Number  2    Authorization - Number of Visits  4    PT Start Time  1530    PT Stop Time  1610    PT Time Calculation (min)  40 min    Activity Tolerance  Patient tolerated treatment well    Behavior During Therapy  [facility] for tasks assessed/performed       Past Medical History:  Diagnosis Date  . ADHD (attention deficit hyperactivity disorder)   . Anxiety   . Bipolar 1 disorder (HCC)   . Depression   . Excessive weight loss   . IBS (irritable bowel syndrome)   . Insomnia   . Ulcerative colitis     Past Surgical History:  Procedure Laterality Date  . ANAL FISSURECTOMY    . COLON SURGERY    . COLONOSCOPY W/ BIOPSIES     hx ulcerative colitis    There were no vitals filed for this visit.  Subjective Assessment - 10/31/17 1536    Subjective  I missed the last 2 visits due to being sick , depressed and trouble with bowels.     Pertinent History  uses a walker due to dizzyness, fall risk    Patient Stated Goals  Patient learn how to have a bowel movement; reduce abdominal pain, get off diahrrehea medicine    Currently in Pain?  Yes    Pain Score  7     Pain Location  Abdomen    Pain Orientation  Anterior    Pain Descriptors / Indicators  Constant    Pain Type  Chronic pain    Pain Onset  More than a month ago    Pain Frequency  Constant    Aggravating Factors   movement, bowel movements, less sleep    Pain Relieving Factors  meditation classess, books    Multiple  Pain Sites  No                      [facility] Adult PT Treatment/Exercise - 10/31/17 0001      Neuro Re-ed    Neuro Re-ed Details   breathing  to relax the pelvic floor prior to bowel movement      Manual Therapy   Manual Therapy  Soft tissue mobilization;Myofascial release    Soft tissue mobilization  scar massage to abdomen and rectus abdominus    Myofascial Release  lower abdomen to release the fascia from the scars             PT Education - 10/31/17 1611    Education provided  Yes    Education Details  toileting technique; abdominal massage;     Person(s) Educated  Patient    Methods  Explanation;Demonstration;Verbal cues;Handout    Comprehension  Returned demonstration;Verbalized understanding       PT Short Term Goals - 10/31/17 1614      PT SHORT TERM GOAL #1   Title  independent with initial HEP  Baseline  still learning    Time  4    Period  Weeks    Status  On-going      PT SHORT TERM GOAL #2   Title  able to understand correct toileting technique to expel his bowels    Baseline  just learned    Time  4    Period  Weeks    Status  On-going      PT SHORT TERM GOAL #3   Title  able to perform a pelvic floor contraction to begin strengthening of the pelvic floor    Baseline  not educated yet    Period  Weeks    Status  New      PT SHORT TERM GOAL #4   Title  understand correct bowel health to assist in regulating his bowels    Baseline  not educated yet    Time  4    Period  Weeks    Status  New        PT Long Term Goals - 10/10/17 2108      PT LONG TERM GOAL #1   Title  independent with HEP and understand how to progress himself safely    Baseline  not educated yet    Time  8    Period  Weeks    Status  New    Target Date  12/05/17      PT LONG TERM GOAL #2   Title  ability to perform a pelvic floor contraction with a lift at strength level 3/5 to reduce fecal leakage    Baseline  pelvic floor strength is 1/5    Time  8     Period  Weeks    Status  New    Target Date  12/05/17      PT LONG TERM GOAL #3   Title  fecal leakage decreased >/= 50% so he has reduced diapers by 3 or less    Baseline  changes diapers 6 plus times per day    Time  8    Period  Weeks    Status  New    Target Date  12/05/17      PT LONG TERM GOAL #4   Title  improve strength of legs to 4/5 so he is able to walk to the bathroom in time using his rollator walker to reduce fecal leakage    Baseline  knee strength is 3+/5, hip strength 3+to 4-/5    Time  8    Period  Weeks    Status  New    Target Date  12/05/17      PT LONG TERM GOAL #5   Title  abdominal pain decreased </= 5/10 with daily tasks    Baseline  abdominal pain is 9/10    Time  8    Period  Weeks    Status  New    Target Date  12/05/17            Plan - 10/31/17 1538    Clinical Impression Statement  Patient has missed the last 2 visits due to due to feeling sick.  Patient had increased tightness in the lower abdomen due to scars and tightness of the fascia.  Patient is able to do the breathing technique using is Apple watch. Patient will benefit from skilled therapy to improve pelvic floor strength and coordination to reduce fecal leakage and abdominal pain.     Rehab Potential  Good  Clinical Impairments Affecting Rehab Potential  bipolar 1; ADHD, Depression; IBS; 4 abdominal surgeries; Ulcerative Collitis    PT Frequency  1x / week    PT Duration  8 weeks    PT Treatment/Interventions  Biofeedback;Electrical Stimulation;Cryotherapy;Moist Heat;Ultrasound;Therapeutic activities;Therapeutic exercise;Patient/family education;Neuromuscular re-education;Manual techniques;Scar mobilization;Dry needling    PT Next Visit Plan  gentle soft tissue work to abdomen; nustep;  bowel health; adding fiber to diet    PT Home Exercise Plan  progress as needed    Recommended Other Services  MD signed initial note    Consulted and Agree with Plan of Care  Patient        Patient will benefit from skilled therapeutic intervention in order to improve the following deficits and impairments:  Increased fascial restricitons, Pain, Decreased mobility, Decreased scar mobility, Increased muscle spasms, Decreased activity tolerance, Decreased endurance, Decreased strength, Impaired flexibility, Difficulty walking  Visit Diagnosis: Muscle weakness (generalized)  Unspecified lack of coordination  Cramp and spasm     Problem List Patient Active Problem List   Diagnosis Date Noted  . Hypotension 11/18/2015  . Encephalopathy acute 11/18/2015  . Bipolar disorder with psychotic features (HCC) 03/18/2015  . Feeding difficulties and mismanagement 07/20/2013  . Immunocompromised (HCC) 07/20/2013  . Protein-calorie malnutrition, severe (HCC) 07/09/2013  . EKG, abnormal 05/14/2012  . Bipolar II disorder (HCC) 05/12/2012  . Attention deficit hyperactivity disorder (ADHD) 05/12/2012  . INSOMNIA-SLEEP DISORDER-UNSPEC 05/06/2010  . HYPERCHOLESTEROLEMIA, PURE 06/14/2008  . Anxiety state 11/27/2007  . DEPRESSION 11/27/2007  . Ulcerative colitis (HCC) 04/26/2001    Eulis Foster, PT 10/31/17 4:16 PM   [facility] [address] [place], [place], [address] Phone: [phone]   Fax:  [phone]  Name: [name] MRN: [id number] Date of Birth: 31-Jul-1981

## 2017-11-10 ENCOUNTER — Ambulatory Visit: Payer: Medicaid Other | Admitting: Physical Therapy

## 2017-11-23 ENCOUNTER — Ambulatory Visit: Payer: Medicaid Other | Attending: Student | Admitting: Physical Therapy

## 2017-12-24 DIAGNOSIS — Z599 Problem related to housing and economic circumstances, unspecified: Secondary | ICD-10-CM | POA: Insufficient documentation

## 2017-12-24 DIAGNOSIS — Z598 Other problems related to housing and economic circumstances: Secondary | ICD-10-CM | POA: Insufficient documentation

## 2017-12-26 ENCOUNTER — Ambulatory Visit: Payer: Medicaid Other | Attending: Student | Admitting: Physical Therapy

## 2017-12-26 ENCOUNTER — Encounter: Payer: Self-pay | Admitting: Physical Therapy

## 2017-12-26 DIAGNOSIS — R279 Unspecified lack of coordination: Secondary | ICD-10-CM | POA: Diagnosis present

## 2017-12-26 DIAGNOSIS — R252 Cramp and spasm: Secondary | ICD-10-CM | POA: Diagnosis present

## 2017-12-26 DIAGNOSIS — M6281 Muscle weakness (generalized): Secondary | ICD-10-CM | POA: Diagnosis not present

## 2017-12-26 NOTE — Patient Instructions (Addendum)
Toileting Techniques for Bowel Movements (Defecation) Using your belly (abdomen) and pelvic floor muscles to have a bowel movement is usually instinctive.  Sometimes people can have problems with these muscles and have to relearn proper defecation (emptying) techniques.  If you have weakness in your muscles, organs that are falling out, decreased sensation in your pelvis, or ignore your urge to go, you may find yourself straining to have a bowel movement.  You are straining if you are: . holding your breath or taking in a huge gulp of air and holding it  . keeping your lips and jaw tensed and closed tightly . turning red in the face because of excessive pushing or forcing . developing or worsening your  hemorrhoids . getting faint while pushing . not emptying completely and have to defecate many times a day  If you are straining, you are actually making it harder for yourself to have a bowel movement.  Many people find they are pulling up with the pelvic floor muscles and closing off instead of opening the anus. Due to lack pelvic floor relaxation and coordination the abdominal muscles, one has to work harder to push the feces out.  Many people have never been taught how to defecate efficiently and effectively.  Notice what happens to your body when you are having a bowel movement.  While you are sitting on the toilet pay attention to the following areas: . Jaw and mouth position . Angle of your hips   . Whether your feet touch the ground or not . Arm placement  . Spine position . Waist . Belly tension . Anus (opening of the anal canal)  An Evacuation/Defecation Plan   Here are the 4 basic points:  1. Lean forward enough for your elbows to rest on your knees 2. Support your feet on the floor or use a low stool if your feet don't touch the floor  3. Push out your belly as if you have swallowed a beach ball-you should feel a widening of your waist 4. Open and relax your pelvic floor muscles,  rather than tightening around the anus      The following conditions my require modifications to your toileting posture:  . If you have had surgery in the past that limits your back, hip, pelvic, knee or ankle flexibility . Constipation   Your healthcare practitioner may make the following additional suggestions and adjustments:  1) Sit on the toilet  a) Make sure your feet are supported. b) Notice your hip angle and spine position-most people find it effective to lean forward or raise their knees, which can help the muscles around the anus to relax  c) When you lean forward, place your forearms on your thighs for support  2) Relax suggestions a) Breath deeply in through your nose and out slowly through your mouth as if you are smelling the flowers and blowing out the candles. b) To become aware of how to relax your muscles, contracting and releasing muscles can be helpful.  Pull your pelvic floor muscles in tightly by using the image of holding back gas, or closing around the anus (visualize making a circle smaller) and lifting the anus up and in.  Then release the muscles and your anus should drop down and feel open. Repeat 5 times ending with the feeling of relaxation. c) Keep your pelvic floor muscles relaxed; let your belly bulge out. d) The digestive tract starts at the mouth and ends at the anal opening, so be   sure to relax both ends of the tube.  Place your tongue on the roof of your mouth with your teeth separated.  This helps relax your mouth and will help to relax the anus at the same time.  3) Empty (defecation) a) Keep your pelvic floor and sphincter relaxed, then bulge your anal muscles.  Make the anal opening wide.  b) Stick your belly out as if you have swallowed a beach ball. c) Make your belly wall hard using your belly muscles while continuing to breathe. Doing this makes it easier to open your anus. d) Breath out and give a grunt (or try using other sounds such as  ahhhh, shhhhh, ohhhh or grrrrrrr).  4) Finish a) As you finish your bowel movement, pull the pelvic floor muscles up and in.  This will leave your anus in the proper place rather than remaining pushed out and down. If you leave your anus pushed out and down, it will start to feel as though that is normal and give you incorrect signals about needing to have a bowel movement.    About Abdominal Massage  Abdominal massage, also called external colon massage, is a self-treatment circular massage technique that can reduce and eliminate gas and ease constipation. The colon naturally contracts in waves in a clockwise direction starting from inside the right hip, moving up toward the ribs, across the belly, and down inside the left hip.  When you perform circular abdominal massage, you help stimulate your colon's normal wave pattern of movement called peristalsis.  It is most beneficial when done after eating.  Positioning You can practice abdominal massage with oil while lying down, or in the shower with soap.  Some people find that it is just as effective to do the massage through clothing while sitting or standing.  How to Massage Start by placing your finger tips or knuckles on your right side, just inside your hip bone.  . Make small circular movements while you move upward toward your rib cage.   . Once you reach the bottom right side of your rib cage, take your circular movements across to the left side of the bottom of your rib cage.  . Next, move downward until you reach the inside of your left hip bone.  This is the path your feces travel in your colon. . Continue to perform your abdominal massage in this pattern for 10 minutes each day.     You can apply as much pressure as is comfortable in your massage.  Start gently and build pressure as you continue to practice.  Notice any areas of pain as you massage; areas of slight pain may be relieved as you massage, but if you have areas of  significant or intense pain, consult with your healthcare provider.  Other Considerations . General physical activity including bending and stretching can have a beneficial massage-like effect on the colon.  Deep breathing can also stimulate the colon because breathing deeply activates the same nervous system that supplies the colon.   . Abdominal massage should always be used in combination with a bowel-conscious diet that is high in the proper type of fiber for you, fluids (primarily water), and a regular exercise program.   Bracing With Bridging (Hook-Lying)    With neutral spine, tighten pelvic floor and abdominals and hold. Lift bottom. Repeat _15__ times. Do _1__ times a day.   Copyright  VHI. All rights reserved.  Bracing With Arm Lift (Hook-Lying)    With neutral  spine, tighten pelvic floor and abdominals and hold. Alternating arms, raise over head and return to side. Repeat _10__ times. Do __1_ times a day.   Copyright  VHI. All rights reserved.    Bracing With Knee Fallout (Hook-Lying)    With neutral spine, tighten pelvic floor and abdominals and hold. Alternating legs, drop knee out to side. Keep opposite hip still. Repeat _20__ times. Do _1__ times a day.   Copyright  VHI. All rights reserved.  Unilateral Isometric Hip Flexion    Tighten stomach and pelvic floor and raise right knee to outstretched arm. Push gently, keeping arm straight, trunk rigid. Hold __5__ seconds. Repeat _10___ times per set. Do ___1_ sets per session. Do _1___ sessions per day.  http://orth.exer.us/1098   Copyright  VHI. All rights reserved.  Slow Contraction: Gravity Eliminated (Hook-Lying)    Lie with hips and knees bent. Slowly squeeze pelvic floor for __5_ seconds. Rest for _5__ seconds. Repeat _10__ times. Do _3__ times a day.   Copyright  VHI. All rights reserved.  [facility] [address], [address] [place], [place] [address] Phone # [phone] Fax  [phone]

## 2017-12-26 NOTE — Therapy (Signed)
[facility] [address], [address] [place], [place], [address] Phone: [phone]   Fax:  [phone]  Physical Therapy Treatment  Patient Details  Name: [name] MRN: [id number] Date of Birth: [birth year] Referring Provider: Dr. Zetta Bills   Encounter Date: 12/26/2017  PT End of Session - 12/26/17 1522    Visit Number  3    Date for PT Re-Evaluation  02/13/18    Authorization Type  Medicaid 11/22/2017-02/13/2018    Authorization - Visit Number  1    Authorization - Number of Visits  12    PT Start Time  1458 patient came late    PT Stop Time  1530    PT Time Calculation (min)  32 min    Activity Tolerance  Patient tolerated treatment well    Behavior During Therapy  [facility] for tasks assessed/performed       Past Medical History:  Diagnosis Date  . ADHD (attention deficit hyperactivity disorder)   . Anxiety   . Bipolar 1 disorder (HCC)   . Depression   . Excessive weight loss   . IBS (irritable bowel syndrome)   . Insomnia   . Ulcerative colitis     Past Surgical History:  Procedure Laterality Date  . ANAL FISSURECTOMY    . COLON SURGERY    . COLONOSCOPY W/ BIOPSIES     hx ulcerative colitis    There were no vitals filed for this visit.  Subjective Assessment - 12/26/17 1456    Subjective  I missed a few visits due bowel issues.     Pertinent History  uses a walker due to dizzyness, fall risk    Patient Stated Goals  Patient learn how to have a bowel movement; reduce abdominal pain, get off diahrrehea medicine    Currently in Pain?  Yes    Pain Score  7     Pain Location  Abdomen    Pain Orientation  Anterior    Pain Descriptors / Indicators  Constant    Pain Type  Chronic pain    Pain Onset  More than a month ago    Pain Frequency  Constant    Aggravating Factors   movement, bowel movements, less sleep    Pain Relieving Factors  mediatation classes, books    Multiple Pain Sites  No          [facility] PT Assessment - 12/26/17 0001      Assessment   Medical Diagnosis  K51 Ulcerative pacolitis ; R10.84 Abdominal pain    Referring Provider  Dr. Zetta Bills    Onset Date/Surgical Date  08/17/99    Prior Therapy  none      Precautions   Precautions  Fall    Precaution Comments  get dizzy so he falls      Restrictions   Weight Bearing Restrictions  No      Balance Screen   Has the patient fallen in the past 6 months  Yes    How many times?  6    Has the patient had a decrease in activity level because of a fear of falling?   Yes    Is the patient reluctant to leave their home because of a fear of falling?   Yes      Home Environment   Living Environment  Private residence      Prior Function   Level of Independence  Independent    Vocation  On disability  Cognition   Overall Cognitive Status  Within Functional Limits for tasks assessed      Observation/Other Assessments   Focus on Therapeutic Outcomes (FOTO)   Patient is limited by 60% due to fecal leakage,past medical history, and weakness from deconditioning and abdominal pain      Posture/Postural Control   Posture/Postural Control  Postural limitations    Postural Limitations  Flexed trunk      AROM   Overall AROM Comments  lumbar ROM is limited by 50%      PROM   Right Hip External Rotation   40    Left Hip External Rotation   40      Strength   Right Hip Flexion  4/5    Right Hip External Rotation   4-/5    Right Hip Internal Rotation  4-/5    Right Hip ABduction  3+/5    Right Hip ADduction  3+/5    Left Hip Flexion  4/5    Left Hip External Rotation  4-/5    Left Hip Internal Rotation  4-/5    Left Hip ABduction  3+/5    Left Hip ADduction  3/5    Right Knee Flexion  3+/5    Right Knee Extension  3+/5    Left Knee Flexion  3+/5    Left Knee Extension  3+/5      Flexibility   Soft Tissue Assessment /Muscle Length  yes    Hamstrings  bilateral are tight    Quadriceps  bilaterally  tight      Palpation   Palpation comment  abdomen is tender to palpation      Ambulation/Gait   Ambulation/Gait  Yes    Assistive device  Rolling walker    Gait Pattern  Decreased step length - right;Decreased step length - left;Decreased hip/knee flexion - right;Decreased hip/knee flexion - left;Decreased weight shift to right;Decreased weight shift to left                Pelvic Floor Special Questions - 12/26/17 0001    Palpation  trouble relaxing to push therapist index finger out, can contract the external anal sphincter but needed many tactile cues; pain while therapist finger is placed into the anal sphincter    Strength  Flicker    Strength # of seconds  1    Tone  decreased        [facility] Adult PT Treatment/Exercise - 12/26/17 0001      Self-Care   Self-Care  Other Self-Care Comments    Other Self-Care Comments   abdominal massage to improve movement of large intestines      Therapeutic Activites    Therapeutic Activities  Other Therapeutic Activities    Other Therapeutic Activities  toileting technique  with breathing and posture      Lumbar Exercises: Supine   Bridge  15 reps             PT Education - 12/26/17 1521    Education provided  Yes    Education Details  toileting technique, abdominal massage; core strength; pelvic floor contraction    Person(s) Educated  Patient    Methods  Explanation;Demonstration;Verbal cues;Handout    Comprehension  Returned demonstration;Verbalized understanding       PT Short Term Goals - 10/31/17 1614      PT SHORT TERM GOAL #1   Title  independent with initial HEP     Baseline  still learning    Time  4    Period  Weeks    Status  On-going      PT SHORT TERM GOAL #2   Title  able to understand correct toileting technique to expel his bowels    Baseline  just learned    Time  4    Period  Weeks    Status  On-going      PT SHORT TERM GOAL #3   Title  able to perform a pelvic floor contraction to  begin strengthening of the pelvic floor    Baseline  not educated yet    Period  Weeks    Status  New      PT SHORT TERM GOAL #4   Title  understand correct bowel health to assist in regulating his bowels    Baseline  not educated yet    Time  4    Period  Weeks    Status  New        PT Long Term Goals - 10/10/17 2108      PT LONG TERM GOAL #1   Title  independent with HEP and understand how to progress himself safely    Baseline  not educated yet    Time  8    Period  Weeks    Status  New    Target Date  12/05/17      PT LONG TERM GOAL #2   Title  ability to perform a pelvic floor contraction with a lift at strength level 3/5 to reduce fecal leakage    Baseline  pelvic floor strength is 1/5    Time  8    Period  Weeks    Status  New    Target Date  12/05/17      PT LONG TERM GOAL #3   Title  fecal leakage decreased >/= 50% so he has reduced diapers by 3 or less    Baseline  changes diapers 6 plus times per day    Time  8    Period  Weeks    Status  New    Target Date  12/05/17      PT LONG TERM GOAL #4   Title  improve strength of legs to 4/5 so he is able to walk to the bathroom in time using his rollator walker to reduce fecal leakage    Baseline  knee strength is 3+/5, hip strength 3+to 4-/5    Time  8    Period  Weeks    Status  New    Target Date  12/05/17      PT LONG TERM GOAL #5   Title  abdominal pain decreased </= 5/10 with daily tasks    Baseline  abdominal pain is 9/10    Time  8    Period  Weeks    Status  New    Target Date  12/05/17            Plan - 12/26/17 1617    Clinical Impression Statement  Patient has only attended therapy for 2 sessions.  He has difficulty with attending therapy due to him being sick and difficulty with abdominal pain and discomfort.  Patient has many bowel movements per day making it difficult to attend therapy.  Bilateral lower extermity strength averages 3-4/5.  Patient needs therapist to go over his HEP due  to difficulty remembering them.  Patient has difficulty with coordinating his abdominal contraction with his HEP.  Patient feels he is contracting  the pelvic floor but next visit we will do pelvic floor EMG to assist him in contracting the pelvic floor correctly.  Patient continues to have frequent bowel movements and bowel accidents. Patient will benefit from skilled therapy to improve strength and reduce discomfort of the abdomen and improve continence.     Rehab Potential  Good    Clinical Impairments Affecting Rehab Potential  bipolar 1; ADHD, Depression; IBS; 4 abdominal surgeries; Ulcerative Collitis    PT Frequency  1x / week    PT Duration  Other (comment) 11 weeks    PT Treatment/Interventions  Biofeedback;Electrical Stimulation;Cryotherapy;Moist Heat;Ultrasound;Therapeutic activities;Therapeutic exercise;Patient/family education;Neuromuscular re-education;Manual techniques;Scar mobilization;Dry needling    PT Next Visit Plan  gentle soft tissue work to abdomen; nustep;  bowel health; adding fiber to diet    PT Home Exercise Plan  progress as needed    Recommended Other Services  sent MD renewal note on 12/26/2017    Consulted and Agree with Plan of Care  Patient       Patient will benefit from skilled therapeutic intervention in order to improve the following deficits and impairments:  Increased fascial restricitons, Pain, Decreased mobility, Decreased scar mobility, Increased muscle spasms, Decreased activity tolerance, Decreased endurance, Decreased strength, Impaired flexibility, Difficulty walking  Visit Diagnosis: Muscle weakness (generalized)  Unspecified lack of coordination  Cramp and spasm     Problem List Patient Active Problem List   Diagnosis Date Noted  . Hypotension 11/18/2015  . Encephalopathy acute 11/18/2015  . Bipolar disorder with psychotic features (HCC) 03/18/2015  . Feeding difficulties and mismanagement 07/20/2013  . Immunocompromised (HCC) 07/20/2013   . Protein-calorie malnutrition, severe (HCC) 07/09/2013  . EKG, abnormal 05/14/2012  . Bipolar II disorder (HCC) 05/12/2012  . Attention deficit hyperactivity disorder (ADHD) 05/12/2012  . INSOMNIA-SLEEP DISORDER-UNSPEC 05/06/2010  . HYPERCHOLESTEROLEMIA, PURE 06/14/2008  . Anxiety state 11/27/2007  . DEPRESSION 11/27/2007  . Ulcerative colitis (HCC) 04/26/2001    Eulis Foster, PT 12/26/17 4:24 PM   [facility] [address] [place], [place], [address] Phone: [phone]   Fax:  [phone]  Name: Trevor Wilkie MRN: [id number] Date of Birth: [birth year]

## 2018-01-16 ENCOUNTER — Ambulatory Visit: Payer: Medicaid Other | Admitting: Physical Therapy

## 2018-01-25 ENCOUNTER — Ambulatory Visit: Payer: Medicaid Other | Attending: Student | Admitting: Physical Therapy

## 2018-01-25 ENCOUNTER — Encounter: Payer: Self-pay | Admitting: Physical Therapy

## 2018-01-25 DIAGNOSIS — R279 Unspecified lack of coordination: Secondary | ICD-10-CM | POA: Diagnosis present

## 2018-01-25 DIAGNOSIS — R252 Cramp and spasm: Secondary | ICD-10-CM | POA: Diagnosis present

## 2018-01-25 DIAGNOSIS — M6281 Muscle weakness (generalized): Secondary | ICD-10-CM

## 2018-01-25 NOTE — Therapy (Signed)
[facility] [address], [address] [place], [place], [address] Phone: [phone]   Fax:  [phone]  Physical Therapy Treatment  Patient Details  Name: [name] MRN: [id number] Date of Birth: May 13, 1981 Referring Provider: Dr. Zetta Bills   Encounter Date: 01/25/2018  PT End of Session - 01/25/18 1455    Visit Number  4    Date for PT Re-Evaluation  02/13/18    Authorization Type  Medicaid 11/22/2017-02/13/2018    Authorization - Visit Number  4    Authorization - Number of Visits  12    PT Start Time  1445    PT Stop Time  1525    PT Time Calculation (min)  40 min    Activity Tolerance  Patient tolerated treatment well    Behavior During Therapy  [facility] for tasks assessed/performed       Past Medical History:  Diagnosis Date  . ADHD (attention deficit hyperactivity disorder)   . Anxiety   . Bipolar 1 disorder (HCC)   . Depression   . Excessive weight loss   . IBS (irritable bowel syndrome)   . Insomnia   . Ulcerative colitis     Past Surgical History:  Procedure Laterality Date  . ANAL FISSURECTOMY    . COLON SURGERY    . COLONOSCOPY W/ BIOPSIES     hx ulcerative colitis    There were no vitals filed for this visit.  Subjective Assessment - 01/25/18 1450    Subjective  Squatty potty helps things flow better. Fecal leakage is the same. Abdominal pain is 7/10. I will have to start to go to the food pantry due to trouble with credit cards.     Pertinent History  uses a walker due to dizzyness, fall risk    Patient Stated Goals  Patient learn how to have a bowel movement; reduce abdominal pain, get off diahrrehea medicine    Currently in Pain?  Yes    Pain Score  7     Pain Location  Abdomen    Pain Orientation  Anterior    Pain Descriptors / Indicators  Constant    Pain Type  Chronic pain    Pain Frequency  Constant    Aggravating Factors   movement, bowel movements, less sleep    Pain  Relieving Factors  mediatation classes, books    Multiple Pain Sites  No         [facility] PT Assessment - 01/25/18 0001      PROM   Right Hip External Rotation   40    Left Hip External Rotation   40      Strength   Right Hip Flexion  4/5    Right Hip External Rotation   4/5    Right Hip Internal Rotation  4/5    Right Hip ABduction  3+/5    Right Hip ADduction  3+/5    Left Hip Flexion  4/5    Left Hip External Rotation  4/5    Left Hip Internal Rotation  4/5    Left Hip ABduction  3+/5    Left Hip ADduction  3/5    Right Knee Flexion  3+/5    Right Knee Extension  4-/5    Left Knee Flexion  3+/5    Left Knee Extension  4-/5                   [facility] Adult PT Treatment/Exercise - 01/25/18  0001      Lumbar Exercises: Supine   Clam  15 reps;1 second bilateral sides    Bridge  15 reps breath out as lift hips    Isometric Hip Flexion  10 reps;5 seconds bil. abdominal bracing    Other Supine Lumbar Exercises  hookly alternate shoulder flexion with pelvic floor contraction 20 times             PT Education - 01/25/18 1524    Education provided  Yes    Education Details  abdominal contraction with pelvic floor contraction    Person(s) Educated  Patient    Methods  Explanation;Demonstration;Verbal cues;Handout    Comprehension  Returned demonstration;Verbalized understanding       PT Short Term Goals - 01/25/18 1457      PT SHORT TERM GOAL #1   Title  independent with initial HEP     Time  4    Period  Weeks    Status  Achieved      PT SHORT TERM GOAL #2   Title  able to understand correct toileting technique to expel his bowels    Time  4    Period  Weeks    Status  Achieved      PT SHORT TERM GOAL #3   Title  able to perform a pelvic floor contraction to begin strengthening of the pelvic floor    Time  4    Period  Weeks    Status  Achieved      PT SHORT TERM GOAL #4   Title  understand correct bowel health to assist in regulating his  bowels    Time  4    Period  Weeks    Status  Achieved        PT Long Term Goals - 10/10/17 2108      PT LONG TERM GOAL #1   Title  independent with HEP and understand how to progress himself safely    Baseline  not educated yet    Time  8    Period  Weeks    Status  New    Target Date  12/05/17      PT LONG TERM GOAL #2   Title  ability to perform a pelvic floor contraction with a lift at strength level 3/5 to reduce fecal leakage    Baseline  pelvic floor strength is 1/5    Time  8    Period  Weeks    Status  New    Target Date  12/05/17      PT LONG TERM GOAL #3   Title  fecal leakage decreased >/= 50% so he has reduced diapers by 3 or less    Baseline  changes diapers 6 plus times per day    Time  8    Period  Weeks    Status  New    Target Date  12/05/17      PT LONG TERM GOAL #4   Title  improve strength of legs to 4/5 so he is able to walk to the bathroom in time using his rollator walker to reduce fecal leakage    Baseline  knee strength is 3+/5, hip strength 3+to 4-/5    Time  8    Period  Weeks    Status  New    Target Date  12/05/17      PT LONG TERM GOAL #5   Title  abdominal pain decreased </= 5/10 with  daily tasks    Baseline  abdominal pain is 9/10    Time  8    Period  Weeks    Status  New    Target Date  12/05/17            Plan - 01/25/18 1453    Clinical Impression Statement  Patient has increased in bilateral hip flexion and knee extension strength. Patient reports his stools are coming out easier.  Patient feel leakage is the same.  Patient was holding his head down 50% of treatment due to feeling tired.  Patient needed constant verbal cues to perform the execise due to stopping.  Patient is doing his abodminal massage at home but not consistent of home exercise.  Patient will benefit from skilled therapy to improve strength and reduce discomfort of the abdoment and improve continence.     Rehab Potential  Good    Clinical Impairments  Affecting Rehab Potential  bipolar 1; ADHD, Depression; IBS; 4 abdominal surgeries; Ulcerative Collitis    PT Frequency  1x / week    PT Duration  Other (comment) 11 weeks    PT Treatment/Interventions  Biofeedback;Electrical Stimulation;Cryotherapy;Moist Heat;Ultrasound;Therapeutic activities;Therapeutic exercise;Patient/family education;Neuromuscular re-education;Manual techniques;Scar mobilization;Dry needling    PT Next Visit Plan  gentle soft tissue work to abdomen; nustep;  bowel health; adding fiber to diet; medicaid auth ends on 7/1 decide if D/C or renewal    PT Home Exercise Plan  progress as needed    Consulted and Agree with Plan of Care  Patient       Patient will benefit from skilled therapeutic intervention in order to improve the following deficits and impairments:  Increased fascial restricitons, Pain, Decreased mobility, Decreased scar mobility, Increased muscle spasms, Decreased activity tolerance, Decreased endurance, Decreased strength, Impaired flexibility, Difficulty walking  Visit Diagnosis: Muscle weakness (generalized)  Unspecified lack of coordination  Cramp and spasm     Problem List Patient Active Problem List   Diagnosis Date Noted  . Hypotension 11/18/2015  . Encephalopathy acute 11/18/2015  . Bipolar disorder with psychotic features (HCC) 03/18/2015  . Feeding difficulties and mismanagement 07/20/2013  . Immunocompromised (HCC) 07/20/2013  . Protein-calorie malnutrition, severe (HCC) 07/09/2013  . EKG, abnormal 05/14/2012  . Bipolar II disorder (HCC) 05/12/2012  . Attention deficit hyperactivity disorder (ADHD) 05/12/2012  . INSOMNIA-SLEEP DISORDER-UNSPEC 05/06/2010  . HYPERCHOLESTEROLEMIA, PURE 06/14/2008  . Anxiety state 11/27/2007  . DEPRESSION 11/27/2007  . Ulcerative colitis (HCC) 04/26/2001    Eulis Foster, PT 01/25/18 3:29 PM   [facility] [address], [address] [place],  [place], [address] Phone: [phone]   Fax:  [phone]  Name: [name] MRN: [id number] Date of Birth: [birth year]

## 2018-01-25 NOTE — Patient Instructions (Addendum)
External Rotation: Hip - Knees Apart (Sitting)    Sit, band tied just above knees. Pull knees apart. Hold for _5_ seconds. Rest for _5__ seconds. Repeat __15_ times. Do _1__ times a day.  Copyright  VHI. All rights reserved.    Adduction: Hip - Knees Together (Sitting)    Sit with towel roll between knees. Push knees together. Hold for _5_ seconds. Rest for __5_ seconds. Repeat _15__ times. Do __1_ times a day.  Copyright  VHI. All rights reserved.  Bracing With Bridging (Hook-Lying)    With neutral spine, tighten pelvic floor and abdominals and hold. Lift bottom. Breathe out Repeat _10__ times. Do _1__ times a day.   Copyright  VHI. All rights reserved.  Bracing With Arm Lift (Hook-Lying)    With neutral spine, tighten pelvic floor and abdominals and hold. Alternating arms, raise over head and return to side. Repeat _10__ times. Do __1_ times a day.   Copyright  VHI. All rights reserved.   Bracing With Knee Fallout (Hook-Lying)    With neutral spine, tighten pelvic floor and abdominals and hold. Alternating legs, drop knee out to side. Keep opposite hip still. Repeat _20__ times. Do _1__ times a day.   Copyright  VHI. All rights reserved.   Unilateral Isometric Hip Flexion    Tighten stomach and pelvic floor and raise right knee to outstretched arm. Push gently, keeping arm straight, trunk rigid. Hold __5__ seconds. Repeat _10___ times per set. Do ___1_ sets per session. Do _1___ sessions per day. Make sure you are tightening the stomach. Breathe http://orth.exer.us/1098   Copyright  VHI. All rights reserved.  [facility] [address], [address] [place], [place] [address] Phone # [phone] Fax [phone]

## 2018-02-01 ENCOUNTER — Ambulatory Visit: Payer: Medicaid Other | Admitting: Physical Therapy

## 2018-02-01 ENCOUNTER — Encounter: Payer: Self-pay | Admitting: Physical Therapy

## 2018-02-01 DIAGNOSIS — M6281 Muscle weakness (generalized): Secondary | ICD-10-CM

## 2018-02-01 DIAGNOSIS — R252 Cramp and spasm: Secondary | ICD-10-CM

## 2018-02-01 DIAGNOSIS — R279 Unspecified lack of coordination: Secondary | ICD-10-CM

## 2018-02-01 NOTE — Therapy (Signed)
[facility] [address], [place], [place], [address] Phone: [phone]   Fax:  [phone]  Physical Therapy Treatment  Patient Details  Name: [name] MRN: [id number] Date of Birth: 20-Apr-1981 Referring Provider: Dr. Brayton Layman   Encounter Date: 02/01/2018  PT End of Session - 02/01/18 1501    Visit Number  5    Date for PT Re-Evaluation  02/13/18    Authorization Type  Medicaid 11/22/2017-02/13/2018    Authorization - Visit Number  5    Authorization - Number of Visits  12    PT Start Time  3329 came late and had to go to bathroom    PT Stop Time  1530    PT Time Calculation (min)  35 min    Activity Tolerance  Patient tolerated treatment well    Behavior During Therapy  [facility] for tasks assessed/performed       Past Medical History:  Diagnosis Date  . ADHD (attention deficit hyperactivity disorder)   . Anxiety   . Bipolar 1 disorder ([place])   . Depression   . Excessive weight loss   . IBS (irritable bowel syndrome)   . Insomnia   . Ulcerative colitis     Past Surgical History:  Procedure Laterality Date  . ANAL FISSURECTOMY    . COLON SURGERY    . COLONOSCOPY W/ BIOPSIES     hx ulcerative colitis    There were no vitals filed for this visit.  Subjective Assessment - 02/01/18 1459    Subjective  I have been doing the exercises. I still have fecal leakage. I do not having control with the diarrhea. The squatty potty helps with the bowel movements and easier to push out.     Pertinent History  uses a walker due to dizzyness, fall risk    Patient Stated Goals  Patient learn how to have a bowel movement; reduce abdominal pain, get off diahrrehea medicine    Currently in Pain?  Yes    Pain Score  9     Pain Location  Abdomen    Pain Orientation  Anterior    Pain Descriptors / Indicators  Constant    Pain Type  Chronic pain    Pain Onset  More than a month ago    Pain Frequency  Constant     Aggravating Factors   movement, bowel movement, less sleep    Pain Relieving Factors  mediatation classess, books    Multiple Pain Sites  No                       [facility] Adult PT Treatment/Exercise - 02/01/18 0001      Manual Therapy   Manual Therapy  Soft tissue mobilization    Soft tissue mobilization  scar massage to right lower quadrant area with myofascial release             PT Education - 02/01/18 1528    Education provided  Yes    Education Details  Access Code: [email or web address]     Person(s) Educated  Patient    Methods  Explanation;Demonstration;Handout    Comprehension  Verbalized understanding;Returned demonstration       PT Short Term Goals - 01/25/18 1457      PT SHORT TERM GOAL #1   Title  independent with initial HEP     Time  4    Period  Weeks    Status  Achieved  PT SHORT TERM GOAL #2   Title  able to understand correct toileting technique to expel his bowels    Time  4    Period  Weeks    Status  Achieved      PT SHORT TERM GOAL #3   Title  able to perform a pelvic floor contraction to begin strengthening of the pelvic floor    Time  4    Period  Weeks    Status  Achieved      PT SHORT TERM GOAL #4   Title  understand correct bowel health to assist in regulating his bowels    Time  4    Period  Weeks    Status  Achieved        PT Long Term Goals - 02/01/18 1531      PT LONG TERM GOAL #1   Title  independent with HEP and understand how to progress himself safely    Time  8    Period  Weeks    Status  On-going      PT LONG TERM GOAL #2   Title  ability to perform a pelvic floor contraction with a lift at strength level 3/5 to reduce fecal leakage    Time  8    Period  Weeks    Status  On-going      PT LONG TERM GOAL #3   Title  fecal leakage decreased >/= 50% so he has reduced diapers by 3 or less    Baseline  changes diapers 6 plus times per day    Time  8    Period  Weeks    Status  On-going      PT LONG  TERM GOAL #4   Title  improve strength of legs to 4/5 so he is able to walk to the bathroom in time using his rollator walker to reduce fecal leakage    Baseline  knee strength is 3+/5, hip strength 3+to 4-/5    Period  Weeks    Status  On-going      PT LONG TERM GOAL #5   Title  abdominal pain decreased </= 5/10 with daily tasks    Baseline  abdominal pain is 9/10    Time  8    Period  Weeks    Status  On-going            Plan - 02/01/18 1502    Clinical Impression Statement  Patient continues to have fecal leakage.  Patient reports he has been doing his exercises. Patient has been using the squatty potty which has helped with bowel movements.  Patient has not met goals. Patient treatment was interrupted with a phone call for his nurse and going to the bathroom.  Patient has one more visit to review HEP.     Rehab Potential  Good    Clinical Impairments Affecting Rehab Potential  bipolar 1; ADHD, Depression; IBS; 4 abdominal surgeries; Ulcerative Collitis    PT Frequency  1x / week    PT Duration  Other (comment) 11 weeks    PT Treatment/Interventions  Biofeedback;Electrical Stimulation;Cryotherapy;Moist Heat;Ultrasound;Therapeutic activities;Therapeutic exercise;Patient/family education;Neuromuscular re-education;Manual techniques;Scar mobilization;Dry needling    PT Next Visit Plan  gentle soft tissue work to abdomen; medicaid auth ends on 7/1 decide if D/C or renewal    PT Home Exercise Plan  progress as needed    Consulted and Agree with Plan of Care  Patient       Patient  will benefit from skilled therapeutic intervention in order to improve the following deficits and impairments:  Increased fascial restricitons, Pain, Decreased mobility, Decreased scar mobility, Increased muscle spasms, Decreased activity tolerance, Decreased endurance, Decreased strength, Impaired flexibility, Difficulty walking  Visit Diagnosis: Muscle weakness (generalized)  Unspecified lack of  coordination  Cramp and spasm     Problem List Patient Active Problem List   Diagnosis Date Noted  . Hypotension 11/18/2015  . Encephalopathy acute 11/18/2015  . Bipolar disorder with psychotic features ([place]) 03/18/2015  . Feeding difficulties and mismanagement 07/20/2013  . Immunocompromised ([place]) 07/20/2013  . Protein-calorie malnutrition, severe ([place]) 07/09/2013  . EKG, abnormal 05/14/2012  . Bipolar II disorder ([place]) 05/12/2012  . Attention deficit hyperactivity disorder (ADHD) 05/12/2012  . INSOMNIA-SLEEP DISORDER-UNSPEC 05/06/2010  . HYPERCHOLESTEROLEMIA, PURE 06/14/2008  . Anxiety state 11/27/2007  . DEPRESSION 11/27/2007  . Ulcerative colitis ([place]) 04/26/2001    Earlie Counts, PT 02/01/18 3:33 PM   [facility] [address], [place], [place], [address] Phone: [phone]   Fax:  [phone]  Name: [name] MRN: [id number] Date of Birth: [birth year]

## 2018-02-01 NOTE — Patient Instructions (Signed)
Access Code: [email or web address]  URL: [email or web address]  Date: 02/01/2018  Prepared by: Eulis Foster   Exercises  Beginner Bridge - 10 reps - 2 sets - 1x daily - 7x weekly  Supine Transversus Abdominis Bracing - Hands on Ground - 10 reps - 1 sets - 5 hold - 1x daily - 7x weekly  [facility] [address] [place], [place] [address] Phone # [phone] Fax [phone]

## 2018-02-08 ENCOUNTER — Encounter: Payer: Self-pay | Admitting: Physical Therapy

## 2018-02-08 ENCOUNTER — Ambulatory Visit: Payer: Medicaid Other | Admitting: Physical Therapy

## 2018-02-08 DIAGNOSIS — M6281 Muscle weakness (generalized): Secondary | ICD-10-CM

## 2018-02-08 DIAGNOSIS — R279 Unspecified lack of coordination: Secondary | ICD-10-CM

## 2018-02-08 DIAGNOSIS — R252 Cramp and spasm: Secondary | ICD-10-CM

## 2018-02-08 NOTE — Therapy (Signed)
[facility] [address], [place], [place], [address] Phone: [phone]   Fax:  [phone]  Physical Therapy Treatment  Patient Details  Name: [name] MRN: [id number] Date of Birth: 06-28-1981 Referring Provider: Dr. Brayton Layman   Encounter Date: 02/08/2018  PT End of Session - 02/08/18 1530    Visit Number  6    Date for PT Re-Evaluation  02/13/18    Authorization Type  Medicaid 11/22/2017-02/13/2018    Authorization - Visit Number  6    Authorization - Number of Visits  12    PT Start Time  7494    PT Stop Time  1530    PT Time Calculation (min)  45 min    Activity Tolerance  Patient tolerated treatment well    Behavior During Therapy  [facility] for tasks assessed/performed       Past Medical History:  Diagnosis Date  . ADHD (attention deficit hyperactivity disorder)   . Anxiety   . Bipolar 1 disorder ([place])   . Depression   . Excessive weight loss   . IBS (irritable bowel syndrome)   . Insomnia   . Ulcerative colitis     Past Surgical History:  Procedure Laterality Date  . ANAL FISSURECTOMY    . COLON SURGERY    . COLONOSCOPY W/ BIOPSIES     hx ulcerative colitis    There were no vitals filed for this visit.  Subjective Assessment - 02/08/18 1446    Subjective  I have been exercising and my muscles are worked out.  I have had the fecal leakage.  I went to the bathroom 2 times and had to change my pants. The massage of the abdomen has helped with the pain.     Pertinent History  uses a walker due to dizzyness, fall risk    Patient Stated Goals  Patient learn how to have a bowel movement; reduce abdominal pain, get off diahrrehea medicine    Currently in Pain?  Yes    Pain Score  8     Pain Location  Abdomen    Pain Orientation  Anterior    Pain Descriptors / Indicators  Constant    Pain Type  Chronic pain    Pain Onset  More than a month ago    Pain Frequency  Constant    Aggravating  Factors   movement, bowel movement, less sleep    Pain Relieving Factors  meditation, laying down    Multiple Pain Sites  No         [facility] PT Assessment - 02/08/18 0001      Assessment   Medical Diagnosis  K51 Ulcerative pacolitis ; R10.84 Abdominal pain    Referring Provider  Dr. Brayton Layman    Onset Date/Surgical Date  08/17/99    Prior Therapy  none      Precautions   Precautions  Fall    Precaution Comments  get dizzy so he falls      Restrictions   Weight Bearing Restrictions  No      [place] residence      Prior Function   Level of Independence  Independent    Vocation  On disability      Cognition   Overall Cognitive Status  Within Functional Limits for tasks assessed      Observation/Other Assessments   Focus on Therapeutic Outcomes (FOTO)   Patient is limited by 60%  due to fecal leakage,past medical history, and weakness from deconditioning and abdominal pain      Strength   Right Hip Flexion  4/5    Right Hip External Rotation   4/5    Right Hip Internal Rotation  4/5    Right Hip ABduction  3+/5    Right Hip ADduction  3+/5    Left Hip Flexion  4+/5    Left Hip External Rotation  4/5    Left Hip Internal Rotation  4/5    Left Hip ABduction  4-/5    Left Hip ADduction  4-/5    Right Knee Flexion  4/5    Right Knee Extension  4/5    Left Knee Flexion  4/5    Left Knee Extension  4/5                Pelvic Floor Special Questions - 02/08/18 0001    External Perineal Exam  bowel is located on the skin    Skin Integrity  Intact    Pelvic Floor Internal Exam  Patient confirms identification and approves PT to assess anal sphincter strength and muscle integrity and treatment    Exam Type  Rectal    Strength  weak squeeze, no lift    Strength # of seconds  5        [facility] Adult PT Treatment/Exercise - 02/08/18 0001      Neuro Re-ed    Neuro Re-ed Details   anal sphincter contraction in sidely with  therapist finger in the rectum with pull up and contract holding for 5 sec with verbal cues      Manual Therapy   Manual Therapy  Soft tissue mobilization    Soft tissue mobilization  scar massage to right lower quadrant area with myofascial release; abdominal massage to all the abdominal tissue, circular massage to abdomen to promote peristalic movement of the intestines             PT Education - 02/08/18 1530    Education provided  Yes    Education Details  reviewed HEP and patient able to return demonstration correctly    Person(s) Educated  Patient    Methods  Explanation;Demonstration;Verbal cues;Handout    Comprehension  Returned demonstration;Verbalized understanding       PT Short Term Goals - 01/25/18 1457      PT SHORT TERM GOAL #1   Title  independent with initial HEP     Time  4    Period  Weeks    Status  Achieved      PT SHORT TERM GOAL #2   Title  able to understand correct toileting technique to expel his bowels    Time  4    Period  Weeks    Status  Achieved      PT SHORT TERM GOAL #3   Title  able to perform a pelvic floor contraction to begin strengthening of the pelvic floor    Time  4    Period  Weeks    Status  Achieved      PT SHORT TERM GOAL #4   Title  understand correct bowel health to assist in regulating his bowels    Time  4    Period  Weeks    Status  Achieved        PT Long Term Goals - 02/08/18 1531      PT LONG TERM GOAL #1   Title  independent with  HEP and understand how to progress himself safely    Time  8    Period  Weeks    Status  Achieved      PT LONG TERM GOAL #2   Title  ability to perform a pelvic floor contraction with a lift at strength level 3/5 to reduce fecal leakage    Baseline  most contractions are 2/5 and some are 3/5    Time  8    Period  Weeks    Status  Not Met      PT LONG TERM GOAL #3   Title  fecal leakage decreased >/= 50% so he has reduced diapers by 3 or less    Baseline  changes diapers  6 plus times per day    Time  8    Period  Weeks    Status  Not Met      PT LONG TERM GOAL #4   Title  improve strength of legs to 4/5 so he is able to walk to the bathroom in time using his rollator walker to reduce fecal leakage    Baseline  knee strength is 4/5, hip strength 3+to 4-/5    Time  8    Period  Weeks    Status  Not Met      PT LONG TERM GOAL #5   Title  abdominal pain decreased </= 5/10 with daily tasks    Baseline  abdominal pain is 8/10    Time  8    Period  Weeks    Status  Not Met              Patient will benefit from skilled therapeutic intervention in order to improve the following deficits and impairments:     Visit Diagnosis: Muscle weakness (generalized)  Unspecified lack of coordination  Cramp and spasm     Problem List Patient Active Problem List   Diagnosis Date Noted  . Hypotension 11/18/2015  . Encephalopathy acute 11/18/2015  . Bipolar disorder with psychotic features ([place]) 03/18/2015  . Feeding difficulties and mismanagement 07/20/2013  . Immunocompromised ([place]) 07/20/2013  . Protein-calorie malnutrition, severe ([place]) 07/09/2013  . EKG, abnormal 05/14/2012  . Bipolar II disorder ([place]) 05/12/2012  . Attention deficit hyperactivity disorder (ADHD) 05/12/2012  . INSOMNIA-SLEEP DISORDER-UNSPEC 05/06/2010  . HYPERCHOLESTEROLEMIA, PURE 06/14/2008  . Anxiety state 11/27/2007  . DEPRESSION 11/27/2007  . Ulcerative colitis ([place]) 04/26/2001    Earlie Counts, PT 02/08/18 3:35 PM   [facility] [address] [place], [place], [address] Phone: [phone]   Fax:  [phone]  Name: Amro Winebarger MRN: [id number] Date of Birth: 1981/02/25  PHYSICAL THERAPY DISCHARGE SUMMARY  Visits from Start of Care: 6  Current functional level related to goals / functional outcomes: See above   Remaining deficits: See above. Continues to have weakness in the legs.  Patient continues to have fecal leakage and has to change his pants.  Patient is not consistent with HEP due to being tired at times.     Education / Equipment: HEP Plan: Patient agrees to discharge.  Patient goals were partially met. Patient is being discharged due to lack of progress.  Thank you for the referral. Earlie Counts, PT 02/08/18 3:34 PM  ?????

## 2018-05-18 ENCOUNTER — Other Ambulatory Visit: Payer: Self-pay

## 2018-05-18 ENCOUNTER — Telehealth: Payer: Self-pay | Admitting: Psychiatry

## 2018-05-18 NOTE — Telephone Encounter (Signed)
Pt requests we call in Xanax. Use pharmacy in chart

## 2018-05-19 ENCOUNTER — Other Ambulatory Visit: Payer: Self-pay

## 2018-05-19 MED ORDER — CLONAZEPAM 1 MG PO TABS: 1.0000 mg | ORAL_TABLET | Freq: Three times a day (TID) | ORAL | 1 refills | Status: DC

## 2018-05-19 NOTE — Telephone Encounter (Signed)
LOOKS LIKE HE CALLED YESTERDAY, BUT HE CALLED THIS MORNING REQUESTING REFILL ON HIS CLONAZEPAM.  [facility], APPT 11/4

## 2018-05-19 NOTE — Telephone Encounter (Signed)
rx called in today, pharmacy closed last night.

## 2018-06-08 ENCOUNTER — Ambulatory Visit: Payer: BLUE CROSS/BLUE SHIELD | Admitting: Psychiatry

## 2018-06-08 ENCOUNTER — Encounter: Payer: Self-pay | Admitting: Psychiatry

## 2018-06-08 DIAGNOSIS — F4310 Post-traumatic stress disorder, unspecified: Secondary | ICD-10-CM

## 2018-06-08 DIAGNOSIS — F411 Generalized anxiety disorder: Secondary | ICD-10-CM

## 2018-06-08 DIAGNOSIS — F3160 Bipolar disorder, current episode mixed, unspecified: Secondary | ICD-10-CM

## 2018-06-08 DIAGNOSIS — F902 Attention-deficit hyperactivity disorder, combined type: Secondary | ICD-10-CM

## 2018-06-08 NOTE — Assessment & Plan Note (Addendum)
History of agitated states and episodic depression, 1 known episode that may fully qualify as manic, with suspicion of physiologically based delirium from severe inflammatory bowel disease.  Chronically disturbed circadian rhythm, sleep cycle affected by flareups of pain and IBD, even after colectomy  On mood stabilizers and psychotherapy Q 2-4 weeks depending availability, transportation, and subsidized costs.  Looking into CST a/o 06/08/18

## 2018-06-08 NOTE — Assessment & Plan Note (Signed)
On Adderall, hx with previous psychiatrist of high-dose stimulant alongside high-dose sedative.  R/o autistic spectrum or other developmental disorder affecting both social perception and concentration

## 2018-06-08 NOTE — Progress Notes (Signed)
[facility] Counselor/Therapist Progress Note   Patient ID: [name], MRN: [id number]  Date: 06/08/2018  Start time: 1:20p Stop time: 2:10p  Time Spent: 50 min  Treatment Type: Individual  Subjective:   Now off the walker.  PT has been helpful.  Saw his appointment on Epic, denies getting a reminder.  Says PCP in [place] has recommended he get a CST or ACT but doesn't know much.  Thinks he needs an ACT, is thinking of approaching [facility], where he has obtained services, or a new agency called [facility].  Notes anxiety about health, finances, family noncontact, housing, and now a neighbor alleged to be alcoholic and racist (the two of them are minority white in the complex -- seems to be an implied alliance by the older man), cranky, conservative, and a veteran.  Notes chronic pain (from nerve damage associated with colectomy surgery) and meditation helps.  Been reading on child development.   Feels the distress around him in a low-income community.  Alarmed one day hearing someone try the door to his apartment.  Interventions:CBT, Solution Focused and Psychosocial Skills: working with public resources Researched and discussed team treatment models, sounds like CST more appropriate.  Recommend approach [facility], since he knows them already.  Offered to help investigate if he will re-establish ROI but declined, alluding to problems with opening that channel (reference to illusions established when paranoid before?).  Listened to the rest.  No further conflict with TX, rapport seems to be improving again, though hard to tell, as resentments and suspicions have come out by surprise in the past year.  Mental Status Exam:   Appearance:   Casual and Well Groomed     Behavior:  Appropriate, Monopolizing and somewhat  Motor:  Normal and off walker  Speech/Language:   Pressured and somewhat  Affect:  Constricted and but more open  Mood:  anxious  Thought process:   Tangential and Disorganized  Thought content:    appropriate  Perceptual disturbances:    Normal  Orientation:  Full (Time, Place, and Person)  Attention:  Fair  Concentration:  difficulty with focus and attention  Memory:  WNL  Fund of knowledge:   Good  Insight:    Fair  Judgment:   Fair  Impulse Control:  fair    Reported Symptoms: anxiety, worry, scattered attention, sleep disturbance,   Risk Assessment: Danger to Self:  No Self-injurious Behavior: No Danger to Others: No Duty to Warn:no Physical Aggression / Violence:No  Access to Firearms a concern: No  Gang Involvement:No   Diagnosis:   ICD-10-CM   1. 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 Attention deficit hyperactivity disorder (ADHD), combined type F90.2   3. Generalized anxiety disorder F41.1   4. PTSD (post-traumatic stress disorder) F43.10    Plan:  . Investigate ACT, CST for himself with the two options mentioned, RSVP when has chosen one and ask contact person about standards for release of information between parties involved.  Still recommend open ROI with PCP and with [facility] case Production designer, theatre/television/film. . Continue to utilize previously learned skills ad lib . Maintain medication, if prescribed, and work faithfully with relevant prescriber(s) . Call the clinic on-call service, present to ER, or call 911 if any life-threatening emergency . Follow up with me in about  . Remains disabled from any occupation due to symptom severity.  Robley Fries, PhD

## 2018-06-08 NOTE — Patient Instructions (Addendum)
Re. CST:  It is OK to talk to [facility] about what their CST is like.  I predict that [facility]'s CST will become the better choice, just because they already have it built, you know each other already, and Tammy Sours can inform them very well on your behalf.  Once you know what CST program you want, please put me in touch with whoever I should talk with.  Currently, we have no active releases to talk with anyone about your care, and the team approach really needs that opened.  Ways to bring down anxiety, in addition to the medicine you have:  Slower, fuller breathing  Walk it off, just walking and noticing things instead of paying attention to worries  Drink of water, especially if it's cold water  Hold ice cubes until they melt  Journal --- write out what you're thinking about and how you feel  Have an imaginary conversation with your therapist, or anybody you have ever trusted

## 2018-06-19 ENCOUNTER — Ambulatory Visit (INDEPENDENT_AMBULATORY_CARE_PROVIDER_SITE_OTHER): Payer: Self-pay | Admitting: Psychiatry

## 2018-06-19 ENCOUNTER — Encounter: Payer: Self-pay | Admitting: Psychiatry

## 2018-06-19 DIAGNOSIS — F840 Autistic disorder: Secondary | ICD-10-CM

## 2018-06-19 DIAGNOSIS — F411 Generalized anxiety disorder: Secondary | ICD-10-CM

## 2018-06-19 DIAGNOSIS — F900 Attention-deficit hyperactivity disorder, predominantly inattentive type: Secondary | ICD-10-CM

## 2018-06-19 DIAGNOSIS — F319 Bipolar disorder, unspecified: Secondary | ICD-10-CM

## 2018-06-19 DIAGNOSIS — F4010 Social phobia, unspecified: Secondary | ICD-10-CM

## 2018-06-19 MED ORDER — AMPHETAMINE-DEXTROAMPHETAMINE 15 MG PO TABS: 15.0000 mg | ORAL_TABLET | Freq: Three times a day (TID) | ORAL | 0 refills | Status: AC

## 2018-06-19 NOTE — Progress Notes (Signed)
[name] National [id number] 1981-01-20 37 y.o.  Subjective:   Patient ID:  [name] is a 37 y.o. (DOB 10-Aug-1981) male.  Chief Complaint:  Chief Complaint  Patient presents with  . Anxiety  . Sleeping Problem  . Stress    chronic health  . ADD    HPI [name] presents to the office today for follow-up of multiple psychiatric problems.  Complains of worsening stress DT poverty having adverse effect on his anxiety.  Would like an increase in the clonazepam.  Depressed mood situational.  Sleep disturbed by colitis sx, diarrhea. He feels he needs all the medications that he takes.  Chronically stressed.  Chronic dysfunction and isolation.  He asked about diagnoses.     Review of Systems:  Review of Systems  Gastrointestinal: Positive for abdominal distention, abdominal pain and diarrhea.  Neurological: Positive for weakness. Negative for tremors.  Psychiatric/Behavioral: Positive for decreased concentration, dysphoric mood and sleep disturbance. Negative for agitation, behavioral problems, confusion, hallucinations, self-injury and suicidal ideas. The patient is nervous/anxious. The patient is not hyperactive.     Medications: I have reviewed the patient's current medications.  Current Outpatient Medications  Medication Sig Dispense Refill  . amphetamine-dextroamphetamine (ADDERALL) 15 MG tablet Take 1 tablet by mouth 3 (three) times daily. 90 tablet 0  . clonazePAM (KLONOPIN) 1 MG tablet Take 1 tablet (1 mg total) by mouth 3 (three) times daily. 90 tablet 1  . gabapentin (NEURONTIN) 600 MG tablet Take 600 mg by mouth 3 (three) times daily.    . hydrOXYzine (ATARAX/VISTARIL) 50 MG tablet TAKE ONE TABLET BY MOUTH FOUR TIMES DAILY    . lamoTRIgine (LAMICTAL) 150 MG tablet Take 300 mg by mouth daily.     Marland Kitchen loperamide (IMODIUM) 2 MG capsule TAKE 2 (TWO) CAPSULES BY MOUTH FIVE TIMES A DAY  5  . Multiple Vitamin (MULTIVITAMIN WITH MINERALS) TABS  Take 1 tablet by mouth daily. As a nutritional supplement. 30 tablet 0  . pregabalin (LYRICA) 100 MG capsule Take 100 mg by mouth 3 (three) times daily.     . Probiotic Product (VSL#3 DS) PACK 2 packets by mouth twice a day    . promethazine (PHENERGAN) 25 MG tablet TAKE 1 TABLET EVERY 6 HOURS AS NEEDED FOR NAUSEA & VOMITING. 30 tablet 1  . traZODone (DESYREL) 150 MG tablet Use 1 or 2 tabs at nightly as needed for insomnia.    . Vitamins/Minerals TABS Take by mouth.    . ziprasidone (GEODON) 40 MG capsule Take 40 mg by mouth 2 (two) times daily with a meal. 1 in AM and 2 in PM    . zolpidem (AMBIEN) 10 MG tablet Take 10 mg by mouth at bedtime.     [name] ON 07/17/2018] amphetamine-dextroamphetamine (ADDERALL) 15 MG tablet Take 1 tablet by mouth 3 (three) times daily. 90 tablet 0  . [START ON 08/14/2018] amphetamine-dextroamphetamine (ADDERALL) 15 MG tablet Take 1 tablet by mouth 3 (three) times daily. 90 tablet 0  . azaTHIOprine (IMURAN) 50 MG tablet TAKE (3) TABLETS DAILY. (Patient not taking: Reported on 10/10/2017) 90 tablet 11  . EPINEPHrine (EPIPEN) 0.3 mg/0.3 mL SOAJ injection Inject 0.3 mLs (0.3 mg total) into the muscle as needed. (Patient not taking: Reported on 06/19/2018) 1 Device 1  . EPINEPHrine 0.3 mg/0.3 mL IJ SOAJ injection Inject into the muscle.    . folic acid (FOLVITE) 1 MG tablet TAKE 1 TABLET EACH DAY. (Patient not taking: Reported on 10/10/2017) 30 tablet 11  .  glycopyrrolate (ROBINUL) 2 MG tablet Take 1 tablet (2 mg total) by mouth 2 (two) times daily. (Patient not taking: Reported on 10/10/2017) 60 tablet 5  . hyoscyamine (LEVBID) 0.375 MG 12 hr tablet Take 0.375 mg by mouth 2 (two) times daily.     Marland Kitchen ibuprofen (ADVIL,MOTRIN) 200 MG tablet TAKE ONE TABLET BY MOUTH EVERY 4 HOURS    . metroNIDAZOLE (FLAGYL) 500 MG tablet TAKE ONE-HALF TABLET ( 250 MG TOTAL) BY MOUTH THREE TIMES PER DAY FOR 7 DAYS  0  . oxyCODONE (OXY IR/ROXICODONE) 5 MG immediate release tablet Take 5 mg by  mouth every 6 (six) hours as needed for severe pain.    . predniSONE (DELTASONE) 20 MG tablet Take 40 mg for two weeks and then decrease to 30 mg once daily until follow up visit (Patient not taking: Reported on 10/10/2017) 60 tablet 0  . Probiotic Product (VSL#3 DS) PACK Take 1 each by mouth 4 (four) times daily. 6 each 0   No current facility-administered medications for this visit.     Medication Side Effects: None  Allergies:  Allergies  Allergen Reactions  . Cephalexin     REACTION: urticaria (hives)    Past Medical History:  Diagnosis Date  . ADHD (attention deficit hyperactivity disorder)   . Anxiety   . Bipolar 1 disorder (HCC)   . Depression   . Encephalopathy acute 11/18/2015  . Excessive weight loss   . IBS (irritable bowel syndrome)   . Insomnia   . Ulcerative colitis     Family History  Problem Relation Age of Onset  . Lymphoma Paternal Grandmother   . Leukemia Maternal Grandmother   . Colon cancer Neg Hx     Social History   Socioeconomic History  . Marital status: Single    Spouse name: Not on file  . Number of children: 0  . Years of education: Not on file  . Highest education level: Not on file  Occupational History  . Occupation: Loss adjuster, chartered: [employer]  Social Needs  . Financial resource strain: Not on file  . Food insecurity:    Worry: Not on file    Inability: Not on file  . Transportation needs:    Medical: Not on file    Non-medical: Not on file  Tobacco Use  . Smoking status: Never Smoker  . Smokeless tobacco: Never Used  Substance and Sexual Activity  . Alcohol use: No  . Drug use: No  . Sexual activity: Never  Lifestyle  . Physical activity:    Days per week: Not on file    Minutes per session: Not on file  . Stress: Not on file  Relationships  . Social connections:    Talks on phone: Not on file    Gets together: Not on file    Attends religious service: Not on file    Active member of club or  organization: Not on file    Attends meetings of clubs or organizations: Not on file    Relationship status: Not on file  . Intimate partner violence:    Fear of current or ex partner: Not on file    Emotionally abused: Not on file    Physically abused: Not on file    Forced sexual activity: Not on file  Other Topics Concern  . Not on file  Social History Narrative  . Not on file    Past Medical History, Surgical history, Social history, and Family  history were reviewed and updated as appropriate.   Please see review of systems for further details on the patient's review from today.   Objective:   Physical Exam:  There were no vitals taken for this visit.  Physical Exam  Neurological: He displays no tremor. Gait abnormal.  Psychiatric: His mood appears anxious. His affect is angry and blunt. His speech is not rapid and/or pressured, not tangential and not slurred. He is slowed and withdrawn. He is not agitated, not aggressive, not hyperactive and not actively hallucinating. Thought content is not paranoid and not delusional. Cognition and memory are normal. He exhibits a depressed mood. He expresses no homicidal and no suicidal ideation.  Insight is mod poor as is judgment chronically. Chronically wants an increase in BZ. Walking without walker today. He is inattentive.    Lab Review:     Component Value Date/Time   NA 139 11/19/2015 0247   K 3.8 11/19/2015 0247   CL 110 11/19/2015 0247   CO2 23 11/19/2015 0247   GLUCOSE 134 (H) 11/19/2015 0247   BUN <5 (L) 11/19/2015 0247   CREATININE 0.78 11/19/2015 0247   CALCIUM 8.1 (L) 11/19/2015 0247   PROT 6.6 11/18/2015 1307   ALBUMIN 3.4 (L) 11/18/2015 1307   AST 14 (L) 11/18/2015 1307   ALT 11 (L) 11/18/2015 1307   ALKPHOS 55 11/18/2015 1307   BILITOT 0.9 11/18/2015 1307   GFRNONAA >60 11/19/2015 0247   GFRAA >60 11/19/2015 0247       Component Value Date/Time   WBC 5.1 11/19/2015 0247   RBC 3.39 (L) 11/19/2015 0247    HGB 9.8 (L) 11/19/2015 0247   HCT 30.1 (L) 11/19/2015 0247   PLT 306 11/19/2015 0247   MCV 88.8 11/19/2015 0247   MCH 28.9 11/19/2015 0247   MCHC 32.6 11/19/2015 0247   RDW 14.2 11/19/2015 0247   LYMPHSABS 1.4 11/18/2015 1307   MONOABS 0.5 11/18/2015 1307   EOSABS 0.1 11/18/2015 1307   BASOSABS 0.0 11/18/2015 1307    No results found for: POCLITH, LITHIUM   No results found for: PHENYTOIN, PHENOBARB, VALPROATE, CBMZ   .res Assessment: Plan:    Bipolar I disorder (HCC)  Attention deficit hyperactivity disorder (ADHD), predominantly inattentive type - Plan: amphetamine-dextroamphetamine (ADDERALL) 15 MG tablet, amphetamine-dextroamphetamine (ADDERALL) 15 MG tablet, amphetamine-dextroamphetamine (ADDERALL) 15 MG tablet  Generalized anxiety disorder  Social anxiety disorder  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 We discussed his chronic anxiety over his situation with poverty and his health and some difficulty in dealing with people.  Answered his questions about diagnosis.  He does not believe that he has schizophrenia and I am not currently diagnosing him as having schizophrenia.  A lot of his and his anxiety is also driven by his Asperger's syndrome which causes him to misunderstand and misinterpret the actions of others.  Discussed potential metabolic side effects associated with atypical antipsychotics, as well as potential risk for movement side effects. Advised pt to contact office if movement side effects occur.  He seems to be tolerating the medications well by his report.  Again we discussed the less than desirable situation of using stimulants plus sedatives together.  He feels that it is necessary because he cannot manage without the stimulants nor can he manage without the  benzodiazepines.  We also discussed the cognitive risks associated  not only with benzodiazepines but also hydroxyzine.  He feels that it is medically necessary also.  We discussed the risks of polypharmacy which can result in the greater risk of side effects and reduction benefit.  He repetitively calls between appointments asking for increases in the clonazepam which are consistently denied.  This was again discussed today.  We will also not increase the Adderall further for similar reasons outlined above.  This appointment was 30 minutes  Follow-up 3 months  Meredith Staggers MD, DFAPA  Please see After Visit Summary for patient specific instructions.  Future Appointments  Date Time Provider Department Center  06/20/2018  4:00 PM Robley Fries, PhD [facility] None  07/03/2018  4:00 PM Robley Fries, PhD [facility] None  09/19/2018  3:00 PM Cottle, Steva Ready., MD [facility] None    No orders of the defined types were placed in this encounter.     -------------------------------

## 2018-06-20 ENCOUNTER — Ambulatory Visit (INDEPENDENT_AMBULATORY_CARE_PROVIDER_SITE_OTHER): Payer: Self-pay | Admitting: Psychiatry

## 2018-06-20 DIAGNOSIS — F4310 Post-traumatic stress disorder, unspecified: Secondary | ICD-10-CM

## 2018-06-20 DIAGNOSIS — F319 Bipolar disorder, unspecified: Secondary | ICD-10-CM

## 2018-06-20 DIAGNOSIS — F902 Attention-deficit hyperactivity disorder, combined type: Secondary | ICD-10-CM

## 2018-06-20 NOTE — Progress Notes (Addendum)
[facility] Counselor/Therapist Progress Note   Patient ID: [name], MRN: [id number]  Date: 06/20/2018  Start time: 4:25p     Stop time: 5:10p Time Spent: 45 min  Treatment Type: Individual  Narrative: [name] faced on entry, ambulatory without walker, soft-spoken and initially deferential about agenda for the session.  Reviewing plans for an interdisciplinary team, he says PCP wants PT to have an ACT team rather than CST.  Reports [facility] will not offer either service, but [facility] told him there are 4 approves CST/ACT agencies, anticipates meeting with [facility] in company of his peers support specialist.  In short order, PT began to complain about staff not accepting payment for services.  Very difficult to decipher PTs perspective, eventually gleaned that he is very afraid that government, Medicaid, or [name] will charge him with misappropriation of funds if he does not spend those funds here, and in his view, our failure to accept money is directly putting him in violation of the rules upon him by third-party payment.  Interrupted session to do fact-finding with the front office but was unable to reach the office manager.  Best understanding that PT does not owe any money to this practice, and therefore it would be wrong to accept overpayment from him.  Attempted to explain to PT, but he kept interrupting and repeating himself about telling [name] about misappropriation of funds, and utterance that sounded for a time as if he was threatening to charge this practice with misappropriation of public funds.  This point was later cleared up as PT feeling in danger of being accused himself.  To complicate matters, PT abruptly changed topics between this matter, his diagnosis, and TX's recollection of a moment in a prior session.  Regarding diagnosis, PT asked the working diagnosis here, was told that working diagnosis is bipolar disorder, to which he abruptly said [facility] does  not have him diagnosed with bipolar disorder.  Unclear what point he was trying to make, as he kept redirecting the conversation back and forth between issues without acknowledging any answer to any question asked.  Eventually, he mentioned that [name] has been pressing him about what he is being treated for here, to which I replied bipolar disorder, PTSD, and ADHD.  Also offered that if she wants a direct accounting of what he is being treated for here, he should put her in direct touch with [place], which he declined vigorously, alleging other issues in care here.  (In the history of attempted coordination of PTs care, PT has previously mentioned his caseworker inquiring about diagnosis and treatment, but only brought it up at times when he was forbidding release of information.)  An issue in care here brought up by PT is that he has allegedly never been given or allowed a printout of his appointment record here, which, by office policy, cannot possibly be true as stated.  (PT has been denied copy of his progress notes based on clinical understanding that his volatility, overwhelming tendency to obsess to his detriment, and propensity to descend into paranoid states if thus armed with language and responsibilities he does not comprehend.  Recent indications are that he may have been obsessing again and resenting again this denial, though he does not mention it today.  History over the past year or more has also been that PT is silent for long periods of time about these wishes, presents himself in an accommodating manner, and then unexpectedly comes in in  an easily excited state, making accusations, asking prosecutorial questions, and interrupting attempts to answer.)  In point of fact, PT certainly may have a printout of his appointment record, without any reservations, if he makes that wish clear.  The only obstacle being technological at this point, as we are in the throes of a changeover from one computer  system to another, and staff are hard-pressed to deliver such requests at the moment of checkout.  Attempted to explain this without success, as PT continued to switch subjects and interrupt answers.  PT pressed a rather prosecutorial conversation as to whether TX had ever asked a question of his peer support specialist, [name], during a session where PT allowed PSS to join.  Specifically, PT asked, in an accusing manner, whether TX had ever asked [name] if this practice could refer a patient for peer support services at his agency ([facility]).  As this is a difficult detail to recall something like a year after the fact, I began to answer to the best of my recollection but was again cut off by PT, who accused me of contradicting myself and began insisting that I put "it" in writing without making it clear what I was to put in writing.  Eventually clarified that he wanted me to put in writing that I had not asked such a question (an attempt to establish perjury, it seems), when in fact I said I am sure that I did at some point ask whether referrals were OK.  Seeing that PT was insistent on this point of putting it in writing, I did commence to put in writing, handwritten on notebook paper with signature and date, that I did, at some time, during a session that included Mr. [name], ask whether referrals were welcome at his agency.  Also noted specifically asking whether another patient I work with, who herself works in peer support, and who may be interested in changing agencies, could speak specifically with Mr. [name] about what it is like to work at his agency.  At this point, PT openly declared that he thought this was an improper use of his therapy time, indicating further to this writer that he was primarily interested in hostilities today.  In point of fact, the question asked of PT's guest in therapy was asked within a timeframe that did not infringe on services to PT in any way, and TX spent significantly  more than the allotted and billed time in service that day.  Returning quickly to the issue of refusing payment (I.e., putting him in [place] with Medicaid), PT insisted that TX accept a $180 money order from his hand in payment of services, all the while declaring such things as he knows I know more about this situation than I am claiming to know, and that I have to be in on how the money works.  (In point of fact, I am very ignorant of day-to-day billing and payment issues, let alone interactions a patientmay have at the window before I come out to receive him/her.)  These charges, too, seemed to indicate that PT was primarily interested in hostilities, not open to explanations, collaborative understandings, or the usual purposes of therapy.  PT elected to leave early and walked out straight away, despite being offered specifically to walk up with him and ensure a printout of service dates from the front desk.  After PT's exit, I consulted second in command among the front office staff to learn about the confusing payment and billing  situation.  Informed by staff member that PT has already overpaid his account, and so further payment cannot and should not be accepted, as it would be unfair to him to do so.  Staff member says she has tried repeatedly to explain to him that he is overpaid and further overpayment cannot be accepted out of turn, and she has made it clear that he is overpaid because he has not used his allotment of 2 sessions per month while he continues to pay funds provided to him to reimburse services at that rate.  Understood more clearly at this point that PT feels in danger of being accused or prosecuted for misappropriation because he has already received public monies for reimbursement/subsidy and, in affect, either owes the government to refund because of it or owes it to the payor to let them know he has underutilized compared to subsidy provided.  Based on his account, it is clear that PT  has been operating on rigid, false beliefs and failure to understand his options in relating both to payor and to provider, simultaneously underscoring his social skills deficits and his stress-related anger management problem.  I was informed that money orders require special handling, and agreed to refer the matter of the insisted payment to the office manager.  Interventions: Customer service and crisis management  Mental Status Exam:    Appearance:   Casual and Well Groomed     Behavior:  Blaming, Suspicious, Monopolizing, Agitated and nonviolent  Motor:  Restlestness, forward posture  Speech/Language:   Pressured  Affect:  moderately hostile  Mood:  irritable  Thought process:  conclusion-jumping, rapid responses, suspicious  Thought content:    Paranoid Ideation  Sensory/Perceptual disturbances:    not assessed, none suspected  Orientation:  Not assessed, none suspected  Attention:  Fair  Concentration:  Fair and driven by emotional state  Memory:  Not assessed; indications of distortion and driven by emotional state  Fund of knowledge:   Not assessed  Insight:    Poor  Judgment:   Poor  Impulse Control:  Fair     Risk Assessment: Danger to Self:  unable to assess, none suspected Self-injurious Behavior: No Danger to Others: unable to assess, none suspected Duty to Warn:no Physical Aggression / Violence:No  Access to Firearms a concern: No  Gang Involvement:No   Diagnosis:   ICD-10-CM   1. Bipolar I disorder (HCC) F31.9   2. PTSD (post-traumatic stress disorder) F43.10   3. Attention deficit hyperactivity disorder (ADHD), combined type F90.2      Plan:  . Confer with office manager when possible to process unnecessary money order payment.  Most likely will result in a refund with letter of explanation. . Continue to utilize previously learned skills ad lib . Maintain medication, if prescribed, and work faithfully with relevant prescriber(s) . Call the clinic  on-call service, present to ER, or call 911 if any life-threatening emergency . Follow up with me at discretion, if desired.  PT has sole responsibility for managing schedule and soliciting services.   . Current status of ROI is negative for all providers and other entities, at last understanding of PT,s wishes. . Apprise Dr. Jennelle Human, as treating physician and practice manager, of the above   Robley Fries, PhD   Addendum 06/30/18: Office manager reports she sent PT's money order back to him in the mail with a letter stating he is overpaid and we cannot accept.  Did not go into explanation that if he feels he  is at risk for judgment by payor, he should get in touch with them to explain underutilization and adjust subsidy to match utilization.  Also did not provide printout of appointments as requested by this Clinical research associate.  Will try to provide that at next visit in order to eliminate one source of misunderstanding and acrimony.  - R.A.M.

## 2018-07-03 ENCOUNTER — Ambulatory Visit: Payer: Self-pay | Admitting: Psychiatry

## 2018-07-03 DIAGNOSIS — F3160 Bipolar disorder, current episode mixed, unspecified: Secondary | ICD-10-CM

## 2018-07-03 DIAGNOSIS — G4700 Insomnia, unspecified: Secondary | ICD-10-CM

## 2018-07-03 DIAGNOSIS — F411 Generalized anxiety disorder: Secondary | ICD-10-CM

## 2018-07-03 DIAGNOSIS — F5105 Insomnia due to other mental disorder: Secondary | ICD-10-CM

## 2018-07-03 DIAGNOSIS — F900 Attention-deficit hyperactivity disorder, predominantly inattentive type: Secondary | ICD-10-CM
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
## 2018-07-06 ENCOUNTER — Other Ambulatory Visit: Payer: Self-pay

## 2018-07-07 ENCOUNTER — Other Ambulatory Visit: Payer: Self-pay

## 2018-07-07 MED ORDER — CLONAZEPAM 1 MG PO TABS: 1.0000 mg | ORAL_TABLET | Freq: Three times a day (TID) | ORAL | 1 refills | Status: DC

## 2018-07-11 ENCOUNTER — Other Ambulatory Visit: Payer: Self-pay

## 2018-07-11 ENCOUNTER — Telehealth: Payer: Self-pay | Admitting: Psychiatry

## 2018-07-11 MED ORDER — CLONAZEPAM 1 MG PO TABS: 1.0000 mg | ORAL_TABLET | Freq: Three times a day (TID) | ORAL | 1 refills | Status: DC

## 2018-07-11 NOTE — Telephone Encounter (Signed)
Scott called to request refill on his clonazepam.  He wants it called in before the holiday so he will be able to get it when due.  [facility].

## 2018-07-11 NOTE — Telephone Encounter (Signed)
Pt called requesting refill on klonopin to go to [facility], he will be due on 07/17/2018. Called in #90 with 1 refill. Fill on 07/17/2018.   Pt doesn't use [facility]

## 2018-07-11 NOTE — Telephone Encounter (Signed)
rx called into [facility], ok to fill 07/17/2018

## 2018-07-28 ENCOUNTER — Ambulatory Visit: Payer: Medicaid Other | Admitting: Psychiatry

## 2018-08-14 ENCOUNTER — Ambulatory Visit (INDEPENDENT_AMBULATORY_CARE_PROVIDER_SITE_OTHER): Payer: Self-pay | Admitting: Psychiatry

## 2018-08-14 ENCOUNTER — Other Ambulatory Visit: Payer: Self-pay | Admitting: Psychiatry

## 2018-08-14 DIAGNOSIS — G4701 Insomnia due to medical condition: Secondary | ICD-10-CM

## 2018-08-14 DIAGNOSIS — F900 Attention-deficit hyperactivity disorder, predominantly inattentive type: Secondary | ICD-10-CM

## 2018-08-14 DIAGNOSIS — K047 Periapical abscess without sinus: Secondary | ICD-10-CM

## 2018-08-14 DIAGNOSIS — F3160 Bipolar disorder, current episode mixed, unspecified: Secondary | ICD-10-CM

## 2018-08-14 DIAGNOSIS — F840 Autistic disorder: Secondary | ICD-10-CM

## 2018-08-14 MED ORDER — CLONAZEPAM 1 MG PO TABS: 1.0000 mg | ORAL_TABLET | Freq: Three times a day (TID) | ORAL | 1 refills | Status: AC

## 2018-08-14 NOTE — Progress Notes (Signed)
Psychotherapy Progress Note -- Benjamin CzarAndy Natilee Gauer, PhD, [facility]  Patient ID: Benjamin SlimRichard Scott [name]     MRN: [id number]     Date: 08/14/2018     Therapy format: Individual psychotherapy Start: 3:20p  Stop: 4:10p Time Spent: 50 min Accompanied by: none  Session narrative -- interim history, self-report of stressors and symptoms, applications of prior therapy, status changes, and interventions in session E-chart review shows PCP not refilling Ambien at this time as he says he is not taking it.  May address gabapentin/Lyrica next visit.  Notes says he is set up with an agency called [facility], agree on need to reduce Lamictal.  Re. sleep, is working on Event organisertaming electronics exposure.    Well groomed, neatly dressed today.  Says Dr. Cammie [name] was to call and line up CST.  Will be meeting with her on Thursday to find out more.  Found out [employer] will pay for his eye exam.  Amber lenses really help for sleep readiness.  Noticing starkly how differently he feels and lives with controlled exposure to electronics. A couple days of no electronics available was really eye-opening how he turned his attention outward.  Sleep still varied, attributed to bowel problems, using health-monitoring watch to track.  Relatively little deep sleep.  Per his watch, 30-day average 40% awake, 15% REM (low end), 59% light sleep, 10% deep sleep (c. 1/2 of standard).  No spontaneous mention of medication changes.    Getting walks sometimes, which feel better.  Doing yoga and meditation, has taken on low-cost life coaching ($10/hr).  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  Advised here to check and see if the [facility] will allow the results and clear him for licensure if he does.  Wants to get testing accommodations if he does.  (Dehydrated and distracted by frequent bathroom trips when he took it before, in addition to any ADHD sxs that may have been active.)  Has dental infection, will need another  extraction and a biopsy.  Painful now.  Has a loose upper front tooth, liable to come out on its own.    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  Car issue is now settled with insurance company.  Wants to get new transportation soon.  Probed wishes for therapy at this point.  Wants to find resources, basically, e.g. for food (called [employer]) and car ([facility] but felt they were brusque with him -- "but it could have been me").  Possibly to help write for accommodations if he retakes the bar exam.  Therapeutic modalities: Cognitive Behavioral Therapy, Solution-Oriented/Positive Psychology and resource work  Mental Status/Observations:  Appearance:   Casual, Neat and Well Groomed     Behavior:  Appropriate  Motor:  Normal and slightly stooped gait (pain)  Speech/Language:   more normal articulation, not pressured  Affect:  Appropriate and Restricted  Mood:  anxious and concerned  Thought process:  flight of ideas and nonmanic, baseline scattered attention  Thought content:    WNL  Sensory/Perceptual disturbances:    WNL  Orientation:  intact  Attention:  Fair  Concentration:  Fair  Memory:  WNL may be subject to distortion  Insight:    Fair  Judgment:   Fair  Impulse Control:  Fair   Risk Assessment: Danger to Self:  No Self-injurious Behavior: No Danger to Others: No Duty to Warn:no Physical Aggression / Violence:No  Access to Firearms a concern: No  Diagnosis:   ICD-10-CM   1. 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 Insomnia due to medical condition G47.01   3. Autism spectrum disorder with accompanying intellectual disability without language impairment, requiring substantial support F84.0   4. Attention deficit hyperactivity disorder (ADHD), predominantly inattentive type F90.0   5. Dental infection K04.7    with anticipated tooth loss    Assessment of progress:  improving  Plan:  . RSVP after learning more about how CST and  case coordination would work . Consider further what goals for psychotherapy . Next med check with Dr. Jennelle Human 09/19/18 . Continue to utilize previously learned skills ad lib . Maintain medication, if prescribed, and work faithfully with relevant prescriber(s) . Call the clinic on-call service, present to ER, or call 911 if any life-threatening emergency . Follow up with me in 2-4 weeks at discretion  Benjamin Fries, PhD [place] Licensed Psychologist

## 2018-08-14 NOTE — Progress Notes (Signed)
2 clonazepam refills to [facility]

## 2018-08-28 ENCOUNTER — Ambulatory Visit: Payer: Medicaid Other | Admitting: Psychiatry

## 2018-08-30 ENCOUNTER — Ambulatory Visit (INDEPENDENT_AMBULATORY_CARE_PROVIDER_SITE_OTHER): Payer: Medicaid Other | Admitting: Psychiatry

## 2018-08-30 DIAGNOSIS — G4701 Insomnia due to medical condition: Secondary | ICD-10-CM

## 2018-08-30 DIAGNOSIS — F3160 Bipolar disorder, current episode mixed, unspecified: Secondary | ICD-10-CM

## 2018-08-30 DIAGNOSIS — F411 Generalized anxiety disorder: Secondary | ICD-10-CM

## 2018-08-30 DIAGNOSIS — F4310 Post-traumatic stress disorder, unspecified: Secondary | ICD-10-CM

## 2018-08-30 DIAGNOSIS — F840 Autistic disorder: Secondary | ICD-10-CM

## 2018-08-30 DIAGNOSIS — F902 Attention-deficit hyperactivity disorder, combined type: Secondary | ICD-10-CM

## 2018-08-30 NOTE — Progress Notes (Signed)
Psychotherapy Progress Note [facility]  Patient ID: [name]     MRN: [id number]      Date: 08/30/2018     Start: 2:23p Stop: 1:13p Time Spent: 50 min Therapy format: Individual psychotherapy  Session narrative -- presenting needs, interim history, self-report of stressors and symptoms, applications of prior therapy, status changes, and interventions in session Arrived 20 minutes late.  "No game plan" today, but notes a "bad health day" and recent concern with mother.  Concerned with family, including sister, who hasn't spoken to him in over a decade, and mother, for whom it has been a couple years now, except for a birthday card.  Wants to share an idea with sister for supporting herself in the face of a disabling illness, but tense and tangled up in framing his message.  Advised on brevity, positive tone, culling out obsessive or resentful detail, and making very sure he doesn't let hopes get up high for response, just make the friendly offer.  Drafted an email in session.  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  No news on CST arrangements.   Understood to be in PCP's hands, PT's responsibility to inquire if he wants to know more, and to consent if he wants behavioral health services involved.  Left it at request to let us know whatever our next steps should be as the time comes.  Working with a Patent examiner who is Information systems manager and trying to re-try his admission to the bar.  Believes at this stage tht it was not his father but more likely his mother and/or Meriel Flavors who scuttled his bar application several years ago by mentioning his assualt charge and incarceration at [facility].  Continue to support PT's freedom to ask the Bar if he can test for the  profession and, with a passing mark, be admitted.  Tremulous while explaining family matters.  Reveals Benedetto Goad driver asked a lot of intrusive questions, which initially felt threatening, recognized him as proselytizing, managed to parry the intrusion without turning combative.    Stressed with cuts to disabled services, notes a $42 transportation cost yesterday.   Becomes tearful briefly observing how hard it is to manage in society with a disability and fixed income.  Admittedly frightening.  Much effort to hold steady and suppress tears, and, though moments seemed like they could flash defensive, did not act out in any way.  Late in session made cryptic apology for history of harsh reactions.  Offered understanding, forgiveness, and assurance he is in good standing here.  Reveals hx growing up part time as Mormon (father's identified faith, mother worked for [employer], unclear if she kept with the belief system when she broke).  Saw relative(s) lobby her to join [employer] instead, feels he saw spiritual coercion, too much to trust any religion, at least Christian, but tries to be tolerant.  Indications he can feel more deeply and abruptly threatened than he lets on.  Communication in session reasonably goal-directed, less scattering.  Continued tendencies to hopscotch topics, less urgently than before, and to make vague references thinking implications are clear.  While still cryptic, less foreboding and more clearly oriented to constructive action rather than acting out anxiety or resentments.  Therapeutic modalities: Assertiveness/Communication, Psychologist, occupational and  Interpersonal  Mental Status/Observations:  Appearance:   Neat and Well Groomed     Behavior:  better eye contact, less consumed with computer, rapport fair-good  Motor:  Normal, continues steady w/o walker  Speech/Language:   Normal Rate and cryptic expression at times  Affect:  varied with subject, appropriate  Mood:   anxious and depressed  Thought process:  tangential and somewhat less  Thought content:    question of paranoia, certainly less intense  Sensory/Perceptual disturbances:    no indication  Orientation:  Intact  Attention:  Fair  Concentration:  Fair  Memory:  grossly intact  Insight:    Fair  Judgment:   Fair  Impulse Control:  Fair   Risk Assessment: Danger to Self:  No Self-injurious Behavior: No Danger to Others: No Duty to Warn:no Physical Aggression / Violence:No  Access to Firearms a concern: No   Diagnosis:   ICD-10-CM   1. 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 Generalized anxiety disorder F41.1   3. Attention deficit hyperactivity disorder (ADHD), combined type F90.2   4. Insomnia due to medical condition G47.01   5. PTSD (post-traumatic stress disorder) F43.10    with residual tension, paranoid triggers   6. Autism spectrum disorder with accompanying intellectual disability without language impairment, requiring substantial support F84.0    Has in-home medicaid services, last checked Case management -- or an approximation -- through [facility]    Assessment of progress:  improving  Plan:  . Recommendations/advice as noted above . Emphasize approach of leading horses to water without needing to make them drink, esp. Family -- no demands, just invitations, and pity if they decline . OK with continuing to work with life coach if arrangements are clear and affordable . Request inform of CST if/as it materializes . Continue to utilize previously learned skills ad lib . Maintain medication, if prescribed, and work faithfully with relevant prescriber(s) . Call the clinic on-call service, present to ER, or call 911 if any life-threatening emergency . Follow up with me in about 2-4 weeks at discretion    Robley Fries, PhD [place] Licensed Psychologist

## 2018-09-06 ENCOUNTER — Ambulatory Visit (INDEPENDENT_AMBULATORY_CARE_PROVIDER_SITE_OTHER): Payer: Self-pay | Admitting: Psychiatry

## 2018-09-06 DIAGNOSIS — G4700 Insomnia, unspecified: Secondary | ICD-10-CM

## 2018-09-06 DIAGNOSIS — F5105 Insomnia due to other mental disorder: Secondary | ICD-10-CM

## 2018-09-06 DIAGNOSIS — F900 Attention-deficit hyperactivity disorder, predominantly inattentive type: Secondary | ICD-10-CM

## 2018-09-06 DIAGNOSIS — F3160 Bipolar disorder, current episode mixed, unspecified: Secondary | ICD-10-CM

## 2018-09-06 DIAGNOSIS — F411 Generalized anxiety disorder: Secondary | ICD-10-CM

## 2018-09-06 NOTE — Progress Notes (Signed)
Psychotherapy Progress Note [facility]  Patient ID: [name]     MRN: [id number]      Therapy format: Individual psychotherapy Date: 09/06/2018     Start: 11:22a Stop: 12:12p Time Spent: 50 min  Session narrative -- presenting needs, interim history, self-report of stressors and symptoms, applications of prior therapy, status changes, and interventions in session Unusual morning session.  Still hard to get up, but scheduled this and made it as part of effort to make circadian rhythm better.  Mother emailed, out of the blue, asking if funds came through that she deposited in his [employer] account.  Discussed appraisal of it -- very wary, but did tell her thank you.  Obviously moved, either by nondistressed communication and/or her own conscientious impulse to restore some relationship, to be more helpful.  Clear he has laid off for a long time now from urgent appeals to her which in times past seemed only to sensitize her to whether she was being manipulated.    Discussed options, framing further thank you and simple question whether she'd like to be more in touch or option of noting that he had come to believe she was finished with him (more intimate, slight risk of sounding shaming).  Did send message from last session to sister Chrissie, no response.  Discussed renewed possibility of contacting mother on his behalf for background and openness to case coordination in the event of need.    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  Partnering with a Set designer, Barrister's clerk.  Sleep times still vary widely, from 45 min to 7 hrs.  Est 1 in 4 nights successful at getting electronics out of his vision 1 hr or more before bedtime.      Therapeutic modalities: Cognitive Behavioral Therapy  Mental Status/Observations:  Appearance:   Neat and Well Groomed     Behavior:   Appropriate and quiet  Motor:  Normal  Speech/Language:   some mumbling, softer of tone  Affect:  Congruent, Constricted and Flat  Mood:  depressed  Thought process:  tangential and less so, but some scatttering  Thought content:    WNL and no delusions nor paranoia, some compulsive (?) staements emphasizing forgivenessm gentlleness, if also prone to obsess about mother's mindset, motives  Sensory/Perceptual disturbances:    none evident  Orientation:  intact  Attention:  Good  Concentration:  Fair  Memory:  grossly intact  Insight:    Fair  Judgment:   Good  Impulse Control:  Good   Risk Assessment: Danger to Self:  No Self-injurious Behavior: No Danger to Others: No Duty to Warn:no Physical Aggression / Violence:No  Access to Firearms a concern: No   Diagnosis:   ICD-10-CM   1. Mixed bipolar I disorder (HCC) F31.60    Currently more depressive but stable  2. Attention deficit hyperactivity disorder (ADHD), predominantly inattentive type F90.0   3. 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 Generalized anxiety disorder F41.1     Assessment of progress:  stable  Plan:  . Recommendations/advice as noted above . Simple reply to mother, simply ask if she wants to be in more touch now.  Option to mention having thought she was finished. . OK to pursue art-related business.  Pt understands his prospects and abilities better than I and is free to try entrepreneurship. . Continue to utilize previously learned skills ad  lib . Maintain medication, if prescribed, and work faithfully with relevant prescriber(s) . Call the clinic on-call service, present to ER, or call 911 if any life-threatening emergency . Return in about 2 weeks (around 09/20/2018).  . Need ROI for [place] to call mother, Levian Arrieta, ph. [phone], for background information and to assess wishes/openness to case coordination.  Verbal consent given in session.  Option [place] to  call to establish liaison and assess openness to family coordination.   Robley Fries, PhD [place] Licensed Psychologist

## 2018-09-11 ENCOUNTER — Ambulatory Visit: Payer: Medicaid Other | Admitting: Psychiatry

## 2018-09-12 ENCOUNTER — Telehealth: Payer: Self-pay | Admitting: Psychiatry

## 2018-09-12 ENCOUNTER — Encounter (HOSPITAL_COMMUNITY): Payer: Self-pay

## 2018-09-12 ENCOUNTER — Other Ambulatory Visit: Payer: Self-pay

## 2018-09-12 ENCOUNTER — Emergency Department (HOSPITAL_COMMUNITY)
Admission: EM | Admit: 2018-09-12 | Discharge: 2018-09-15 | Disposition: A | Discharge status: Home / Self Care | Payer: Medicaid Other | Attending: Emergency Medicine | Admitting: Emergency Medicine

## 2018-09-12 DIAGNOSIS — F1120 Opioid dependence, uncomplicated: Secondary | ICD-10-CM | POA: Diagnosis not present

## 2018-09-12 DIAGNOSIS — F1220 Cannabis dependence, uncomplicated: Secondary | ICD-10-CM | POA: Insufficient documentation

## 2018-09-12 DIAGNOSIS — R45851 Suicidal ideations: Secondary | ICD-10-CM

## 2018-09-12 DIAGNOSIS — R4689 Other symptoms and signs involving appearance and behavior: Secondary | ICD-10-CM

## 2018-09-12 DIAGNOSIS — Z79899 Other long term (current) drug therapy: Secondary | ICD-10-CM | POA: Insufficient documentation

## 2018-09-12 DIAGNOSIS — F23 Brief psychotic disorder: Secondary | ICD-10-CM | POA: Diagnosis not present

## 2018-09-12 LAB — COMPREHENSIVE METABOLIC PANEL
ALT: 34 U/L (ref 0–44)
AST: 32 U/L (ref 15–41)
Albumin: 4.6 g/dL (ref 3.5–5.0)
Alkaline Phosphatase: 75 U/L (ref 38–126)
Anion gap: 15 (ref 5–15)
BUN: 11 mg/dL (ref 6–20)
CO2: 21 mmol/L — ABNORMAL LOW (ref 22–32)
Calcium: 10.2 mg/dL (ref 8.9–10.3)
Chloride: 101 mmol/L (ref 98–111)
Creatinine, Ser: 1.05 mg/dL (ref 0.61–1.24)
GFR calc non Af Amer: >60 mL/min (ref >60)
Glucose, Bld: 133 mg/dL — ABNORMAL HIGH (ref 70–99)
Potassium: 3.6 mmol/L (ref 3.5–5.1)
Sodium: 137 mmol/L (ref 135–145)
TOTAL PROTEIN: 8.2 g/dL — AB (ref 6.5–8.1)
Total Bilirubin: 0.6 mg/dL (ref 0.3–1.2)

## 2018-09-12 LAB — RAPID URINE DRUG SCREEN, HOSP PERFORMED
Amphetamines: POSITIVE — AB
BARBITURATES: NOT DETECTED
Benzodiazepines: POSITIVE — AB
Cocaine: NOT DETECTED
Opiates: POSITIVE — AB
Tetrahydrocannabinol: POSITIVE — AB

## 2018-09-12 LAB — CBC
HCT: 45.3 % (ref 39.0–52.0)
Hemoglobin: 14.9 g/dL (ref 13.0–17.0)
MCH: 27.9 pg (ref 26.0–34.0)
MCHC: 32.9 g/dL (ref 30.0–36.0)
MCV: 84.7 fL (ref 80.0–100.0)
Platelets: 433 10*3/uL — ABNORMAL HIGH (ref 150–400)
RBC: 5.35 MIL/uL (ref 4.22–5.81)
RDW: 12.9 % (ref 11.5–15.5)
WBC: 8.7 10*3/uL (ref 4.0–10.5)
nRBC: 0 % (ref 0.0–0.2)

## 2018-09-12 LAB — ETHANOL: Alcohol, Ethyl (B): <10 mg/dL (ref <10)

## 2018-09-12 LAB — CK: Total CK: 412 U/L — ABNORMAL HIGH (ref 49–397)

## 2018-09-12 LAB — ACETAMINOPHEN LEVEL: Acetaminophen (Tylenol), Serum: <10 ug/mL — ABNORMAL LOW (ref 10–30)

## 2018-09-12 LAB — SALICYLATE LEVEL: Salicylate Lvl: <7 mg/dL (ref 2.8–30.0)

## 2018-09-12 MED ORDER — HYOSCYAMINE SULFATE ER 0.375 MG PO TB12
0.3750 mg | ORAL_TABLET | Freq: Two times a day (BID) | ORAL | Status: DC
  Administered 2018-09-12 – 2018-09-15 (×6): 0.375 mg via ORAL
  Filled 2018-09-12 (×8): qty 1

## 2018-09-12 MED ORDER — LOPERAMIDE HCL 2 MG PO CAPS
2.0000 mg | ORAL_CAPSULE | Freq: Two times a day (BID) | ORAL | Status: DC
  Administered 2018-09-12 – 2018-09-15 (×6): 2 mg via ORAL
  Filled 2018-09-12 (×6): qty 1

## 2018-09-12 MED ORDER — LAMOTRIGINE 150 MG PO TABS
300.0000 mg | ORAL_TABLET | Freq: Every day | ORAL | Status: DC
  Administered 2018-09-12 – 2018-09-15 (×4): 300 mg via ORAL
  Filled 2018-09-12 (×4): qty 2

## 2018-09-12 MED ORDER — GABAPENTIN 600 MG PO TABS
600.0000 mg | ORAL_TABLET | Freq: Three times a day (TID) | ORAL | Status: DC
  Administered 2018-09-12 – 2018-09-15 (×10): 600 mg via ORAL
  Filled 2018-09-12 (×10): qty 1

## 2018-09-12 MED ORDER — PREGABALIN 50 MG PO CAPS
100.0000 mg | ORAL_CAPSULE | Freq: Three times a day (TID) | ORAL | Status: DC
  Administered 2018-09-12 – 2018-09-15 (×10): 100 mg via ORAL
  Filled 2018-09-12 (×10): qty 2

## 2018-09-12 MED ORDER — ZIPRASIDONE HCL 20 MG PO CAPS
40.0000 mg | ORAL_CAPSULE | Freq: Two times a day (BID) | ORAL | Status: DC
  Administered 2018-09-12 – 2018-09-15 (×6): 40 mg via ORAL
  Filled 2018-09-12 (×6): qty 2

## 2018-09-12 MED ORDER — LORAZEPAM 1 MG PO TABS
2.0000 mg | ORAL_TABLET | Freq: Once | ORAL | Status: AC
  Administered 2018-09-12: 2 mg via ORAL
  Filled 2018-09-12: qty 2

## 2018-09-12 MED ORDER — HYDROXYZINE HCL 50 MG PO TABS
50.0000 mg | ORAL_TABLET | Freq: Four times a day (QID) | ORAL | Status: DC
  Administered 2018-09-12 – 2018-09-15 (×11): 50 mg via ORAL
  Filled 2018-09-12 (×11): qty 1

## 2018-09-12 MED ORDER — LORAZEPAM 2 MG/ML IJ SOLN
2.0000 mg | Freq: Once | INTRAMUSCULAR | Status: AC
  Administered 2018-09-12: 2 mg via INTRAMUSCULAR
  Filled 2018-09-12: qty 1

## 2018-09-12 MED ORDER — TRAZODONE HCL 50 MG PO TABS
150.0000 mg | ORAL_TABLET | Freq: Every day | ORAL | Status: DC
  Administered 2018-09-12 – 2018-09-14 (×3): 150 mg via ORAL
  Filled 2018-09-12 (×3): qty 1

## 2018-09-12 MED ORDER — AZATHIOPRINE 50 MG PO TABS
50.0000 mg | ORAL_TABLET | Freq: Every day | ORAL | Status: DC
  Administered 2018-09-12 – 2018-09-15 (×4): 50 mg via ORAL
  Filled 2018-09-12 (×4): qty 1

## 2018-09-12 MED ORDER — CLONAZEPAM 0.5 MG PO TABS
1.0000 mg | ORAL_TABLET | Freq: Three times a day (TID) | ORAL | Status: DC
  Administered 2018-09-12 – 2018-09-15 (×10): 1 mg via ORAL
  Filled 2018-09-12 (×11): qty 2

## 2018-09-12 NOTE — Telephone Encounter (Signed)
I don't know who [facility] is.  Can you clarify?

## 2018-09-12 NOTE — ED Notes (Signed)
IVC papers served earlier - copy faxed to [facility], copy sent to Medical Records, original placed in folder for Magistrate, and all 3 sets on clipboard. Gray arm band applied to pt's wrist.

## 2018-09-12 NOTE — ED Provider Notes (Addendum)
[facility] EMERGENCY DEPARTMENT Provider Note   CSN: [id number] Arrival date & time: 09/12/18  1050     History   Chief Complaint Chief Complaint  Patient presents with  . Psychiatric Evaluation  . Suicidal  Level 5 caveat psychiatric complaint patient does not answer all questions.  HPI [name] is a 38 y.o. male.  Patient states "I am suffering from anxiety" he denies suicidal ideations however will not answer all questions.  Further history from Thalia Bloodgood, his therapist by telephone reports the patient was psychotic today when she went for home visit.  He did not answer all questions he did not recognize Ms. Davis when she went to visit him.  He has been noncompliant with his psychiatric medications.  He tends to hoard his medication she is also been smoking marijuana and using opium syrup.  He he has not been eating or drinking or caring for himself.  He insects were found in his refrigerator.  HPI  Past Medical History:  Diagnosis Date  . ADHD (attention deficit hyperactivity disorder)   . Anxiety   . Bipolar 1 disorder (HCC)   . Depression   . Encephalopathy acute 11/18/2015  . Excessive weight loss   . IBS (irritable bowel syndrome)   . Insomnia   . Ulcerative colitis     Patient Active Problem List   Diagnosis Date Noted  . Financial difficulties 12/24/2017  . Dental infection 10/20/2016  . Nausea 05/31/2016  . Rectal bleeding 05/28/2016  . Borderline abnormal TFTs 04/20/2016  . Other social stressor 01/28/2016  . Falls 12/19/2015  . Visual acuity reduced 12/19/2015  . Orthostatic hypotension 11/18/2015  . Encephalopathy acute 11/18/2015  . Limited mobility 10/27/2015  . Abdominal pain 09/12/2015  . Mixed bipolar I disorder (HCC) 03/18/2015  . Feeding difficulties and mismanagement 07/20/2013  . Immunocompromised state (HCC) 07/20/2013  . Protein-calorie malnutrition, severe (HCC) 07/09/2013  . EKG, abnormal 05/14/2012    . ADHD (attention deficit hyperactivity disorder) 05/12/2012  . Insomnia 05/06/2010  . HYPERCHOLESTEROLEMIA, PURE 06/14/2008  . Anxiety state 11/27/2007  . Ulcerative colitis (HCC) 04/26/2001    Past Surgical History:  Procedure Laterality Date  . ANAL FISSURECTOMY    . COLON SURGERY    . COLONOSCOPY W/ BIOPSIES     hx ulcerative colitis        Home Medications    Prior to Admission medications   Medication Sig Start Date End Date Taking? Authorizing Provider  amphetamine-dextroamphetamine (ADDERALL) 15 MG tablet Take 1 tablet by mouth 3 (three) times daily. 06/19/18   Cottle, Steva Ready., MD  amphetamine-dextroamphetamine (ADDERALL) 15 MG tablet Take 1 tablet by mouth 3 (three) times daily. 07/17/18   Cottle, Steva Ready., MD  amphetamine-dextroamphetamine (ADDERALL) 15 MG tablet Take 1 tablet by mouth 3 (three) times daily. 08/14/18   Cottle, Steva Ready., MD  azaTHIOprine (IMURAN) 50 MG tablet TAKE (3) TABLETS DAILY. Patient not taking: Reported on 10/10/2017 04/02/14   Louis Meckel, MD  clonazePAM (KLONOPIN) 1 MG tablet Take 1 tablet (1 mg total) by mouth 3 (three) times daily. 08/14/18   Cottle, Steva Ready., MD  EPINEPHrine (EPIPEN) 0.3 mg/0.3 mL SOAJ injection Inject 0.3 mLs (0.3 mg total) into the muscle as needed. Patient not taking: Reported on 06/19/2018 09/16/13   Elson Areas, PA-C  EPINEPHrine 0.3 mg/0.3 mL IJ SOAJ injection Inject into the muscle. 07/20/17   [provider]  folic acid (FOLVITE) 1 MG tablet  TAKE 1 TABLET EACH DAY. Patient not taking: Reported on 10/10/2017 04/02/14   Louis MeckelKaplan, Robert D, MD  gabapentin (NEURONTIN) 600 MG tablet Take 600 mg by mouth 3 (three) times daily.    [provider]  glycopyrrolate (ROBINUL) 2 MG tablet Take 1 tablet (2 mg total) by mouth 2 (two) times daily. Patient not taking: Reported on 10/10/2017 03/20/14   Iva BoopGessner, Carl E, MD  hydrOXYzine (ATARAX/VISTARIL) 50 MG tablet TAKE ONE TABLET BY MOUTH FOUR TIMES DAILY  06/05/18   [provider]  hyoscyamine (LEVBID) 0.375 MG 12 hr tablet Take 0.375 mg by mouth 2 (two) times daily.  02/22/14   [provider]  ibuprofen (ADVIL,MOTRIN) 200 MG tablet TAKE ONE TABLET BY MOUTH EVERY 4 HOURS 06/05/18   [provider]  lamoTRIgine (LAMICTAL) 150 MG tablet Take 300 mg by mouth daily.  01/26/18 01/26/19  [provider]  loperamide (IMODIUM) 2 MG capsule TAKE 2 (TWO) CAPSULES BY MOUTH FIVE TIMES A DAY 04/27/18   [provider]  metroNIDAZOLE (FLAGYL) 500 MG tablet TAKE ONE-HALF TABLET ( 250 MG TOTAL) BY MOUTH THREE TIMES PER DAY FOR 7 DAYS 03/08/18   [provider]  Multiple Vitamin (MULTIVITAMIN WITH MINERALS) TABS Take 1 tablet by mouth daily. As a nutritional supplement. 05/15/12   Readling, Curlene Labrumandy D, MD  oxyCODONE (OXY IR/ROXICODONE) 5 MG immediate release tablet Take 5 mg by mouth every 6 (six) hours as needed for severe pain.    [provider]  predniSONE (DELTASONE) 20 MG tablet Take 40 mg for two weeks and then decrease to 30 mg once daily until follow up visit Patient not taking: Reported on 10/10/2017 02/26/14   Esterwood, [name] S, PA-C  pregabalin (LYRICA) 100 MG capsule Take 100 mg by mouth 3 (three) times daily.  01/26/18 01/26/19  [provider]  Probiotic Product (VSL#3 DS) PACK Take 1 each by mouth 4 (four) times daily. 03/15/14   Louis MeckelKaplan, Robert D, MD  Probiotic Product (VSL#3 DS) PACK 2 packets by mouth twice a day 09/13/17   [provider]  promethazine (PHENERGAN) 25 MG tablet TAKE 1 TABLET EVERY 6 HOURS AS NEEDED FOR NAUSEA & VOMITING.    Louis MeckelKaplan, Robert D, MD  traZODone (DESYREL) 150 MG tablet Use 1 or 2 tabs at nightly as needed for insomnia. 06/05/18   [provider]  Vitamins/Minerals TABS Take by mouth. 05/15/12   [provider]  ziprasidone (GEODON) 40 MG capsule Take 40 mg by mouth 2 (two) times daily with a meal. 1 in AM and 2 in PM    [provider]  zolpidem (AMBIEN) 10 MG tablet Take 10 mg by mouth at bedtime.     [provider]    Family History Family History  Problem Relation Age of Onset  . Lymphoma Paternal Grandmother   . Leukemia Maternal Grandmother   . Colon cancer Neg Hx     Social History Social History   Tobacco Use  . Smoking status: Never Smoker  . Smokeless tobacco: Never Used  Substance Use Topics  . Alcohol use: No  . Drug use: No     Allergies   Cephalexin   Review of Systems Review of Systems  Unable to perform ROS: Psychiatric disorder  Allergic/Immunologic: Positive for immunocompromised state.       Immunocompromise state per history  Psychiatric/Behavioral: The patient is nervous/anxious.      Physical Exam Updated Vital Signs BP (!) 149/125 (BP Location: Right Arm)  Pulse (!) 130   Temp 98.2 F (36.8 C) (Oral)   Resp 20   SpO2 100%   Physical Exam Vitals signs and nursing note reviewed.  Constitutional:      Appearance: He is well-developed.     Comments: Tearful  HENT:     Head: Normocephalic and atraumatic.  Eyes:     Conjunctiva/sclera: Conjunctivae normal.     Pupils: Pupils are equal, round, and reactive to light.  Neck:     Musculoskeletal: Neck supple.     Thyroid: No thyromegaly.     Trachea: No tracheal deviation.  Cardiovascular:     Rate and Rhythm: Regular rhythm. Tachycardia present.     Heart sounds: No murmur.  Pulmonary:     Effort: Pulmonary effort is normal.     Breath sounds: Normal breath sounds.  Abdominal:     General: Bowel sounds are normal. There is no distension.     Palpations: Abdomen is soft.     Tenderness: There is no abdominal tenderness.  Musculoskeletal: Normal range of motion.        General: No tenderness.     Comments: Moves all extremities.  Motor strength 5/5 overall  Skin:    General: Skin is warm and dry.     Findings: No rash.  Neurological:     Mental Status: He is alert.     Coordination: Coordination  normal.  Psychiatric:     Comments: Anxious appearing      ED Treatments / Results  Labs (all labs ordered are listed, but only abnormal results are displayed) Labs Reviewed  COMPREHENSIVE METABOLIC PANEL  ETHANOL  SALICYLATE LEVEL  ACETAMINOPHEN LEVEL  CBC  RAPID URINE DRUG SCREEN, HOSP PERFORMED    EKG None  Radiology No results found.  Procedures Procedures (including critical care time)  Medications Ordered in ED Medications - No data to display   Initial Impression / Assessment and Plan / ED Course  I have reviewed the triage vital signs and the nursing notes.  Pertinent labs & imaging results that were available during my care of the patient were reviewed by me and considered in my medical decision making (see chart for details).     IVC affidavit and first exam form filled out by me this patient felt to be a danger to himself 12:10 PM patient remains agitated after treatment with oral Ativan.  He will not allow to have his blood drawn.  IM Ativan ordered.   1:05 PM patient appears somewhat more comfortable after treatment with IM Ativan.  He is somewhat argumentative with questioning upon asking why he was here he asked if I spoke with his "team" and refused to answer questions stating "do you want me to write an ESSSAY or something? Results for orders placed or performed during the hospital encounter of 09/12/18  Comprehensive metabolic panel  Result Value Ref Range   Sodium 137 135 - 145 mmol/L   Potassium 3.6 3.5 - 5.1 mmol/L   Chloride 101 98 - 111 mmol/L   CO2 21 (L) 22 - 32 mmol/L   Glucose, Bld 133 (H) 70 - 99 mg/dL   BUN 11 6 - 20 mg/dL   Creatinine, Ser 1.61 0.61 - 1.24 mg/dL   Calcium 09.6 8.9 - 04.5 mg/dL   Total Protein 8.2 (H) 6.5 - 8.1 g/dL   Albumin 4.6 3.5 - 5.0 g/dL   AST 32 15 - 41 U/L   ALT 34 0 - 44 U/L  Alkaline Phosphatase 75 38 - 126 U/L   Total Bilirubin 0.6 0.3 - 1.2 mg/dL   GFR calc non Af Amer >60 >60 mL/min   GFR calc  Af Amer >60 >60 mL/min   Anion gap 15 5 - 15  Ethanol  Result Value Ref Range   Alcohol, Ethyl (B) <10 <10 mg/dL  Salicylate level  Result Value Ref Range   Salicylate Lvl <7.0 2.8 - 30.0 mg/dL  Acetaminophen level  Result Value Ref Range   Acetaminophen (Tylenol), Serum <10 (L) 10 - 30 ug/mL  cbc  Result Value Ref Range   WBC 8.7 4.0 - 10.5 K/uL   RBC 5.35 4.22 - 5.81 MIL/uL   Hemoglobin 14.9 13.0 - 17.0 g/dL   HCT 16.145.3 09.639.0 - 04.552.0 %   MCV 84.7 80.0 - 100.0 fL   MCH 27.9 26.0 - 34.0 pg   MCHC 32.9 30.0 - 36.0 g/dL   RDW 40.912.9 81.111.5 - 91.415.5 %   Platelets 433 (H) 150 - 400 K/uL   nRBC 0.0 0.0 - 0.2 %  CK  Result Value Ref Range   Total CK 412 (H) 49 - 397 U/L   No results found.    3:20 PM patient is alert ambulatory Glasgow Coma Score 15.  Lab work is remarkable for mild rhabdomyolysis which is mild and does not require further treatment pt Is medically clear for psychiatric evaluation   Final Clinical Impressions(s) / ED Diagnoses  Diagnosis #1 aggressive behavior #2 medication noncompliance #3self neglect #4 elevated blood pressure Final diagnoses:  None   CRITICAL CARE Performed by: Doug SouSam Janell Keeling Total critical care time: 45 minutes Critical care time was exclusive of separately billable procedures and treating other patients. Critical care was necessary to treat or prevent imminent or life-threatening deterioration. Critical care was time spent personally by me on the following activities: development of treatment plan with patient and/or surrogate as well as nursing, discussions with consultants, evaluation of patient's response to treatment, examination of patient, obtaining history from patient or surrogate, ordering and performing treatments and interventions, ordering and review of laboratory studies, ordering and review of radiographic studies, pulse oximetry and re-evaluation of patient's condition. ED Discharge Orders    None       Doug SouJacubowitz, Dorita Rowlands,  MD 09/12/18 1524    Doug SouJacubowitz, Floride Hutmacher, MD 09/12/18 [phone]

## 2018-09-12 NOTE — ED Notes (Signed)
Ephriam Knuckles can be reached at [phone] and Thalia Bloodgood can be reached at [phone] who the pt's community support team/therapist. They won't to be reached and updated on pt's treatment plan.

## 2018-09-12 NOTE — ED Notes (Signed)
Pt sitting on bed in room. Remote for tv given to pt.

## 2018-09-12 NOTE — ED Notes (Signed)
[place], [facility], advised will ensure pt is on their TTS list.

## 2018-09-12 NOTE — ED Notes (Signed)
Patient appears to be having withdrawals from drugs at this time; pt is pacing the room and demanding wipes which the ED does not carry; pt is very anxious and it talking in word salads; Pt has been to the bathroom several times tonight; Pt has been given night med which include something for anxious; pt c/o diaper rash and was provided barrier cream and wash clothes to use instead of toliet paper-Monique,RN

## 2018-09-12 NOTE — ED Notes (Signed)
Pt on phone at nurses' desk - leaving VM.

## 2018-09-12 NOTE — ED Notes (Signed)
Pt refusing blood draw at this time. Pt seen by Dr Shela Commons, ativan ordered and given. Pt placed in reclined in A hallway, given food and drink. Dr Shela Commons is going to IVC patient. Pt is pyschotic, shaking, becoming non cooperative.

## 2018-09-12 NOTE — ED Triage Notes (Addendum)
Pt is brought in by [employer] after being called by caseworker and stating that pt was having thoughts of ending his life, pt appears to be in psychosis, not taking his bipolar/anxiety meds. Pt denies SI/HI in triage. States he just recently joined a support team to help him. Pt is voluntary. Pt's caseworkers are here with him. Pt has not been eating/drinking appropriately. Pt lives alone in an apartment. Pt has been noted to be consuming hemp, opium syrup and smoking weed. Pill bottles of amphetamines, gabapentin found in pt's apartment, most were full but it's possible that pt has been opening packages and just taking what he wants. Pt is tachy in triage and hypertensive but also appears anxious.

## 2018-09-12 NOTE — ED Notes (Signed)
Pt continues to tell this RN that I am assaulting him by asking him questions. Pt initially not cooperative with taking IM Ativan until he saw police presence nearby. Sitter sitting next to pt.

## 2018-09-12 NOTE — Telephone Encounter (Signed)
[facility] left from [facility] to [name]. No other info forwarded.

## 2018-09-12 NOTE — BH Assessment (Addendum)
Tele Assessment Note   Patient Name: [name] MRN: [id number] Referring Physician:   [facility] Location of Provider: Behavioral Health TTS Department  [name] is an 38 y.o. male.  The pt came in after being referred by his CST.  The pt was and is talking incoherently.  The pt jumped from topic to topic.  He started talking about having to take something to an art show for someone.  The pt then started talking about how [place] is nice and then he mentioned they have war.  The pt continued to talk like this and was not redirectable.  The pt denies SI, HI and psychosis. It is unclear if the pt has been inpatient in the past.   The pt lives alone.  According to his CST worker, the pt doesn't have much food at home. Today was his second visit.  The pt's first visit was Saturday and the pt was coherent at that time.  They CST worker said they found a pipe and not sure of the substances.  The pt's UDS is positive for opiates, benzodiazapines, amphetamines and marijuana.  The pt is prescribed adderall and Klonapin   According to the CST worker the pt has a hoarding problem.  Pt is dressed in scrubs. He is alert and oriented x4. Pt speaks in a clear tone, at moderate volume and normal pace. Eye contact is poor. Pt's mood is irritated. Thought process is incoherent and irrelevant.?Pt was not cooperative throughout assessment.    Diagnosis: F23 Brief psychotic disorder F11.20 Opioid use disorder, Moderate  F12.20 Cannabis use disorder, Moderate  Past Medical History:  Past Medical History:  Diagnosis Date  . ADHD (attention deficit hyperactivity disorder)   . Anxiety   . Bipolar 1 disorder (HCC)   . Depression   . Encephalopathy acute 11/18/2015  . Excessive weight loss   . IBS (irritable bowel syndrome)   . Insomnia   . Ulcerative colitis     Past Surgical History:  Procedure Laterality Date  . ANAL FISSURECTOMY    . COLON SURGERY    .  COLONOSCOPY W/ BIOPSIES     hx ulcerative colitis    Family History:  Family History  Problem Relation Age of Onset  . Lymphoma Paternal Grandmother   . Leukemia Maternal Grandmother   . Colon cancer Neg Hx     Social History:  reports that he has never smoked. He has never used smokeless tobacco. He reports that he does not drink alcohol or use drugs.  Additional Social History:  Alcohol / Drug Use Pain Medications: See MAR Prescriptions: See MAR Over the Counter: See MAR History of alcohol / drug use?: Yes Substance #1 Name of Substance 1: marijuana 1 - Age of First Use: UTA 1 - Amount (size/oz): UTA 1 - Frequency: UTA 1 - Duration: UTA 1 - Last Use / Amount: UTA Substance #2 Name of Substance 2: opium syrup 2 - Age of First Use: UTA 2 - Amount (size/oz): UTA 2 - Frequency: UTA 2 - Duration: UTA 2 - Last Use / Amount: UTA  CIWA: CIWA-Ar BP: (!) 117/91 Pulse Rate: 100 COWS:    Allergies:  Allergies  Allergen Reactions  . Cephalexin     REACTION: urticaria (hives)    Home Medications: (Not in a hospital admission)   OB/GYN Status:  No LMP for male patient.  General Assessment Data Assessment unable to be completed: Yes Reason for not completing assessment: Multiple walk-ins  at [facility] Location of Assessment: [facility] ED TTS Assessment: In system Is this a Tele or Face-to-Face Assessment?: Tele Assessment Is this an Initial Assessment or a Re-assessment for this encounter?: Initial Assessment Patient Accompanied by:: N/A Language Other than English: No Living Arrangements: Other (Comment)(home) What gender do you identify as?: Male Marital status: Single Maiden name: NA Living Arrangements: Alone Can pt return to current living arrangement?: Yes Admission Status: Involuntary Petitioner: ED Attending Is patient capable of signing voluntary admission?: No Referral Source: MD Insurance type: Medicaid     Crisis Care Plan Living Arrangements: Alone Legal  Guardian: Other:(Self) Name of Psychiatrist: [facility] Name of Therapist: [facility]     Risk to self with the past 6 months Suicidal Ideation: No Has patient been a risk to self within the past 6 months prior to admission? : No Suicidal Intent: No Has patient had any suicidal intent within the past 6 months prior to admission? : No Is patient at risk for suicide?: No Suicidal Plan?: No Has patient had any suicidal plan within the past 6 months prior to admission? : No Access to Means: No What has been your use of drugs/alcohol within the last 12 months?: possible marijuana and opium? Previous Attempts/Gestures: No(UTA) How many times?: 0(UTA) Other Self Harm Risks: (UTA) Triggers for Past Attempts: Other (Comment)(none none) Intentional Self Injurious Behavior: None Family Suicide History: Unable to assess Recent stressful life event(s): Other (Comment)(wants to take the bar exam) Persecutory voices/beliefs?: No Depression: No Substance abuse history and/or treatment for substance abuse?: Yes Suicide prevention information given to non-admitted patients: Not applicable  Risk to Others within the past 6 months Homicidal Ideation: No Does patient have any lifetime risk of violence toward others beyond the six months prior to admission? : No Thoughts of Harm to Others: No Current Homicidal Intent: No Current Homicidal Plan: No Access to Homicidal Means: No Identified Victim: NA History of harm to others?: No Assessment of Violence: None Noted Violent Behavior Description: NA Does patient have access to weapons?: No Criminal Charges Pending?: No Does patient have a court date: No Is patient on probation?: No  Psychosis Hallucinations: None noted Delusions: Unspecified  Mental Status Report Appearance/Hygiene: Unremarkable, In scrubs Eye Contact: Poor Motor Activity: Freedom of movement, Unremarkable Speech: Incoherent Level of Consciousness: Restless Mood:  Anxious Affect: Appropriate to circumstance Anxiety Level: Moderate Thought Processes: Flight of Ideas Judgement: Impaired Orientation: Person, Place, Situation, Time Obsessive Compulsive Thoughts/Behaviors: None  Cognitive Functioning Concentration: Decreased Memory: Recent Intact, Remote Intact Is patient IDD: No Insight: Poor Impulse Control: Poor Appetite: Fair Have you had any weight changes? : No Change Sleep: Unable to Assess Vegetative Symptoms: None  ADLScreening [facility] Assessment Services) Patient's cognitive ability adequate to safely complete daily activities?: Yes Patient able to express need for assistance with ADLs?: Yes Independently performs ADLs?: Yes (appropriate for developmental age)  Prior Inpatient Therapy Prior Inpatient Therapy: (UTA)  Prior Outpatient Therapy Prior Outpatient Therapy: Yes Prior Therapy Dates: current Prior Therapy Facilty/Provider(s): [facility] Reason for Treatment: bipolar disorder Does patient have an ACCT team?: No Does patient have Intensive In-House Services?  : No Does patient have [facility] services? : No Does patient have P4CC services?: No  ADL Screening (condition at time of admission) Patient's cognitive ability adequate to safely complete daily activities?: Yes Patient able to express need for assistance with ADLs?: Yes Independently performs ADLs?: Yes (appropriate for developmental age)       Abuse/Neglect Assessment (Assessment to  be complete while patient is alone) Abuse/Neglect Assessment Can Be Completed: Unable to assess, patient is non-responsive or altered mental status     Advance Directives (For Healthcare) Does Patient Have a Medical Advance Directive?: No Would patient like information on creating a medical advance directive?: No - Patient declined          Disposition:  Disposition Initial Assessment Completed for this Encounter: Yes Patient referred to: (pending)   PA Donell Sievert  recommends the pt be observed overnight for safety and stabilization.  The pt is to be reassessed by psychiatry in the morning.   This service was provided via telemedicine using a 2-way, interactive audio and video technology.  Names of all persons participating in this telemedicine service and their role in this encounter. Name: Cathlean Marseilles Role: Pt  Name: Ephriam Knuckles Role: CST counselor  Name:  Role:   Name:  Role:     Ottis Stain 09/12/2018 8:33 PM

## 2018-09-12 NOTE — ED Notes (Addendum)
Pt arrived to Rm [facility] - ambulatory - wearing burgundy scrubs. Sitter w/pt. Pt attempted obtain urine specimen - unable - advised he will try again later. Pt refusing to sit in room d/t states it is too hot. Chair placed in doorway of room - pt sitting in chair.

## 2018-09-13 ENCOUNTER — Encounter (HOSPITAL_COMMUNITY): Payer: Self-pay | Admitting: Registered Nurse

## 2018-09-13 MED ORDER — ACETAMINOPHEN 325 MG PO TABS
650.0000 mg | ORAL_TABLET | Freq: Once | ORAL | Status: AC
  Administered 2018-09-13: 650 mg via ORAL
  Filled 2018-09-13: qty 2

## 2018-09-13 NOTE — ED Notes (Signed)
Breakfast tray ordered 

## 2018-09-13 NOTE — ED Notes (Signed)
Charted on wrong patient

## 2018-09-13 NOTE — ED Provider Notes (Signed)
38 year old male presents today with suicidality.  Please see previous providers note for full H&P.  Behavioral health recommends inpatient evaluation.  Vitals:   09/13/18 0839 09/13/18 0840  BP:  125/83  Pulse:  77  Resp:    Temp:    SpO2: 100%       Eyvonne Mechanic, PA-C 09/13/18 1203    Tilden Fossa, MD 09/13/18 [phone]

## 2018-09-13 NOTE — Consult Note (Signed)
  Tele Assessment   [name], 38 y.o., male patient presented to [facility] via law enforcement after being referred by his case worker with complaints that patient was presenting with psychotic features.  Patient seen via telepsych by this provider; chart reviewed and consulted with Dr. Lucianne Muss on 09/13/18.  On evaluation [name] reports "my CST team was concerned and that's why I'm here."  Patient was unable to say if he was hearing voices.  States "It is hard for me to tell.  I thought someone was watching me; that's why I unplugged my cameras.  Patient is not a good historian; he is unable to give clear information related to his situation.  He is aware that something is not right but unsure.  He denies auditory hallucinations and then states that he is not sure.  Patient denies suicidal/homicidal ideation, psychosis, and paranoia; but then states that he unplugged cameras because he thought someone was watching him through the cameras.  Patient states that he lives alone and doesn't have a good support sytem other that his CST team.  Reports that he is not sleeping or eating well.  Also states that he was not taking his medication as prescribed.      .     Recommendations:  Inpatient psychiatric treatment  Disposition: Recommend psychiatric Inpatient admission when medically cleared.   Spoke with Burna Forts, PA ; informed of above recommendation and disposition   Assunta Found, NP

## 2018-09-13 NOTE — Progress Notes (Signed)
Pt. meets criteria for inpatient treatment per Assunta Found, NP.  Referred out to the following hospitals: [facility]  [facility]  [facility]  [facility]  [facility]  [facility]  [facility]  [facility]  [facility]     Disposition CSW will continue to follow for placement.  Timmothy Euler. Kaylyn Lim, MSW, LCSWA Disposition Clinical Social Work [phone] (cell) [phone] (office)

## 2018-09-14 NOTE — Consult Note (Signed)
  Spoke with Irving Burton, RN who informs that patient states he is ready to go home.  Also spoke to patient over phone. Informed patient that there were some concerns about his paranoia.  States that he is feeling better; informed that he continued to have some paranoid thoughts yesterday.  States that he has services with [facility] who makes visits at his home.  Patient informed at this time would continue to seek placement but if a member of his team was able to visit and vitrify his baseline there was possibility to go home.   Dealie Koelzer B. Vianey Caniglia, NP

## 2018-09-14 NOTE — ED Notes (Signed)
Pt just now awake and eating, geodon to be taken with food. Per ED pharmacist okay to give 0800 and 1000 meds at the same time

## 2018-09-15 ENCOUNTER — Emergency Department (HOSPITAL_COMMUNITY): Payer: Medicaid Other

## 2018-09-15 MED ORDER — ZIPRASIDONE HCL 40 MG PO CAPS: 40.0000 mg | ORAL_CAPSULE | Freq: Two times a day (BID) | ORAL | 0 refills | Status: AC

## 2018-09-15 MED ORDER — GABAPENTIN 600 MG PO TABS: 600.0000 mg | ORAL_TABLET | Freq: Two times a day (BID) | ORAL | 0 refills | Status: AC

## 2018-09-15 MED ORDER — HYDROXYZINE HCL 50 MG PO TABS: 50.0000 mg | ORAL_TABLET | Freq: Four times a day (QID) | ORAL | 0 refills | Status: AC

## 2018-09-15 MED ORDER — LAMOTRIGINE 150 MG PO TABS: 300.0000 mg | ORAL_TABLET | Freq: Every day | ORAL | 0 refills | Status: AC

## 2018-09-15 MED ORDER — ACETAMINOPHEN 325 MG PO TABS
650.0000 mg | ORAL_TABLET | ORAL | Status: DC | PRN
  Administered 2018-09-15 (×2): 650 mg via ORAL
  Filled 2018-09-15 (×2): qty 2

## 2018-09-15 NOTE — ED Notes (Signed)
Patient transported to X-ray, Comptroller accompanying

## 2018-09-15 NOTE — ED Notes (Signed)
Left in care of CST team

## 2018-09-15 NOTE — Discharge Instructions (Signed)
The behavioral health specialist feel you are safe for discharge home.  They requested refills of your medications.  These were ordered.  Please follow-up with your outpatient psychiatry team as they recommended and if any symptoms change or worsen, please return the nearest emergency department immediately.

## 2018-09-15 NOTE — ED Provider Notes (Addendum)
38 y.o male with a PMH of Anxiety, please see previous provider note for a full HPI. Briefly, seen this morning by myself due to RN reporting patient has coughed and has a previous history of pneumonia,I examined patient and lungs were clear to auscultation but obtain chest x-ray to rule out any acute process.  Chest x-ray was negative for any consolidation.  He is overall well-appearing resting comfortably in bed this morning.  Patient is currently under the care of CST, will have staff perform a visual assessment. According to [facility], patient was taking only the Aderall on his pre packed medication, is requesting for me to order his psychiatric home meds. Will place call for [facility] for explanation.   2:55 PM Arvin Collard for medication recommendations x 3 on his personal cell along with behavioral unit no response. When patient is officially clear for discharge will wait psychiatric medication.  3:07 PM Spoke to [place], psychiatry request me refill patient's medications that he has been taking while stable with during hospital visit.I  will send patient home with prescription for lamictal, atarax, neurotin, and geodon for 1 week worth of medication pending his discharge and recommendations from psychiatry.    Claude Manges, PA-C 09/15/18 1508    Jacalyn Lefevre, MD 09/15/18 1515    Claude Manges, PA-C 09/15/18 1516    Jacalyn Lefevre, MD 09/15/18 1538

## 2018-09-15 NOTE — BH Assessment (Signed)
09/15/2018: Per Feliz Beam, patient's disposition is depending on collateral information from client's therapist Thalia Bloodgood. Unable to locate the therapist number in the notes. Patient stating that he has his therapist number in his cell phone. Feliz Beam has asked [facility] staff to assist with obtaining the number.

## 2018-09-15 NOTE — Progress Notes (Signed)
Patient is seen by me via tele-psych and I have consulted with Dr. Lucianne Muss.  Patient denies any suicidal homicidal ideations and denies any hallucinations.  Patient reports that he is possible that he was having some mental health issues at the beginning of the week as he was not taking his prescription medications as prescribed.  He also reports that he has been abusing some other drugs as well.  Patient reports that he is with [facility] team and will be able to provide [place] with a phone number to contact them.  I did reach his therapist with [facility], Thalia Bloodgood at [phone], and she stated that she is on her way from [facility] to verify that the patient is at baseline and that he is welcome to discharge with her if he is cleared.  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  She states that they would be able to dispose of the other medications in the home and they are in bubble packs which makes it difficult to separate them.  I attempted to contact the EDP to notify them of the recommendations but they were unavailable at the time.  I did notified the charge nurse about the plan and she stated that she had sent a message to the PA to give me a call.  With the therapist evaluating the patient is stating that he is at baseline upon her arrival and then will clear the patient psychiatrically and the patient can be discharged from the hospital.  But we will be waiting on that assessment from the therapist as she stated she will arrive in approximately 45 minutes that she is driving from [place].

## 2018-09-18 ENCOUNTER — Encounter: Payer: Self-pay | Admitting: Psychiatry

## 2018-09-18 NOTE — Progress Notes (Signed)
09/18/18  MyChart contact with PT to request he get in touch with his agency to better establish contact and coordination, since phone message from them was incomplete and unreturnable.  Reply via MyChart that the agency is [facility], they are administering his CST, he will get in touch with them.    Robley Fries, PhD

## 2018-09-19 ENCOUNTER — Ambulatory Visit: Payer: BLUE CROSS/BLUE SHIELD | Admitting: Psychiatry

## 2018-09-21 ENCOUNTER — Ambulatory Visit: Payer: Medicaid Other | Admitting: Psychiatry

## 2018-10-02 ENCOUNTER — Telehealth: Payer: Self-pay | Admitting: Psychiatry

## 2018-10-02 NOTE — Telephone Encounter (Signed)
Patient called requesting his medical records. He recently received the letter about medicaid. Please let me know if you will comply. He is saying if you don't comply he will call his lawyer.

## 2018-10-02 NOTE — Telephone Encounter (Signed)
Letter is in your box

## 2018-10-02 NOTE — Telephone Encounter (Signed)
Patient called and is requesting his medical records. He just received the letter about medicaid.So that is what is prompting this rerquest. Please let me know if you will comply. He says that he will call his lawyer if you don't comply.

## 2018-10-03 ENCOUNTER — Encounter: Payer: Self-pay | Admitting: Psychiatry

## 2018-10-03 NOTE — Progress Notes (Signed)
Admin Documentation Marliss Czar, PhD, [facility]  Patient ID: [name]     MRN: [id number]     Date: 10/03/2018       2/18 Received staff message (S.W.) about PT's new request (2/17) for records. Based on notes, appears to be in response to PT receiving a mailing from the practice about changing our billing policy, per [facility], to full price rather than out-of-pocket or negotiated rate for Medicaid recipients. Message includes new threat to involve an attorney if I/we do not comply.   This PT has been through at least 3 episodes before or demanding his records, for unclear reasons, and doing so in a particularly garbled and hostile manner, in at least one instance coming to the brink of termination.  History has involved at least two sessions where PT shouted down [place] trying to reckon with the issues involved, the clinical discretion of Drs. Arlow Spiers and [place], and the necessary procedures for self-release in this office.  As before, the purpose of release is unclear -- PT was informed by S.W. that we transmit records to new providers on request of new providers, but PT states he "just wants it".   As before, it is not clear whether PT agrees to his own responsibilities with regard to release, namely, to make clear the purpose, cover the cost of copying, and sign the attestation for self-release without modifications (previously modified).   As before, the clinical risk of release remains high -- PT has history of obsessing about records to his own detriment, both in terms of dreaming up conflict with people who help him (cf. the "schizophrenia" episode with [facility] clinic) and in terms of cluttering up his own space with documents enough to prevent effective organization at home, when he already has severe ADHD and physiological issues that compromise mental status and judgment, not to mention much more pertinent needs to stabilize income and work productively with social  service agencies who serve him.    As before, he is naturally concerned about money, but he uses the occasion to become hypervigilant, even paranoid, about his perceived rights.   As before, he loses sight of damage done to rapport any time he frontloads threats instead of asking straightforwardly. In the last several months, this issue seemed to be understood and overcome, but in times past he has come to the brink of termination at least twice, and other agencies have been noted before to either break ties with him or be fired by him for similar issues that were simultaneously contentious, litigious, poorly understood, and approached in a paranoid manner.   As before, a whole-record request, if clinically allowed, would be both very cumbersome and at a particularly unwise cost to him, as he has over 10 years of paper record on file now, as well as electronic record since October.     As before, PT's consent to ROI is cloudy at best. At last contact, PT gave verbal consent to communicate with the agency forming his [employer], [facility]. Office had received a message from [facility]" without any identifying information, so it is not clear who to talk to or what clearance we have to learn better what is going on in his care system nor clearance to inform other caregivers of any concerns or assistance we might offer.   Reportedly, PT has asked to schedule with me soon. I am willing to meet to try to better understand the need, gauge mental status,  and more clearly inform him how things work and what the ramifications are. In the interest of affordability and PT's acute financial need, and given the subsidy that PT still (?) receives from in-home Medicaid toward our previous discount rates, I am willing to underbill his appointment time to best match the prior cost of service.   Clinically, PT has made significant strides the past several months in physical self-care, anti-paranoid  thinking, improved communication and social skills working with care providers and staff here, and reducing dependence on high doses of competing psychoactive medications. If he is relapsing sufficiently in any of these problems, however, it will be clinically indicated to terminate and transfer.    Robley Fries, PhD

## 2018-10-05 ENCOUNTER — Ambulatory Visit (INDEPENDENT_AMBULATORY_CARE_PROVIDER_SITE_OTHER): Payer: Medicaid Other | Admitting: Psychiatry

## 2018-10-05 DIAGNOSIS — F5105 Insomnia due to other mental disorder: Secondary | ICD-10-CM

## 2018-10-05 DIAGNOSIS — F840 Autistic disorder: Secondary | ICD-10-CM

## 2018-10-05 DIAGNOSIS — F3160 Bipolar disorder, current episode mixed, unspecified: Secondary | ICD-10-CM

## 2018-10-05 DIAGNOSIS — G894 Chronic pain syndrome: Secondary | ICD-10-CM

## 2018-10-05 DIAGNOSIS — F4010 Social phobia, unspecified: Secondary | ICD-10-CM

## 2018-10-05 DIAGNOSIS — G4700 Insomnia, unspecified: Secondary | ICD-10-CM

## 2018-10-05 DIAGNOSIS — Z598 Other problems related to housing and economic circumstances: Secondary | ICD-10-CM

## 2018-10-05 DIAGNOSIS — F902 Attention-deficit hyperactivity disorder, combined type: Secondary | ICD-10-CM

## 2018-10-05 DIAGNOSIS — Z599 Problem related to housing and economic circumstances, unspecified: Secondary | ICD-10-CM

## 2018-10-05 NOTE — Progress Notes (Signed)
Psychotherapy Progress Note [facility] Luan Moore, PhD LP  Patient ID: [name]     MRN: [id number]     Therapy format: Individual psychotherapy Date: 10/05/2018     Start: 2:05p Stop: 2:55p Time Spent: 50 min --- Only billing this PT for 30 minute session to control costs.  The remaining time is considered a professional-courtesy donation of services. ---  Session narrative -- presenting needs, interim history, self-report of stressors and symptoms, applications of prior therapy, status changes, and interventions made in session Message earlier this week demanding records and threatening legal action, recognized to be in response to receiving notice that [facility] billing rules would no longer allow discounted or negotiated fee, which apparently precipitated despair, sense of abandonment, resentment.  Uncommunicated until later today, PT called back the same day to apologize and ask to schedule.  As of this session, presents very quietly, subdued, prone to mumble, says he does not feel well.  Wants to know what [place] "wants" so he will "know how to react", initially seeming ready to go into a kind of hostile-compliant orientation but did not.  Reaffirmed [place]'s commitment to work with him if he desires and hopefully find a way to make the work continue to be affordable.  Required several repetitions to establish PT understanding of what [place] and PT each need to do [facility] explore billing alternatives, PT check with his Medicaid representative about what can continue to be covered).  Seemed lost for a time to obsessive doubt and managing intrusive thoughts/feeligns of mistrust and despair, but eventually articulated and wrote down measures that [place] and office will try to take to do so.  Reiterated to PT full willingness to continue working with him -- implications being that his outburst Monday to staff and then his relatively quick apology very much count as progress in social skills,  a credible indication of his constructive attitude, and able to be taken benevolently by [place] (in therapeutic contradiction of his experience with parents) as temporary and a reflection of his mood disorder and/or autistic thought process.  Re. his living situation, PT remains on stringent fixed income, in subsidized housing, denies any current issues with any neighbors.  Dependent upon [employer] for transportation, inconvenienced by variable times, yesterday chided for asking service on the day before it is needed, when they need 4-7 days' notice, with cynical statements today about the welfare system in the [place].  In session, consulted the [facility]. website to clarify the ground rules for using the service, without real success, but informed PT there is a reservation portal there which he did not know about.  (Surprisingly so, since PT has always been so technology oriented, but in keeping with first-ever self-description that he is extremely socially anxious.)  Says his basic needs for food, shelter, and safety are all met to his satisfaction, but he is "resigned to the fact" that he will eventually need psychiatric hospitalization.  Unclear what he meant by saying so, but best understanding seems to be that he believes poverty statistics lay out some kind of destiny for him to fail.  Denies SI, HI, any behavioral conditions which would endanger health or safety, as well as any intuition or self-observation that he is trending psychotic or violent.  Informed I see no reason he would fit any criteria for institutionalization, but it would be understandable if he feels trapped.  Continues to report that his home care agency, [facility], is setting up a  Conservation officer, nature and alludes to having talked to lots of people from there, but unable to relate what they said, who is who, or what happens next.  Can not name an individual with whom to coordinate but says he'll just deal with it rather than authorize  ROI to [facility].  Has a (new?) peer support specialist through [facility], reluctant to say much.  Reports he has come to terms with inability to manage money and has requested [facility] establish a payee for him.  Believes he is not capable at this time of managing money without repeatedly making himself cash-strapped by mid-month.  Encouraged to try to list what his regular expenses are, if not as an attempt to budget, an attempt to inform his payee/trustee.    Discussed process for affording services, still anticipates subsidy for mental health treatment from In-home Medicaid.  Urged him to talk to Ms. Fulmore, his case Freight forwarder, to clarify ground rules for subsidy and make sure his needs are well-articulated.  Re. self-management skills, continues to use mindfulness meditation via a phone app, allegedly nearly every day but stating he does not do it like he "should".  (Obsessive standard, exaggerated use, or both?)    Personally, perhaps more revealing and vulnerable today than at any time the last 5 or so years, mentioning "severe social anxiety" and resignation about his abilities to manage money and even to maintain sanity, along with knowingly stating that he really does not trust anybody, said without hostility or guardedness.  As he displays no committable features nor criteria for institutionalization, I believe this is situational depression, exaggerated -- as it often can be -- by bipolar, obsessive, pain-driven, sleep-deprived, and/or autistic thinking.  Continues to c/o badly disrupted sleep, attributed mainly to anxiety/worry but also to surges of energy, thought to be bipolar, or at least circadian, in origin.  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  Says at least 2 nights a week he does not sleep at all, though this is difficult to gauge at this time.   Has made use of Apple watch readings in recent times, though it has not been clear PT could interpret or articulate its findings.  Re. worry, oriented PT to CBT strategies including scheduled worry time, worry journaling, and the constructive practice of imagining things he is already doing and things which others may be already doing for his need or problem.    PT attempted to pay full fee ($195) on credit card at checkout, though he was told his maximum obligation would be $150 depending on whether I could bill differently   Therapeutic modalities: Cognitive Behavioral Therapy, Systems analyst, Solution-Oriented/Positive Psychology and Ego-Supportive  Mental Status/Observations:  Appearance:   Casual, Fairly Groomed and back in sweat pants, unshaven, as before he began "dressing for success"   again  Behavior:  Sharing and subdued  Motor:  Normal  Speech/Language:   logical enough, and on point, but prone to mumble  Affect:  Depressed  Mood:  anxious and depressed  Thought process:  goal-directed enough, though ruminative  Thought content:    Rumination  Sensory/Perceptual disturbances:    WNL and denies AVH  Orientation:  Intact  Attention:  Fair  Concentration:  Fair  Memory:  not assessed, indications of compromised attention to life events  Insight:    Fair  Judgment:  Fair  Impulse Control:  Fair   Risk Assessment: Danger to Self:  No Self-injurious Behavior: No, though chronic risk of self-neglect and self-care errors Danger to Others: No Duty to Warn:no Physical Aggression / Violence:No  Access to Firearms a concern: No   Diagnosis:   ICD-10-CM   1. 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 Attention deficit hyperactivity disorder (ADHD), combined type F90.2   3. 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 Social anxiety disorder F40.10   6. Chronic pain syndrome G89.4   7. Financial difficulties Z59.8    Assessment of progress:  mixed -- improving clarity in judgment while worsening mood  Plan:  . [place] to obtain estimates of alternative services to reduce charges -- currently 45-min CPT 90834 is $195 by self-pay rate, CPT 90832 is $150, and CPT 96158 (HBAI-30) is $245.  Will work on finding other alternatives.  PT to ask Medicaid case worker (Ms. Fulmore) about subsidizing at $150 (highly unlikely). . PT try to adequately inform personal care agency of his true, full, and accurate needs and establish useful, viable coordination of care between [facility] and [facility]. . Work on sleep regulation as able, including sleep hygiene principles previously oriented and new priority on nighttime worry control practices . Continue to utilize previously learned skills ad lib . Maintain medication as prescribed and work faithfully with relevant prescriber(s) if any changes are desired or seem indicated . Call the clinic on-call service, present to ER, or call 911 if any life-threatening emergency Return in about 2 weeks (around 10/19/2018). recommended, with full right to cancel if unaffordable.   Blanchie Serve, PhD [place] Licensed Psychologist

## 2018-10-31 ENCOUNTER — Encounter: Payer: Self-pay | Admitting: Psychiatry

## 2018-10-31 NOTE — Progress Notes (Signed)
Admin note Marliss Czar, PhD, [facility]  Patient ID: [name]     MRN: [id number]     Date: 10/31/2018  Record request received 10/24/18 from the [facility] STEP program (an OP community care program of [facility]), but 11-year paper record is massive and likely confusing for their purposes, requested schedule a clinical call to better target needed information and questions.    TC today with Darsi Tien Bellow, NP, of the [facility] STEP program (direct line [phone]) to provide clinical background and current needs as known for PT, reportedly entering an OP program of [facility].   Gave dxs and developmental, medical, trauma, educational, and legal histories as able, along with complicated history of medication management, chronic difficulties organizing and sometimes trusting care, recent setbacks in financial management, limitations of his support system, and priority needs for organization of care, clearer communication between PT and care providers and between care providers, physiological stabilization (nutrition, sleep schedule), and social perception.  Although PT has not stated any particular plans to change providers, Ms. Smolko states that he told her he "cold-turkeyed" off all his medications.  Interpretation here that he was simultaneously trying to save money, eliminate his combined dependency on stimulant and sedative, and retire any further financial and social obligations (as perceived, anyway) with current providers.  Based on consent in hand, offered to be available to Ms. Smolko for any further questions as needed, decided together to transmit   Robley Fries, PhD

## 2019-08-12 IMAGING — DX DG CHEST 2V
3 series · 3 of 3 positions shown · non-contrast
Comparison: 11/18/2015

CLINICAL DATA: Cough and chest pain.

EXAM:
CHEST - 2 VIEW

[chest pa]
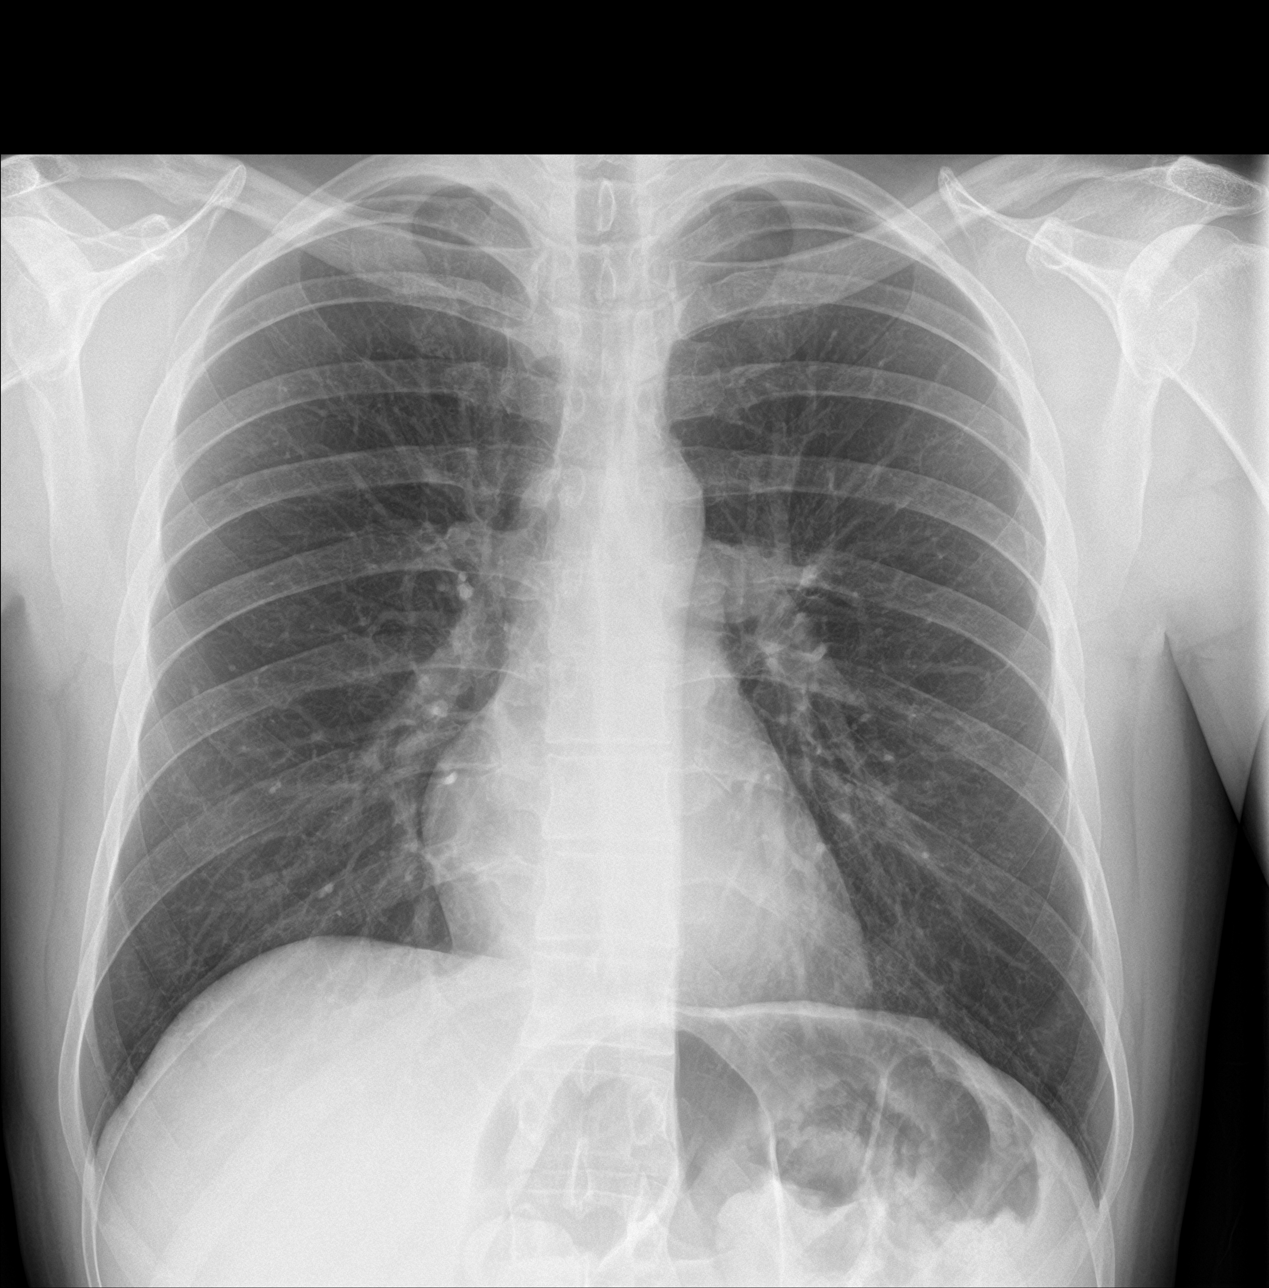

[chest lat (1 of 2)]
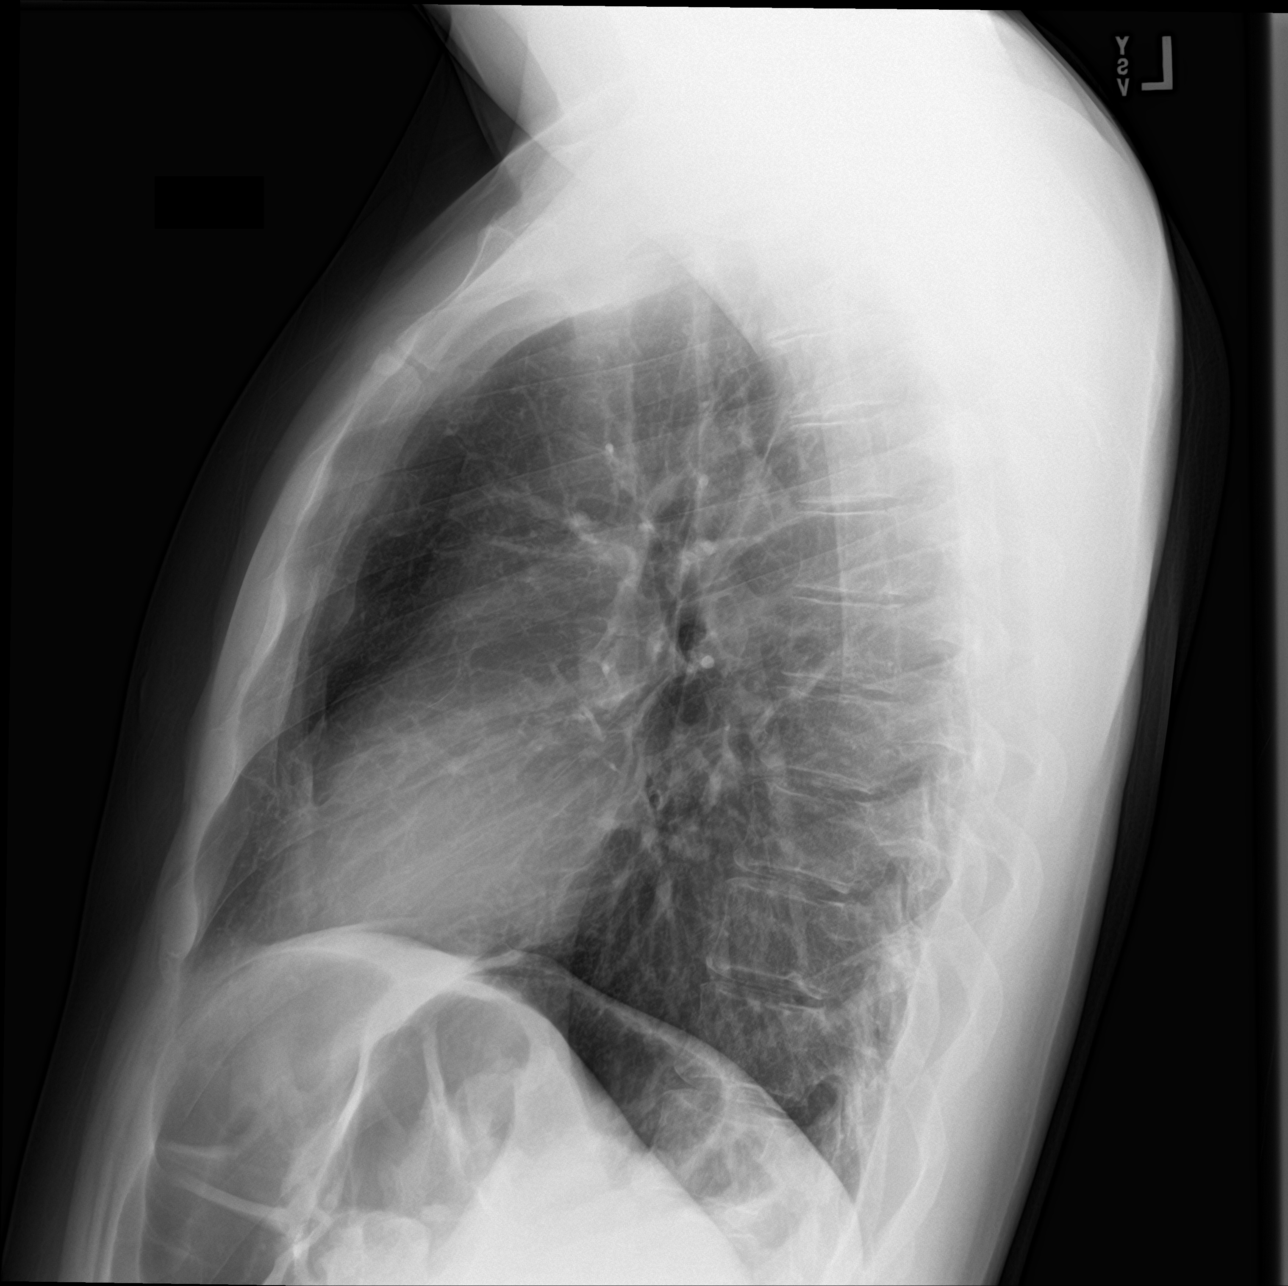

[chest lat (2 of 2)]
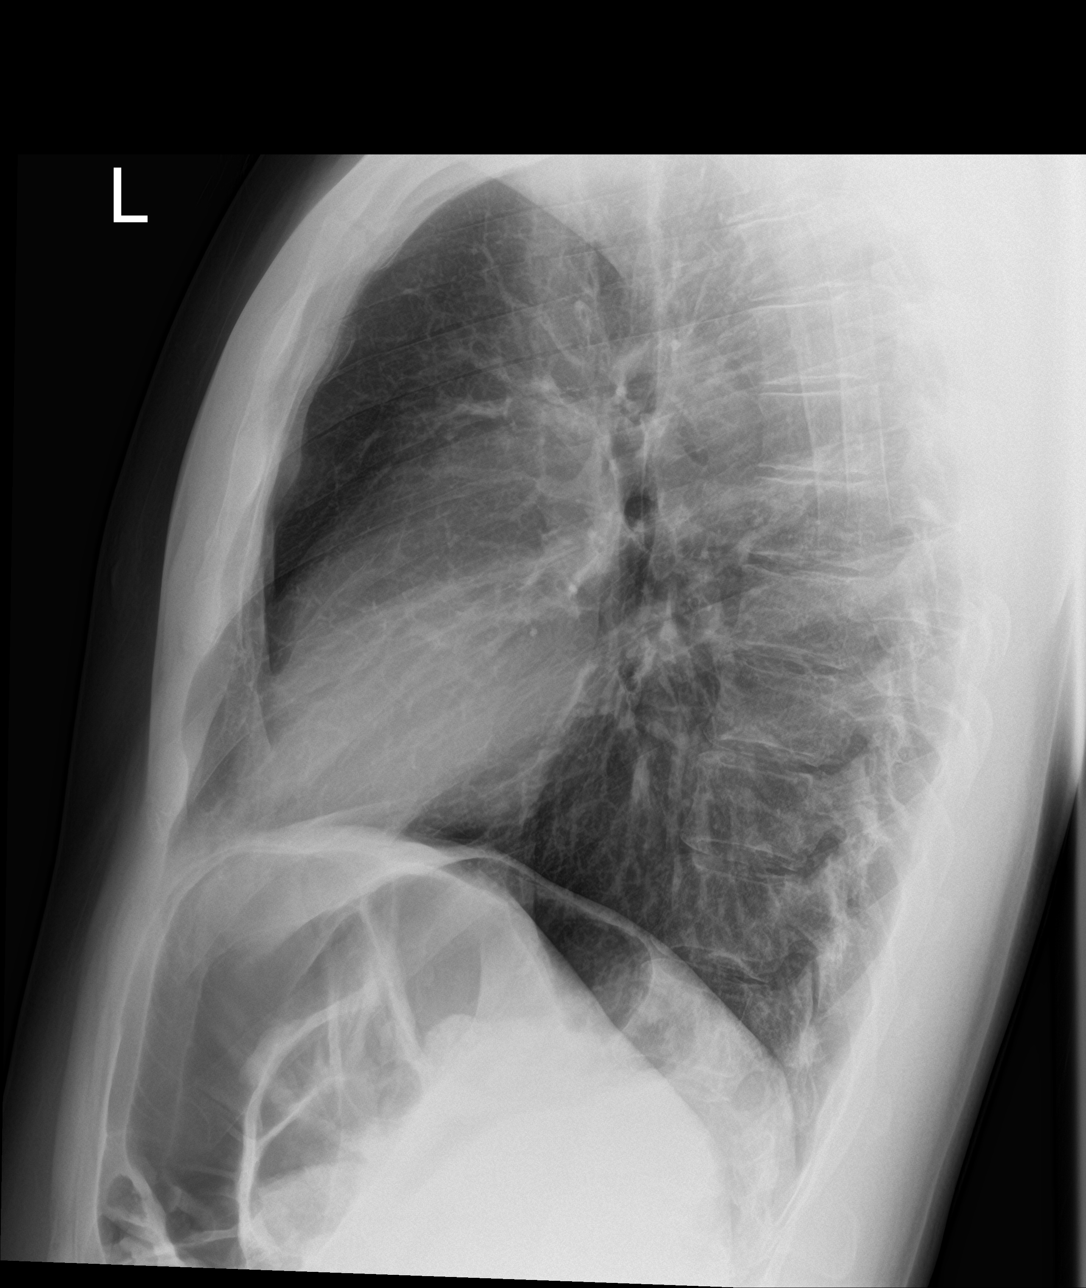

[3 of 3 positions shown; findings below may reference images not displayed]

FINDINGS: The heart size and mediastinal contours are within normal limits.
Both lungs are clear. The visualized skeletal structures are
unremarkable.
IMPRESSION: Normal exam.

## 2019-11-08 ENCOUNTER — Ambulatory Visit: Payer: Medicaid Other | Attending: Internal Medicine

## 2019-11-08 DIAGNOSIS — Z23 Encounter for immunization: Secondary | ICD-10-CM

## 2019-11-08 NOTE — Progress Notes (Signed)
   Covid-19 Vaccination Clinic  Name:  [name]    MRN: [id number] DOB: Sep 16, 1980  11/08/2019  [name] was observed post Covid-19 immunization for 15 minutes without incident. He was provided with Vaccine Information Sheet and instruction to access the V-Safe system.   [name] was instructed to call 911 with any severe reactions post vaccine: Marland Kitchen Difficulty breathing  . Swelling of face and throat  . A fast heartbeat  . A bad rash all over body  . Dizziness and weakness   Immunizations Administered    Name Date Dose VIS Date Route   Pfizer COVID-19 Vaccine 11/08/2019 10:23 AM 0.3 mL 07/27/2019 Intramuscular   Manufacturer: [employer], [employer]   Lot: [id number]   NDC: [address]-9

## 2019-12-04 ENCOUNTER — Ambulatory Visit: Payer: Medicaid Other | Attending: Internal Medicine

## 2019-12-04 DIAGNOSIS — Z23 Encounter for immunization: Secondary | ICD-10-CM

## 2019-12-04 NOTE — Progress Notes (Signed)
   Covid-19 Vaccination Clinic  Name:  [name]    MRN: [id number] DOB: 1980/11/23  12/04/2019  [name] was observed post Covid-19 immunization for 30 minutes based on pre-vaccination screening without incident. He was provided with Vaccine Information Sheet and instruction to access the V-Safe system.   [name] was instructed to call 911 with any severe reactions post vaccine: Marland Kitchen Difficulty breathing  . Swelling of face and throat  . A fast heartbeat  . A bad rash all over body  . Dizziness and weakness   Immunizations Administered    Name Date Dose VIS Date Route   Pfizer COVID-19 Vaccine 12/04/2019  2:05 PM 0.3 mL 10/10/2018 Intramuscular   Manufacturer: [employer], [employer]   Lot: [id number]   NDC: [address]-0

## 2020-11-03 ENCOUNTER — Telehealth: Payer: Self-pay | Admitting: Psychiatry

## 2020-11-03 NOTE — Telephone Encounter (Signed)
Pt called and  Left a message to  asked if he could be seen here again.He wants to know what he needs to do to be seen. He has a balance of $ 650 . He would be self pay so he wants to know how often he would have to be seen and how much each visit would cost? Do you want to see him?

## 2020-11-03 NOTE — Telephone Encounter (Signed)
We will not see him here again.  I cannot take any additional patients due to my patient load being full.  He cannot see any of our midlevel providers because his case with medical problems is too complicated for them.  But our best wishes are with him.

## 2020-11-04 NOTE — Telephone Encounter (Signed)
[name] was able to reach [place] and gave [name] this information. Not able to see [name] back here.

## 2022-09-16 ENCOUNTER — Other Ambulatory Visit: Payer: Self-pay

## 2022-09-16 ENCOUNTER — Ambulatory Visit (INDEPENDENT_AMBULATORY_CARE_PROVIDER_SITE_OTHER): Payer: Medicaid Other | Admitting: Podiatry

## 2022-09-16 DIAGNOSIS — L600 Ingrowing nail: Secondary | ICD-10-CM

## 2022-09-16 MED ORDER — DOXYCYCLINE HYCLATE 100 MG PO TABS: 100.0000 mg | ORAL_TABLET | Freq: Two times a day (BID) | ORAL | 0 refills | Status: AC

## 2022-09-16 MED ORDER — DOXYCYCLINE HYCLATE 100 MG PO TABS: 100.0000 mg | ORAL_TABLET | Freq: Two times a day (BID) | ORAL | 0 refills | Status: DC

## 2022-09-16 NOTE — Progress Notes (Signed)
Subjective:  Patient ID: [name], male    DOB: [birth year],  MRN: [id number]  Chief Complaint  Patient presents with   Nail Problem    Ingrown     42 y.o. male presents with the above complaint.  Patient presents with left hallux medial border ingrown.  Painful to touch is progressive gotten worse worse with ambulation worse with pressure place but 7 out of 10 dull achy in nature he would like to have it removed he has not seen anyone else prior to seeing me.   Review of Systems: Negative except as noted in the HPI. Denies N/V/F/Ch.  Past Medical History:  Diagnosis Date   ADHD (attention deficit hyperactivity disorder)    Anxiety    Bipolar 1 disorder (HCC)    Depression    Encephalopathy acute 11/18/2015   Excessive weight loss    IBS (irritable bowel syndrome)    Insomnia    Ulcerative colitis     Current Outpatient Medications:    doxycycline (VIBRA-TABS) 100 MG tablet, Take 1 tablet (100 mg total) by mouth 2 (two) times daily., Disp: 14 tablet, Rfl: 0   doxycycline (VIBRA-TABS) 100 MG tablet, Take 1 tablet (100 mg total) by mouth 2 (two) times daily., Disp: 20 tablet, Rfl: 0   acetaminophen (TYLENOL) 500 MG tablet, Take 1,000 mg by mouth 4 (four) times daily., Disp: , Rfl:    amphetamine-dextroamphetamine (ADDERALL) 15 MG tablet, Take 1 tablet by mouth 3 (three) times daily. (Patient not taking: Reported on 09/12/2018), Disp: 90 tablet, Rfl: 0   amphetamine-dextroamphetamine (ADDERALL) 15 MG tablet, Take 1 tablet by mouth 3 (three) times daily. (Patient not taking: Reported on 09/12/2018), Disp: 90 tablet, Rfl: 0   amphetamine-dextroamphetamine (ADDERALL) 15 MG tablet, Take 1 tablet by mouth 3 (three) times daily., Disp: 90 tablet, Rfl: 0   azaTHIOprine (IMURAN) 50 MG tablet, TAKE (3) TABLETS DAILY. (Patient not taking: Reported on 10/10/2017), Disp: 90 tablet, Rfl: 11   clonazePAM (KLONOPIN) 1 MG tablet, Take 1 tablet (1 mg total) by mouth 3 (three) times daily.  (Patient not taking: Reported on 09/12/2018), Disp: 90 tablet, Rfl: 1   EPINEPHrine (EPIPEN) 0.3 mg/0.3 mL SOAJ injection, Inject 0.3 mLs (0.3 mg total) into the muscle as needed. (Patient not taking: Reported on 06/19/2018), Disp: 1 Device, Rfl: 1   folic acid (FOLVITE) 1 MG tablet, TAKE 1 TABLET EACH DAY. (Patient not taking: Reported on 10/10/2017), Disp: 30 tablet, Rfl: 11   gabapentin (NEURONTIN) 600 MG tablet, Take 1 tablet (600 mg total) by mouth 2 (two) times daily for 7 days., Disp: 14 tablet, Rfl: 0   glycopyrrolate (ROBINUL) 2 MG tablet, Take 1 tablet (2 mg total) by mouth 2 (two) times daily. (Patient not taking: Reported on 10/10/2017), Disp: 60 tablet, Rfl: 5   lamoTRIgine (LAMICTAL) 150 MG tablet, Take 2 tablets (300 mg total) by mouth at bedtime for 7 days., Disp: 14 tablet, Rfl: 0   loperamide (IMODIUM) 2 MG capsule, Take 2 mg by mouth 5 (five) times daily. , Disp: , Rfl: 5   Multiple Vitamin (MULTIVITAMIN WITH MINERALS) TABS, Take 1 tablet by mouth daily. As a nutritional supplement., Disp: 30 tablet, Rfl: 0   predniSONE (DELTASONE) 20 MG tablet, Take 40 mg for two weeks and then decrease to 30 mg once daily until follow up visit (Patient not taking: Reported on 10/10/2017), Disp: 60 tablet, Rfl: 0   pregabalin (LYRICA) 100 MG capsule, Take 100 mg by mouth 2 (two) times daily. ,  Disp: , Rfl:    Probiotic Product (VSL#3 DS) PACK, Take 1 each by mouth 4 (four) times daily. (Patient not taking: Reported on 09/12/2018), Disp: 6 each, Rfl: 0   promethazine (PHENERGAN) 25 MG tablet, TAKE 1 TABLET EVERY 6 HOURS AS NEEDED FOR NAUSEA & VOMITING. (Patient taking differently: Take 25 mg by mouth every 8 (eight) hours as needed for nausea. ), Disp: 30 tablet, Rfl: 1   ziprasidone (GEODON) 40 MG capsule, Take 1 capsule (40 mg total) by mouth 2 (two) times daily with a meal for 7 days. 1 in AM and 2 in PM, Disp: 14 capsule, Rfl: 0  Social History   Tobacco Use  Smoking Status Never  Smokeless Tobacco  Never    Allergies  Allergen Reactions   Cephalexin     REACTION: urticaria (hives)   Objective:  There were no vitals filed for this visit. There is no height or weight on file to calculate BMI. Constitutional Well developed. Well nourished.  Vascular Dorsalis pedis pulses palpable bilaterally. Posterior tibial pulses palpable bilaterally. Capillary refill normal to all digits.  No cyanosis or clubbing noted. Pedal hair growth normal.  Neurologic Normal speech. Oriented to person, place, and time. Epicritic sensation to light touch grossly present bilaterally.  Dermatologic Painful ingrowing nail at medial nail borders of the hallux nail left. No other open wounds. No skin lesions.  Orthopedic: Normal joint ROM without pain or crepitus bilaterally. No visible deformities. No bony tenderness.   Radiographs: None Assessment:   1. Ingrown left big toenail    Plan:  Patient was evaluated and treated and all questions answered.  Ingrown Nail, left -Patient elects to proceed with minor surgery to remove ingrown toenail removal today. Consent reviewed and signed by patient. -Ingrown nail excised. See procedure note. -Educated on post-procedure care including soaking. Written instructions provided and reviewed. -Patient to follow up in 2 weeks for nail check.  Procedure: Excision of Ingrown Toenail Location: Left 1st toe medial nail borders. Anesthesia: Lidocaine 1% plain; 1.5 mL and Marcaine 0.5% plain; 1.5 mL, digital block. Skin Prep: Betadine. Dressing: Silvadene; telfa; dry, sterile, compression dressing. Technique: Following skin prep, the toe was exsanguinated and a tourniquet was secured at the base of the toe. The affected nail border was freed, split with a nail splitter, and excised. Chemical matrixectomy was then performed with phenol and irrigated out with alcohol. The tourniquet was then removed and sterile dressing applied. Disposition: Patient tolerated  procedure well. Patient to return in 2 weeks for follow-up.   No follow-ups on file.

## 2023-06-27 ENCOUNTER — Encounter: Payer: Self-pay | Admitting: Podiatry

## 2023-06-27 ENCOUNTER — Ambulatory Visit (INDEPENDENT_AMBULATORY_CARE_PROVIDER_SITE_OTHER): Payer: MEDICAID | Admitting: Podiatry

## 2023-06-27 DIAGNOSIS — L600 Ingrowing nail: Secondary | ICD-10-CM

## 2023-06-27 NOTE — Progress Notes (Unsigned)
       Subjective:  Patient ID: [name], male    DOB: 05-17-1981,  MRN: [id number]  [name] presents to clinic today for:  Chief Complaint  Patient presents with   Ingrown Toenail    Left ingrown on big toe patient states having pain had in done previously pain started  X 1 month.   Patient notes a painful ingrown toenail to the left great toe, medial nail border.  He had this border removed permanently in the past.  He did have an injury recently and is unsure if that pushed the skin into the nail to cause a new ingrown nail or if there had been some regrowth of the previous nail.  Notes consistent pain and swelling in the area along with some redness along the nail margin.  PCP is Sunny Schlein, MD.  Allergies  Allergen Reactions   Cephalexin     REACTION: urticaria (hives)    Review of Systems: Negative except as noted in the HPI.  Objective:  [name] is a pleasant 42 y.o. male in NAD. AAO x 3.  Vascular Examination: Capillary refill time is 3-5 seconds to toes bilateral. Palpable pedal pulses b/l LE. Digital hair present b/l. No pedal edema b/l. Skin temperature gradient WNL b/l. No varicosities b/l. No cyanosis or clubbing noted b/l.   Dermatological Examination: There is incurvation of the left hallux medial nail border.  There is pain on palpation of the affected nail border.  There is minimal localized erythema and edema noted.  No active drainage is seen  Neurological Examination: Epicritic sensation is intact  Assessment/Plan: 1. Ingrown left big toenail     Discussed patient's condition today.  Due to the clinical findings I feel it is necessary to repeat the ingrown toenail procedure today for the patient to obtain relief.  After obtaining patient consent, the left hallux was anesthetized with a 50:50 mixture of 1% lidocaine plain and 0.5% bupivacaine plain for a total of 3cc's administered.  Upon confirmation of  anesthesia, a freer elevator was utilized to free the left hallux medial nail border from the nail bed.  The nail border was then avulsed proximal to the eponychium and removed in toto.  The area was inspected for any remaining spicules.  A chemical matrixectomy was performed with NaOH and neutralized with acetic acid solution.  Antibiotic ointment and a DSD were applied, followed by a Coban dressing.  Patient tolerated the anesthetic and procedure well and will f/u in 2-3 weeks for recheck.  Patient given post-procedure instructions for daily 15-minute Epsom salt soaks, antibiotic ointment and daily use of Bandaids until toe starts to dry / form eschar.    Return in about 2 weeks (around 07/11/2023) for PNA recheck.   Clerance Lav, DPM, FACFAS [facility]     [address] [place], [place] [address]                Office [phone]  Fax [phone]

## 2023-06-27 NOTE — Patient Instructions (Signed)

## 2023-07-06 ENCOUNTER — Other Ambulatory Visit: Payer: Self-pay | Admitting: Podiatry

## 2023-07-06 ENCOUNTER — Encounter: Payer: Self-pay | Admitting: Podiatry

## 2023-07-06 MED ORDER — AZITHROMYCIN 250 MG PO TABS: ORAL_TABLET | ORAL | 0 refills | Status: AC

## 2023-07-18 ENCOUNTER — Ambulatory Visit: Payer: MEDICAID | Admitting: Podiatry

## 2023-08-16 ENCOUNTER — Ambulatory Visit: Payer: MEDICAID | Admitting: Podiatry

## 2024-03-24 ENCOUNTER — Emergency Department (HOSPITAL_COMMUNITY)
Admission: EM | Admit: 2024-03-24 | Discharge: 2024-03-24 | Disposition: A | Discharge status: Home / Self Care | Payer: MEDICAID | Attending: Emergency Medicine | Admitting: Emergency Medicine

## 2024-03-24 ENCOUNTER — Other Ambulatory Visit: Payer: Self-pay

## 2024-03-24 ENCOUNTER — Emergency Department (HOSPITAL_COMMUNITY): Payer: MEDICAID

## 2024-03-24 ENCOUNTER — Encounter (HOSPITAL_COMMUNITY): Payer: Self-pay | Admitting: Emergency Medicine

## 2024-03-24 DIAGNOSIS — R7401 Elevation of levels of liver transaminase levels: Secondary | ICD-10-CM | POA: Diagnosis not present

## 2024-03-24 DIAGNOSIS — R1031 Right lower quadrant pain: Secondary | ICD-10-CM | POA: Insufficient documentation

## 2024-03-24 DIAGNOSIS — R7989 Other specified abnormal findings of blood chemistry: Secondary | ICD-10-CM

## 2024-03-24 LAB — COMPREHENSIVE METABOLIC PANEL WITH GFR
ALT: 80 U/L — ABNORMAL HIGH (ref 0–44)
AST: 89 U/L — ABNORMAL HIGH (ref 15–41)
Albumin: 4.4 g/dL (ref 3.5–5.0)
Alkaline Phosphatase: 67 U/L (ref 38–126)
Anion gap: 13 (ref 5–15)
BUN: 25 mg/dL — ABNORMAL HIGH (ref 6–20)
CO2: 22 mmol/L (ref 22–32)
Calcium: 9.6 mg/dL (ref 8.9–10.3)
Chloride: 100 mmol/L (ref 98–111)
Creatinine, Ser: 1.36 mg/dL — ABNORMAL HIGH (ref 0.61–1.24)
GFR, Estimated: >60 mL/min (ref >60)
Glucose, Bld: 120 mg/dL — ABNORMAL HIGH (ref 70–99)
Potassium: 3.3 mmol/L — ABNORMAL LOW (ref 3.5–5.1)
Sodium: 135 mmol/L (ref 135–145)
Total Bilirubin: 0.9 mg/dL (ref 0.0–1.2)
Total Protein: 7.3 g/dL (ref 6.5–8.1)

## 2024-03-24 LAB — CBC
HCT: 46.1 % (ref 39.0–52.0)
Hemoglobin: 15.2 g/dL (ref 13.0–17.0)
MCH: 29.2 pg (ref 26.0–34.0)
MCHC: 33 g/dL (ref 30.0–36.0)
MCV: 88.5 fL (ref 80.0–100.0)
Platelets: 324 K/uL (ref 150–400)
RBC: 5.21 MIL/uL (ref 4.22–5.81)
RDW: 13.2 % (ref 11.5–15.5)
WBC: 7.8 K/uL (ref 4.0–10.5)
nRBC: 0 % (ref 0.0–0.2)

## 2024-03-24 LAB — LIPASE, BLOOD: Lipase: 34 U/L (ref 11–51)

## 2024-03-24 LAB — SEDIMENTATION RATE: Sed Rate: 9 mm/h (ref 0–16)

## 2024-03-24 MED ORDER — IOHEXOL 300 MG/ML  SOLN
100.0000 mL | Freq: Once | INTRAMUSCULAR | Status: AC | PRN
  Administered 2024-03-24: 100 mL via INTRAVENOUS

## 2024-03-24 MED ORDER — DIAZEPAM 5 MG/ML IJ SOLN
5.0000 mg | Freq: Once | INTRAMUSCULAR | Status: AC
  Administered 2024-03-24: 5 mg via INTRAVENOUS
  Filled 2024-03-24: qty 2

## 2024-03-24 MED ORDER — LACTATED RINGERS IV BOLUS
1000.0000 mL | Freq: Once | INTRAVENOUS | Status: AC
  Administered 2024-03-24: 1000 mL via INTRAVENOUS

## 2024-03-24 NOTE — ED Provider Notes (Signed)
 [place] EMERGENCY DEPARTMENT AT [facility] Provider Note   CSN: [id number] Arrival date & time: 03/24/24  1813     Patient presents with: Abdominal Pain   [name] is a 43 y.o. male.   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  Patient reports since Wednesday has been having right mid abdomen and right lower quadrant abdominal pain.  Describes it as a throbbing sensation.  Currently 4/10 in severity.  Not worsened with movement.  No nausea vomiting or diarrhea.  No fevers.  No testicular pain or swelling.  No dysuria or frequency.  Called his primary doctor and told him to come to the emergency department due to concerns for possible appendicitis.        Prior to Admission medications   Medication Sig Start Date End Date Taking? Authorizing Provider  acetaminophen  (TYLENOL ) 500 MG tablet Take 1,000 mg by mouth 4 (four) times daily.    [provider]  amphetamine -dextroamphetamine  (ADDERALL) 15 MG tablet Take 1 tablet by mouth 3 (three) times daily. 06/19/18   Cottle, Lorene KANDICE Raddle., MD  amphetamine -dextroamphetamine  (ADDERALL) 15 MG tablet Take 1 tablet by mouth 3 (three) times daily. 07/17/18   Cottle, Lorene KANDICE Raddle., MD  amphetamine -dextroamphetamine  (ADDERALL) 15 MG tablet Take 1 tablet by mouth 3 (three) times daily. 08/14/18   Cottle, Lorene KANDICE Raddle., MD  azaTHIOprine  (IMURAN ) 50 MG tablet TAKE (3) TABLETS DAILY. 04/02/14   Debrah Lamar BIRCH, MD  clonazePAM  (KLONOPIN ) 1 MG tablet Take 1 tablet (1 mg total) by mouth 3 (three) times daily. 08/14/18   Cottle, Lorene KANDICE Raddle., MD  doxycycline  (VIBRA -TABS) 100 MG tablet Take 1 tablet (100 mg total) by mouth 2 (two) times daily. 09/16/22   Tobie Franky SQUIBB, DPM  doxycycline  (VIBRA -TABS) 100 MG tablet Take 1 tablet (100 mg total) by mouth 2 (two) times daily. 09/16/22   Tobie Franky SQUIBB, DPM  EPINEPHrine   (EPIPEN ) 0.3 mg/0.3 mL SOAJ injection Inject 0.3 mLs (0.3 mg total) into the muscle as needed. 09/16/13   Sofia, Leslie K, PA-C  folic acid  (FOLVITE ) 1 MG tablet TAKE 1 TABLET EACH DAY. 04/02/14   Debrah Lamar BIRCH, MD  gabapentin  (NEURONTIN ) 600 MG tablet Take 1 tablet (600 mg total) by mouth 2 (two) times daily for 7 days. 09/15/18 09/22/18  Soto, Johana, PA-C  glycopyrrolate  (ROBINUL ) 2 MG tablet Take 1 tablet (2 mg total) by mouth 2 (two) times daily. 03/20/14   Avram Lupita BRAVO, MD  lamoTRIgine  (LAMICTAL ) 150 MG tablet Take 2 tablets (300 mg total) by mouth at bedtime for 7 days. 09/15/18 09/22/18  Soto, Johana, PA-C  loperamide  (IMODIUM ) 2 MG capsule Take 2 mg by mouth 5 (five) times daily.  04/27/18   [provider]  Multiple Vitamin (MULTIVITAMIN WITH MINERALS) TABS Take 1 tablet by mouth daily. As a nutritional supplement. 05/15/12   Readling, Darina BIRCH, MD  predniSONE  (DELTASONE ) 20 MG tablet Take 40 mg for two weeks and then decrease to 30 mg once daily until follow up visit 02/26/14   Esterwood, [name] S, PA-C  pregabalin  (LYRICA ) 100 MG capsule Take 100 mg by mouth 2 (two) times daily.  01/26/18 01/26/19  [provider]  Probiotic Product (VSL#3 DS) PACK Take 1 each by mouth 4 (four) times daily. 03/15/14   Debrah Lamar BIRCH, MD  promethazine  (PHENERGAN ) 25 MG tablet TAKE 1 TABLET EVERY 6 HOURS  AS NEEDED FOR NAUSEA & VOMITING. Patient taking differently: Take 25 mg by mouth every 8 (eight) hours as needed for nausea.    Debrah Lamar BIRCH, MD  ziprasidone  (GEODON ) 40 MG capsule Take 1 capsule (40 mg total) by mouth 2 (two) times daily with a meal for 7 days. 1 in AM and 2 in PM 09/15/18 09/22/18  Soto, Johana, PA-C    Allergies: Cephalexin    Review of Systems  Updated Vital Signs BP 116/84   Pulse 80   Temp 98.2 F (36.8 C)   Resp 14   SpO2 99%   Physical Exam Vitals and nursing note reviewed.  Constitutional:      General: He is not in acute distress.    Appearance: He is  well-developed.  HENT:     Head: Normocephalic and atraumatic.     Right Ear: External ear normal.     Left Ear: External ear normal.     Nose: Nose normal.  Eyes:     Extraocular Movements: Extraocular movements intact.     Conjunctiva/sclera: Conjunctivae normal.     Pupils: Pupils are equal, round, and reactive to light.  Abdominal:     General: There is no distension.     Palpations: There is no mass.     Tenderness: There is abdominal tenderness (Right mid abdomen and right lower quadrant). There is no guarding.  Musculoskeletal:     Cervical back: Normal range of motion and neck supple.  Neurological:     Mental Status: He is alert.  Psychiatric:        Mood and Affect: Mood normal.        Behavior: Behavior normal.     (all labs ordered are listed, but only abnormal results are displayed) Labs Reviewed  COMPREHENSIVE METABOLIC PANEL WITH GFR - Abnormal; Notable for the following components:      Result Value   Potassium 3.3 (*)    Glucose, Bld 120 (*)    BUN 25 (*)    Creatinine, Ser 1.36 (*)    AST 89 (*)    ALT 80 (*)    All other components within normal limits  LIPASE, BLOOD  CBC  SEDIMENTATION RATE  URINALYSIS, ROUTINE W REFLEX MICROSCOPIC  C-REACTIVE PROTEIN    EKG: None  Radiology: CT ABDOMEN PELVIS W CONTRAST Result Date: 03/24/2024 CLINICAL DATA:  Right lower quadrant pain EXAM: CT ABDOMEN AND PELVIS WITH CONTRAST TECHNIQUE: Multidetector CT imaging of the abdomen and pelvis was performed using the standard protocol following bolus administration of intravenous contrast. RADIATION DOSE REDUCTION: This exam was performed according to the departmental dose-optimization program which includes automated exposure control, adjustment of the mA and/or kV according to patient size and/or use of iterative reconstruction technique. CONTRAST:  OMNIPAQUE  IOHEXOL  300 MG/ML  SOLN COMPARISON:  11/18/2015 FINDINGS: Lower chest: No acute abnormality. Hepatobiliary:  No focal liver abnormality is seen. No gallstones, gallbladder wall thickening, or biliary dilatation. Pancreas: Unremarkable. No pancreatic ductal dilatation or surrounding inflammatory changes. Spleen: Normal in size without focal abnormality. Adrenals/Urinary Tract: Adrenal glands are within normal limits. Kidneys demonstrate a normal enhancement pattern bilaterally. No renal calculi or obstructive changes are seen. The bladder is well distended. Stomach/Bowel: Postsurgical changes are noted in the rectosigmoid region. Predominately left colectomy has been performed. Reanastomosis of the rectosigmoid and distal small bowel is noted stable from the prior exam. Stomach is decompressed. No small bowel abnormality is noted. Vascular/Lymphatic: No significant vascular findings are present. No enlarged  abdominal or pelvic lymph nodes. Reproductive: Prostate is unremarkable. Other: No abdominal wall hernia or abnormality. No abdominopelvic ascites. Musculoskeletal: No acute or significant osseous findings. IMPRESSION: Stable postsurgical changes similar to that seen on the prior exam. No other focal abnormality is seen. Electronically Signed   By: Oneil Devonshire M.D.   On: 03/24/2024 20:09     Procedures   Medications Ordered in the ED  diazepam  (VALIUM ) injection 5 mg (5 mg Intravenous Given 03/24/24 1839)  lactated ringers  bolus 1,000 mL (0 mLs Intravenous Stopped 03/24/24 2210)  iohexol  (OMNIPAQUE ) 300 MG/ML solution 100 mL (100 mLs Intravenous Contrast Given 03/24/24 1947)                                    Medical Decision Making Amount and/or Complexity of Data Reviewed Labs: ordered. Radiology: ordered.  Risk Prescription drug management.   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   Initial Ddx:  Appendicitis, inflammatory bowel disease, muscle strain  MDM/Course:   Patient presents emergency department with right lower quadrant abdominal pain.  Does have a history of a total colectomy.  He is unsure if he still has his appendix or not.  No other GI symptoms.  Denies any testicular pain or swelling or urinary symptoms.  On exam does have right lower quadrant tenderness to palpation.  Labs showed slight worsening of his renal function so was given IV fluids.  Also showed marginally elevated LFTs which she was told about.  Says he has had this happen in the past.  Counseled to stop drinking alcohol and follow-up with his primary doctor about this.  His CT scan does not show any acute findings. May have a muscle strain causing his symptoms.   This patient presents to the ED for concern of complaints listed in HPI, this involves an extensive number of treatment options, and is a complaint that carries with it a high risk of complications and morbidity. Disposition including potential need for admission considered.   Dispo: DC Home. Return precautions discussed including, but not limited to, those listed in the AVS. Allowed pt time to ask questions which were answered fully prior to dc.  Additional history obtained from father Records reviewed Outpatient Clinic Notes The following labs were independently interpreted: Chemistry and show AKI I independently reviewed the following imaging with scope of interpretation limited to determining acute life threatening conditions related to emergency care: CT Abdomen/Pelvis and agree with the radiologist interpretation with the following exceptions: none I have reviewed the patients home medications and made adjustments as needed  Portions of this note were generated with Dragon dictation software. Dictation errors may occur despite best attempts at proofreading.     Final diagnoses:  Right lower quadrant abdominal pain  Elevated LFTs    ED Discharge Orders     None          Yolande Lamar BROCKS, MD 03/24/24  [phone]

## 2024-03-24 NOTE — Discharge Instructions (Signed)
 You were seen for your abdominal pain in the emergency department.   At home, please take ibuprofen for your pain. Hold off on tylenol  and drinking alcohol because of your liver function tests.     Check your MyChart online for the results of any tests that had not resulted by the time you left the emergency department.   Follow-up with your primary doctor in 2-3 days regarding your visit.  Talk to them about your AST and ALT (liver function tests) and having them repeated.   Return immediately to the emergency department if you experience any of the following: worsening pain, or any other concerning symptoms.    Thank you for visiting our Emergency Department. It was a pleasure taking care of you today.

## 2024-03-24 NOTE — ED Triage Notes (Signed)
 Pt reports right lower quadrant pain x 3 days. No fevers.  No n/v/d.  Called his physician who suggested rule out of appendicitis.  Pt does report he has a J pouch.

## 2024-03-25 LAB — C-REACTIVE PROTEIN: CRP: <0.5 mg/dL (ref <1.0)

## 2024-04-21 ENCOUNTER — Emergency Department (HOSPITAL_COMMUNITY)
Admission: EM | Admit: 2024-04-21 | Discharge: 2024-04-21 | Disposition: A | Discharge status: Home / Self Care | Payer: MEDICAID | Attending: Emergency Medicine | Admitting: Emergency Medicine

## 2024-04-21 ENCOUNTER — Emergency Department (HOSPITAL_COMMUNITY): Payer: MEDICAID

## 2024-04-21 DIAGNOSIS — R0789 Other chest pain: Secondary | ICD-10-CM | POA: Diagnosis present

## 2024-04-21 LAB — BASIC METABOLIC PANEL WITH GFR
Anion gap: 12 (ref 5–15)
BUN: 15 mg/dL (ref 6–20)
CO2: 21 mmol/L — ABNORMAL LOW (ref 22–32)
Calcium: 9.1 mg/dL (ref 8.9–10.3)
Chloride: 105 mmol/L (ref 98–111)
Creatinine, Ser: 1 mg/dL (ref 0.61–1.24)
GFR, Estimated: >60 mL/min (ref >60)
Glucose, Bld: 152 mg/dL — ABNORMAL HIGH (ref 70–99)
Potassium: 4 mmol/L (ref 3.5–5.1)
Sodium: 138 mmol/L (ref 135–145)

## 2024-04-21 LAB — CBC
HCT: 43.8 % (ref 39.0–52.0)
Hemoglobin: 14.2 g/dL (ref 13.0–17.0)
MCH: 29.4 pg (ref 26.0–34.0)
MCHC: 32.4 g/dL (ref 30.0–36.0)
MCV: 90.7 fL (ref 80.0–100.0)
Platelets: 394 K/uL (ref 150–400)
RBC: 4.83 MIL/uL (ref 4.22–5.81)
RDW: 13.8 % (ref 11.5–15.5)
WBC: 5.3 K/uL (ref 4.0–10.5)
nRBC: 0 % (ref 0.0–0.2)

## 2024-04-21 LAB — TROPONIN T, HIGH SENSITIVITY: Troponin T High Sensitivity: <15 ng/L (ref 0–19)

## 2024-04-21 MED ORDER — HYDROXYZINE HCL 25 MG PO TABS
25.0000 mg | ORAL_TABLET | Freq: Once | ORAL | Status: AC
  Administered 2024-04-21: 25 mg via ORAL
  Filled 2024-04-21: qty 1

## 2024-04-21 MED ORDER — KETOROLAC TROMETHAMINE 15 MG/ML IJ SOLN
15.0000 mg | Freq: Once | INTRAMUSCULAR | Status: AC
  Administered 2024-04-21: 15 mg via INTRAVENOUS
  Filled 2024-04-21: qty 1

## 2024-04-21 NOTE — Discharge Instructions (Signed)
 Your workup today was reassuring.  No concerning cause of chest pain.  Likely musculoskeletal given the intense dance class he had on Thursday.  Follow-up with your primary care doctor.  In the meantime take ibuprofen 600 mg 3 times a day for the next 5-7 days.  Return for any concerning symptoms such as worsening chest pain, shortness of breath.

## 2024-04-21 NOTE — ED Triage Notes (Signed)
 Patient in today reporting left sided chest pain that started Thursday. Denies n/v/d.  Nonradiating.

## 2024-04-21 NOTE — ED Provider Notes (Signed)
 [place] EMERGENCY DEPARTMENT AT [facility] Provider Note   CSN: [id number] Arrival date & time: 04/21/24  0940     Patient presents with: Chest Pain   [name] is a 43 y.o. male.   43 year old male presents today for concern of left-sided chest pain that started Thursday.  He states this started during dance class and continued since then.  He is quite active and in addition to dance he goes to the gym and does yoga.  It is worse with movement.  Does not radiate.  No prior history of cardiac disease.  No prior history of DVT or PE.  Denies recent long travel, recent surgery, no leg pain or leg swelling.  He denies shortness of breath.  The history is provided by the patient. No language interpreter was used.       Prior to Admission medications   Medication Sig Start Date End Date Taking? Authorizing Provider  acetaminophen  (TYLENOL ) 500 MG tablet Take 1,000 mg by mouth 4 (four) times daily.    [provider]  amphetamine -dextroamphetamine  (ADDERALL) 15 MG tablet Take 1 tablet by mouth 3 (three) times daily. 06/19/18   Cottle, Lorene KANDICE Raddle., MD  amphetamine -dextroamphetamine  (ADDERALL) 15 MG tablet Take 1 tablet by mouth 3 (three) times daily. 07/17/18   Cottle, Lorene KANDICE Raddle., MD  amphetamine -dextroamphetamine  (ADDERALL) 15 MG tablet Take 1 tablet by mouth 3 (three) times daily. 08/14/18   Cottle, Lorene KANDICE Raddle., MD  azaTHIOprine  (IMURAN ) 50 MG tablet TAKE (3) TABLETS DAILY. 04/02/14   Debrah Lamar BIRCH, MD  clonazePAM  (KLONOPIN ) 1 MG tablet Take 1 tablet (1 mg total) by mouth 3 (three) times daily. 08/14/18   Cottle, Lorene KANDICE Raddle., MD  doxycycline  (VIBRA -TABS) 100 MG tablet Take 1 tablet (100 mg total) by mouth 2 (two) times daily. 09/16/22   Tobie Franky SQUIBB, DPM  doxycycline  (VIBRA -TABS) 100 MG tablet Take 1 tablet (100 mg total) by mouth 2 (two) times daily. 09/16/22   Tobie Franky SQUIBB, DPM  EPINEPHrine  (EPIPEN ) 0.3 mg/0.3 mL SOAJ injection Inject 0.3 mLs (0.3 mg  total) into the muscle as needed. 09/16/13   Sofia, Leslie K, PA-C  folic acid  (FOLVITE ) 1 MG tablet TAKE 1 TABLET EACH DAY. 04/02/14   Debrah Lamar BIRCH, MD  gabapentin  (NEURONTIN ) 600 MG tablet Take 1 tablet (600 mg total) by mouth 2 (two) times daily for 7 days. 09/15/18 09/22/18  Soto, Johana, PA-C  glycopyrrolate  (ROBINUL ) 2 MG tablet Take 1 tablet (2 mg total) by mouth 2 (two) times daily. 03/20/14   Avram Lupita BRAVO, MD  lamoTRIgine  (LAMICTAL ) 150 MG tablet Take 2 tablets (300 mg total) by mouth at bedtime for 7 days. 09/15/18 09/22/18  Soto, Johana, PA-C  loperamide  (IMODIUM ) 2 MG capsule Take 2 mg by mouth 5 (five) times daily.  04/27/18   [provider]  Multiple Vitamin (MULTIVITAMIN WITH MINERALS) TABS Take 1 tablet by mouth daily. As a nutritional supplement. 05/15/12   Readling, Darina BIRCH, MD  predniSONE  (DELTASONE ) 20 MG tablet Take 40 mg for two weeks and then decrease to 30 mg once daily until follow up visit 02/26/14   Esterwood, [name] S, PA-C  pregabalin  (LYRICA ) 100 MG capsule Take 100 mg by mouth 2 (two) times daily.  01/26/18 01/26/19  [provider]  Probiotic Product (VSL#3 DS) PACK Take 1 each by mouth 4 (four) times daily. 03/15/14   Debrah Lamar BIRCH, MD  promethazine  (PHENERGAN ) 25 MG tablet TAKE 1 TABLET EVERY  6 HOURS AS NEEDED FOR NAUSEA & VOMITING. Patient taking differently: Take 25 mg by mouth every 8 (eight) hours as needed for nausea.    Debrah Lamar BIRCH, MD  ziprasidone  (GEODON ) 40 MG capsule Take 1 capsule (40 mg total) by mouth 2 (two) times daily with a meal for 7 days. 1 in AM and 2 in PM 09/15/18 09/22/18  Soto, Johana, PA-C    Allergies: Cephalexin    Review of Systems  All other systems reviewed and are negative.   Updated Vital Signs BP 115/85 (BP Location: Left Arm)   Pulse (!) 101   Temp 98.1 F (36.7 C) (Oral)   Resp 20   SpO2 96%   Physical Exam Vitals and nursing note reviewed.  Constitutional:      General: He is not in acute distress.     Appearance: Normal appearance. He is not ill-appearing.  HENT:     Head: Normocephalic and atraumatic.     Nose: Nose normal.  Eyes:     Conjunctiva/sclera: Conjunctivae normal.  Cardiovascular:     Rate and Rhythm: Regular rhythm.     Comments: Initially tachycardic but resolved on my evaluation.  He is normal sinus rhythm with rates of 80s. Pulmonary:     Effort: Pulmonary effort is normal. No respiratory distress.  Musculoskeletal:        General: No deformity.  Skin:    Findings: No rash.  Neurological:     Mental Status: He is alert.     (all labs ordered are listed, but only abnormal results are displayed) Labs Reviewed  CBC  BASIC METABOLIC PANEL WITH GFR  TROPONIN T, HIGH SENSITIVITY    EKG: None  Radiology: No results found.   Procedures   Medications Ordered in the ED - No data to display  Clinical Course as of 04/21/24 1132  Sat Apr 21, 2024  1035 Patient requesting anxiety medication.  Will give hydroxyzine .  He takes this at home as well. [AA]    Clinical Course User Index [AA] Hildegard Loge, PA-C                                 Medical Decision Making Amount and/or Complexity of Data Reviewed Labs: ordered. Radiology: ordered.  Risk Prescription drug management.   Medical Decision Making / ED Course   This patient presents to the ED for concern of chest pain, this involves an extensive number of treatment options, and is a complaint that carries with it a high risk of complications and morbidity.  The differential diagnosis includes ACS, PE, MSK etiology, GERD  MDM: 43 year old previous cardiac disease presents today with left-sided chest pain after dance class Thursday.  This was his first dance class.  He states it was a hybrid.  He states it was intense.  He states he was lifting weights during dance class and that is when it started.  He also went to the gym and did some yoga during the subsequent days.  Low heart score pain not radiating.   Atypical in nature.  Low risk for PE on Wells criteria.  Initially noted to be tachycardic but this has resolved since being brought to a room and resting.  Somewhat anxious on exam.  Will reevaluate after labs resulted.  Chest x-ray pending.  EKG without acute ischemic change.  Low suspicion for ACS.  No risk for PE on Wells criteria. Chest x-ray without acute  cardiopulmonary process.  Toradol  given.  Likely MSK in nature.  Patient is stable for discharge.  Supportive care discussed.  Discussed close follow-up with PCP.  Strict return precautions given.    Lab Tests: -I ordered, reviewed, and interpreted labs.   The pertinent results include:   Labs Reviewed  CBC  BASIC METABOLIC PANEL WITH GFR  TROPONIN T, HIGH SENSITIVITY      EKG  EKG Interpretation Date/Time:    Ventricular Rate:    PR Interval:    QRS Duration:    QT Interval:    QTC Calculation:   R Axis:      Text Interpretation:           Imaging Studies ordered: I ordered imaging studies including chest x-ray I independently visualized and interpreted imaging. I agree with the radiologist interpretation   Medicines ordered and prescription drug management: No orders of the defined types were placed in this encounter.   -I have reviewed the patients home medicines and have made adjustments as needed  Reevaluation: After the interventions noted above, I reevaluated the patient and found that they have :stayed the same Reproducible musculoskeletal chest pain that resolves at rest.  Co morbidities that complicate the patient evaluation  Past Medical History:  Diagnosis Date   ADHD (attention deficit hyperactivity disorder)    Anxiety    Bipolar 1 disorder (HCC)    Depression    Encephalopathy acute 11/18/2015   Excessive weight loss    IBS (irritable bowel syndrome)    Insomnia    Ulcerative colitis       Dispostion: Discharged in stable condition.  Return precaution discussed.  Patient voices  understanding and is in agreement with plan.  Final diagnoses:  Chest wall pain    ED Discharge Orders     None          Hildegard Loge, [place] 04/21/24 1151    Tegeler, Lonni PARAS, MD 04/21/24 [phone]

## 2024-06-12 ENCOUNTER — Ambulatory Visit (INDEPENDENT_AMBULATORY_CARE_PROVIDER_SITE_OTHER): Payer: MEDICAID | Admitting: Podiatry

## 2024-06-12 ENCOUNTER — Ambulatory Visit (INDEPENDENT_AMBULATORY_CARE_PROVIDER_SITE_OTHER): Payer: MEDICAID

## 2024-06-12 DIAGNOSIS — S91312A Laceration without foreign body, left foot, initial encounter: Secondary | ICD-10-CM

## 2024-06-12 MED ORDER — MUPIROCIN 2 % EX OINT: 1.0000 | TOPICAL_OINTMENT | Freq: Two times a day (BID) | CUTANEOUS | 2 refills | Status: DC

## 2024-06-12 NOTE — Patient Instructions (Signed)
 Monitor for any signs/symptoms of infection. Call the office immediately if any occur or go directly to the emergency room. Call with any questions/concerns.

## 2024-06-13 NOTE — Progress Notes (Signed)
  Subjective:  Patient ID: [name], male    DOB: 11/14/1980,  MRN: [id number]  Chief Complaint  Patient presents with   Foot Injury    Laceration of left heel lateral side from broken glass shower door    Discussed the use of AI scribe software for clinical note transcription with the patient, who gave verbal consent to proceed.  History of Present Illness [name] is a 43 year old male who presents with a laceration on his left heel after a shower door accident.  Two to three days ago, a shower door shattered, causing a laceration on his left heel. Pain has improved, but there is concern about possible glass embedded in the wound. No treatment has been received, and there has been no bleeding. Tetanus vaccination status is uncertain.       Objective:  There were no vitals filed for this visit.  Physical Exam General: AAO x3, NAD  Dermatological: As pictured below there are superficial lacerations noted to the lateral aspect the left heel.  There is no surrounding erythema, ascending cellulitis.  No drainage or pus.  No fluctuation or crepitation. There is no malodor.  Vascular: Dorsalis Pedis artery and Posterior Tibial artery pedal pulses are 2/4 bilateral with immedate capillary fill time. There is no pain with calf compression, swelling, warmth, erythema.   Neruologic: Grossly intact via light touch bilateral.   Musculoskeletal: No significant pain on exam.  Not able to palpate any foreign objects.         Results RADIOLOGY Foot X-ray: Multiple views obtained.  No foreign object identified.  No evidence of acute fracture.   Assessment:   1. Laceration of left foot, initial encounter      Plan:  Patient was evaluated and treated and all questions answered.  Assessment and Plan Assessment & Plan Laceration of left foot without foreign body Laceration on left heel from shattered shower door. No foreign body noted on clinical exam.  Wound improving, not infected. Tetanus status uncertain.  Recommended to see his PCP for this.  He also has cuts on his hand. - Prescribed mupirocin ointment, sent to [facility]. - Instructed to wash wound with soap and water, dry, apply mupirocin, keep covered. - Advised to monitor for infection signs: swelling, redness, drainage; report if present. - Consider ultrasound if wound does not heal or worsens. - Check tetanus status with primary care, update if necessary.   Return if symptoms worsen or fail to improve.   Donnice JONELLE Fees DPM

## 2024-06-15 ENCOUNTER — Ambulatory Visit: Payer: MEDICAID | Admitting: Podiatry

## 2024-07-16 ENCOUNTER — Other Ambulatory Visit: Payer: Self-pay | Admitting: Podiatry

## 2024-07-21 ENCOUNTER — Emergency Department (HOSPITAL_COMMUNITY): Payer: MEDICAID

## 2024-07-21 ENCOUNTER — Emergency Department (HOSPITAL_COMMUNITY)
Admission: EM | Admit: 2024-07-21 | Discharge: 2024-07-21 | Disposition: A | Discharge status: Home / Self Care | Payer: MEDICAID | Attending: Emergency Medicine | Admitting: Emergency Medicine

## 2024-07-21 DIAGNOSIS — S39012A Strain of muscle, fascia and tendon of lower back, initial encounter: Secondary | ICD-10-CM | POA: Insufficient documentation

## 2024-07-21 DIAGNOSIS — X58XXXA Exposure to other specified factors, initial encounter: Secondary | ICD-10-CM | POA: Insufficient documentation

## 2024-07-21 DIAGNOSIS — Y9342 Activity, yoga: Secondary | ICD-10-CM | POA: Insufficient documentation

## 2024-07-21 MED ORDER — NAPROXEN 500 MG PO TABS: 500.0000 mg | ORAL_TABLET | Freq: Two times a day (BID) | ORAL | 0 refills | Status: AC

## 2024-07-21 MED ORDER — CYCLOBENZAPRINE HCL 10 MG PO TABS: 10.0000 mg | ORAL_TABLET | Freq: Two times a day (BID) | ORAL | 0 refills | Status: AC | PRN

## 2024-07-21 MED ORDER — NAPROXEN 500 MG PO TABS
500.0000 mg | ORAL_TABLET | Freq: Once | ORAL | Status: AC
  Administered 2024-07-21: 500 mg via ORAL
  Filled 2024-07-21: qty 1

## 2024-07-21 NOTE — ED Provider Notes (Signed)
 [place] EMERGENCY DEPARTMENT AT [facility] Provider Note   CSN: [id number] Arrival date & time: 07/21/24  9197     Patient presents with: Back Pain   [name] is a 43 y.o. male who presents to the ED today with complaints of severe lower back pain.  States he was in a yoga class last night when he had a sudden onset of lower back pain that was exacerbated with movement, rated at 5/10 last night.  After he went to bed he stated that this worsened to 10/10, took a previous prescription for Ultram  with some relief last night.  He presented to the ED today out of concern for possible spinal injury, pain control.  Review of his past medical history shows diagnoses of ADHD, bipolar 1, ulcerative colitis managed with colectomy.  Denies any bowel or bladder incontinence, denies any radicular symptoms, denies any numbness or tingling distally.    Back Pain      Prior to Admission medications   Medication Sig Start Date End Date Taking? Authorizing Provider  cyclobenzaprine  (FLEXERIL ) 10 MG tablet Take 1 tablet (10 mg total) by mouth 2 (two) times daily as needed for muscle spasms. 07/21/24  Yes Myriam Dorn BROCKS, PA  naproxen  (NAPROSYN ) 500 MG tablet Take 1 tablet (500 mg total) by mouth 2 (two) times daily. 07/21/24  Yes Myriam Dorn BROCKS, PA  acetaminophen  (TYLENOL ) 500 MG tablet Take 1,000 mg by mouth 4 (four) times daily.    [provider]  amphetamine -dextroamphetamine  (ADDERALL) 15 MG tablet Take 1 tablet by mouth 3 (three) times daily. 06/19/18   Cottle, Lorene KANDICE Raddle., MD  amphetamine -dextroamphetamine  (ADDERALL) 15 MG tablet Take 1 tablet by mouth 3 (three) times daily. 07/17/18   Cottle, Lorene KANDICE Raddle., MD  amphetamine -dextroamphetamine  (ADDERALL) 15 MG tablet Take 1 tablet by mouth 3 (three) times daily. 08/14/18   Cottle, Lorene KANDICE Raddle., MD  azaTHIOprine  (IMURAN ) 50 MG tablet TAKE (3) TABLETS DAILY. 04/02/14   Debrah Lamar BIRCH, MD  clonazePAM   (KLONOPIN ) 1 MG tablet Take 1 tablet (1 mg total) by mouth 3 (three) times daily. 08/14/18   Cottle, Lorene KANDICE Raddle., MD  doxycycline  (VIBRA -TABS) 100 MG tablet Take 1 tablet (100 mg total) by mouth 2 (two) times daily. 09/16/22   Tobie Franky SQUIBB, DPM  doxycycline  (VIBRA -TABS) 100 MG tablet Take 1 tablet (100 mg total) by mouth 2 (two) times daily. 09/16/22   Tobie Franky SQUIBB, DPM  EPINEPHrine  (EPIPEN ) 0.3 mg/0.3 mL SOAJ injection Inject 0.3 mLs (0.3 mg total) into the muscle as needed. 09/16/13   Sofia, Leslie K, PA-C  folic acid  (FOLVITE ) 1 MG tablet TAKE 1 TABLET EACH DAY. 04/02/14   Debrah Lamar BIRCH, MD  gabapentin  (NEURONTIN ) 600 MG tablet Take 1 tablet (600 mg total) by mouth 2 (two) times daily for 7 days. 09/15/18 06/12/24  Soto, Johana, PA-C  glycopyrrolate  (ROBINUL ) 2 MG tablet Take 1 tablet (2 mg total) by mouth 2 (two) times daily. 03/20/14   Avram Lupita BRAVO, MD  lamoTRIgine  (LAMICTAL ) 150 MG tablet Take 2 tablets (300 mg total) by mouth at bedtime for 7 days. 09/15/18 06/12/24  Soto, Johana, PA-C  loperamide  (IMODIUM ) 2 MG capsule Take 2 mg by mouth 5 (five) times daily.  04/27/18   [provider]  Multiple Vitamin (MULTIVITAMIN WITH MINERALS) TABS Take 1 tablet by mouth daily. As a nutritional supplement. 05/15/12   Readling, Darina BIRCH, MD  mupirocin  ointment (BACTROBAN ) 2 % USE 1 APPLICATION TOPICALLY  2 (TWO) TIMES DAILY. 07/16/24   Gershon Donnice SAUNDERS, DPM  predniSONE  (DELTASONE ) 20 MG tablet Take 40 mg for two weeks and then decrease to 30 mg once daily until follow up visit 02/26/14   Esterwood, [name] S, PA-C  pregabalin  (LYRICA ) 100 MG capsule Take 100 mg by mouth 2 (two) times daily.  01/26/18 06/12/24  [provider]  Probiotic Product (VSL#3 DS) PACK Take 1 each by mouth 4 (four) times daily. 03/15/14   Debrah Lamar BIRCH, MD  promethazine  (PHENERGAN ) 25 MG tablet TAKE 1 TABLET EVERY 6 HOURS AS NEEDED FOR NAUSEA & VOMITING. Patient taking differently: Take 25 mg by mouth every 8 (eight)  hours as needed for nausea.    Debrah Lamar BIRCH, MD  ziprasidone  (GEODON ) 40 MG capsule Take 1 capsule (40 mg total) by mouth 2 (two) times daily with a meal for 7 days. 1 in AM and 2 in PM 09/15/18 06/12/24  Soto, Johana, PA-C    Allergies: Cephalexin    Review of Systems  Musculoskeletal:  Positive for back pain.  All other systems reviewed and are negative.   Updated Vital Signs BP (!) 125/90 (BP Location: Left Arm)   Pulse 99   Temp 98 F (36.7 C) (Oral)   Resp 16   Ht 6' 2 (1.88 m)   Wt 88.5 kg   SpO2 100%   BMI 25.04 kg/m   Physical Exam Vitals and nursing note reviewed.  Constitutional:      General: He is awake. He is not in acute distress.    Appearance: Normal appearance. He is well-developed.  HENT:     Head: Normocephalic and atraumatic.     Mouth/Throat:     Mouth: Mucous membranes are moist.     Pharynx: Oropharynx is clear.  Eyes:     Extraocular Movements: Extraocular movements intact.     Conjunctiva/sclera: Conjunctivae normal.     Pupils: Pupils are equal, round, and reactive to light.  Cardiovascular:     Rate and Rhythm: Normal rate and regular rhythm.     Pulses: Normal pulses.     Heart sounds: Normal heart sounds. No murmur heard.    No friction rub. No gallop.  Pulmonary:     Effort: Pulmonary effort is normal.     Breath sounds: Normal breath sounds.  Abdominal:     General: Abdomen is flat. Bowel sounds are normal.     Palpations: Abdomen is soft.  Musculoskeletal:     Cervical back: Normal, normal range of motion and neck supple.     Thoracic back: Normal.     Lumbar back: No bony tenderness. Positive left straight leg raise test.     Right lower leg: No edema.     Left lower leg: No edema.  Skin:    General: Skin is warm and dry.     Capillary Refill: Capillary refill takes less than 2 seconds.  Neurological:     General: No focal deficit present.     Mental Status: He is alert. Mental status is at baseline.  Psychiatric:         Mood and Affect: Mood normal.        Behavior: Behavior is cooperative.     (all labs ordered are listed, but only abnormal results are displayed) Labs Reviewed - No data to display  EKG: None  Radiology: CT Lumbar Spine Wo Contrast Result Date: 07/21/2024 EXAM: CT OF THE LUMBAR SPINE WITHOUT CONTRAST 07/21/2024 08:49:19 AM TECHNIQUE: CT  of the lumbar spine was performed without the administration of intravenous contrast. Multiplanar reformatted images are provided for review. Automated exposure control, iterative reconstruction, and/or weight based adjustment of the mA/kV was utilized to reduce the radiation dose to as low as reasonably achievable. COMPARISON: CT abdomen 03/24/2024. CLINICAL HISTORY: Low back pain, no red flags, no prior management. FINDINGS: BONES AND ALIGNMENT: Normal vertebral body heights. No acute fracture or suspicious bone lesion. Normal alignment. The lowest lumbar type non-weightbearing vertebra is labeled as L5. DEGENERATIVE FINDINGS: T12-L1: Unremarkable. L1-L2: Disc bulge. No impingement. L2-L3: Unremarkable. L3-L4: Disc bulge. No impingement. L4-L5: Disc bulge. No impingement. Mild degenerative right facet arthropathy with an ossicle adjacent to the inferior tip of the inferior articular facet of L4. L5-S1: Right eccentric disc bulge and facet arthropathy. Borderline bilateral foraminal stenosis and borderline right subarticular lateral recess stenosis. SOFT TISSUES: Postoperative findings along pelvic bowel. IMPRESSION: 1. Borderline bilateral foraminal stenosis and borderline right subarticular lateral recess stenosis at L5-S1 due to right eccentric disc bulge and facet arthropathy. 2. Disc bulges at L1-2, L3-4, and L4-5 without impingement. Electronically signed by: Ryan Salvage MD 07/21/2024 08:56 AM EST RP Workstation: [id number]     Procedures   Medications Ordered in the ED  naproxen  (NAPROSYN ) tablet 500 mg (500 mg Oral Given 07/21/24 9161)                                     Medical Decision Making Amount and/or Complexity of Data Reviewed Radiology: ordered.  Risk Prescription drug management.   Medical Decision Making:   Rollo Farquhar is a 43 y.o. male who presented to the ED today with significant lower back pain detailed above.     Complete initial physical exam performed, notably the patient  was alert and oriented in no apparent distress.  There is no appreciable midline spinal tenderness nor is there any appreciable spinal deformity or step-off on exam.  He does have significant discomfort in maintaining an upright position, as well as with passive flexion of the left hip with positive straight leg test on the same.  Normal and intact sensation bilateral lower extremities.    Reviewed and confirmed nursing documentation for past medical history, family history, social history.    Initial Assessment:   With the patient's presentation of low back pain, most likely diagnosis is muscle strain of the lumbar spine.  Consider possible spondylolisthesis/spondylolysis of the lumbar spine, disc herniation.   Initial Plan:  Discussed with patient options for imaging, he opts for CT imaging of the lumbar spine to evaluate for bony abnormalities and disc herniation of the lumbar spine. Provide patient with oral naproxen  for management of pain Objective evaluation as below reviewed   Initial Study Results:    Radiology:  All images reviewed independently. Agree with radiology report at this time.   CT Lumbar Spine Wo Contrast Result Date: 07/21/2024 EXAM: CT OF THE LUMBAR SPINE WITHOUT CONTRAST 07/21/2024 08:49:19 AM TECHNIQUE: CT of the lumbar spine was performed without the administration of intravenous contrast. Multiplanar reformatted images are provided for review. Automated exposure control, iterative reconstruction, and/or weight based adjustment of the mA/kV was utilized to reduce the radiation dose to as low as  reasonably achievable. COMPARISON: CT abdomen 03/24/2024. CLINICAL HISTORY: Low back pain, no red flags, no prior management. FINDINGS: BONES AND ALIGNMENT: Normal vertebral body heights. No acute fracture or suspicious bone lesion. Normal alignment. The  lowest lumbar type non-weightbearing vertebra is labeled as L5. DEGENERATIVE FINDINGS: T12-L1: Unremarkable. L1-L2: Disc bulge. No impingement. L2-L3: Unremarkable. L3-L4: Disc bulge. No impingement. L4-L5: Disc bulge. No impingement. Mild degenerative right facet arthropathy with an ossicle adjacent to the inferior tip of the inferior articular facet of L4. L5-S1: Right eccentric disc bulge and facet arthropathy. Borderline bilateral foraminal stenosis and borderline right subarticular lateral recess stenosis. SOFT TISSUES: Postoperative findings along pelvic bowel. IMPRESSION: 1. Borderline bilateral foraminal stenosis and borderline right subarticular lateral recess stenosis at L5-S1 due to right eccentric disc bulge and facet arthropathy. 2. Disc bulges at L1-2, L3-4, and L4-5 without impingement. Electronically signed by: Ryan Salvage MD 07/21/2024 08:56 AM EST RP Workstation: [id number]     Reassessment and Plan:   Reassessed patient, he does have some subjective improvement in symptoms with Naprosyn  administration.  Imaging does not show any acute findings, does feel some mild disc bulge throughout the L-spine however there is no impingement noted.  At the lumbosacral junction there is some disc bulge with facet arthropathy however again this does not explain the patient's acute pain.  Given the imaging findings as well as the physical exam findings findings consistent with lumbar strain.  Will manage in the outpatient setting with continued naproxen  as well as cyclobenzaprine , careful discussion had with patient regarding use of muscle relaxers and safety, of which the patient verbalizes understanding and agreement.  At this time we will  discharge patient with outpatient follow-up to primary care within 2 weeks, provided him with a referral to neurosurgery should there be any need in the next several weeks for continuing pain.  Otherwise provided careful return precautions specifically regarding bowel or bladder incontinence, increasing numbness or paresthesia, or weakness that would signal possible cauda equina syndrome.  He again verbalizes understanding and agreement has no further concerns at this time.  As findings are unremarkable and patient is stable will discharge with outpatient follow-up as previously discussed.       Final diagnoses:  Strain of lumbar region, initial encounter    ED Discharge Orders          Ordered    cyclobenzaprine  (FLEXERIL ) 10 MG tablet  2 times daily PRN        07/21/24 0902    naproxen  (NAPROSYN ) 500 MG tablet  2 times daily        07/21/24 0902               Myriam Dorn BROCKS, PA 07/21/24 0912    Francesca Elsie CROME, MD 07/21/24 1038

## 2024-07-21 NOTE — Discharge Instructions (Addendum)
 As discussed, continue to use heating pad on your back, you can use muscle rubs as well however be careful to completely remove this prior to application of a heating pad as this can cause burns of left on.  Continue use medications as prescribed for management of your pain.  Would encourage you to follow-up with your primary care within the next 2 weeks for continued assessment and management of your back pain.  A referral for neurosurgery has been provided should you have any further concerns with chronic back pain that does not resolve within the next several weeks.  There was some noted disc bulge without any noted herniation at this time and symptoms seem largely consistent with a muscle strain of the lower back, however if you start to notice that you have incontinence of either bowel or bladder, or that you have increasing numbness/tingling in the legs and feet as well as increased weakness please return to the emergency department for further evaluation.

## 2024-07-21 NOTE — ED Triage Notes (Signed)
 Patient reports new onset lower right back pain after doing yoga last night Pain 9/10

## 2024-07-24 ENCOUNTER — Other Ambulatory Visit: Payer: Self-pay

## 2024-07-24 ENCOUNTER — Ambulatory Visit: Payer: MEDICAID | Admitting: Physical Therapy

## 2024-07-24 DIAGNOSIS — M6281 Muscle weakness (generalized): Secondary | ICD-10-CM | POA: Insufficient documentation

## 2024-07-24 DIAGNOSIS — M5459 Other low back pain: Secondary | ICD-10-CM | POA: Diagnosis present

## 2024-07-24 DIAGNOSIS — M25511 Pain in right shoulder: Secondary | ICD-10-CM | POA: Insufficient documentation

## 2024-07-24 NOTE — Therapy (Signed)
 OUTPATIENT PHYSICAL THERAPY SHOULDER EVALUATION   Patient Name: [name] MRN: [id number] DOB:1981/02/25, 43 y.o., male Today's Date: 07/24/2024  END OF SESSION:  PT End of Session - 07/24/24 1200     Visit Number 1    Date for Recertification  09/19/24    Authorization Type [employer] auth pending    PT Start Time 1200    PT Stop Time 1227    PT Time Calculation (min) 27 min    Activity Tolerance Patient tolerated treatment well    Behavior During Therapy WFL for tasks assessed/performed          Past Medical History:  Diagnosis Date   ADHD (attention deficit hyperactivity disorder)    Anxiety    Bipolar 1 disorder (HCC)    Depression    Encephalopathy acute 11/18/2015   Excessive weight loss    IBS (irritable bowel syndrome)    Insomnia    Ulcerative colitis    Past Surgical History:  Procedure Laterality Date   ANAL FISSURECTOMY     COLON SURGERY     COLONOSCOPY W/ BIOPSIES     hx ulcerative colitis   Patient Active Problem List   Diagnosis Date Noted   Financial difficulties 12/24/2017   Dental infection 10/20/2016   Nausea 05/31/2016   Rectal bleeding 05/28/2016   Borderline abnormal TFTs 04/20/2016   Other social stressor 01/28/2016   Falls 12/19/2015   Visual acuity reduced 12/19/2015   Orthostatic hypotension 11/18/2015   Encephalopathy acute 11/18/2015   Limited mobility 10/27/2015   Abdominal pain 09/12/2015   Mixed bipolar I disorder (HCC) 03/18/2015   Feeding difficulties and mismanagement 07/20/2013   Immunocompromised state (HCC) 07/20/2013   Protein-calorie malnutrition, severe 07/09/2013   EKG, abnormal 05/14/2012   ADHD (attention deficit hyperactivity disorder) 05/12/2012   Insomnia 05/06/2010   HYPERCHOLESTEROLEMIA, PURE 06/14/2008   Anxiety state 11/27/2007   Ulcerative colitis (HCC) 04/26/2001    PCP: Clerance Lonell HERO, MD   REFERRING PROVIDER: Clerance Lonell HERO, MD   REFERRING DIAG:  M25.511 (ICD-10-CM) -  Right shoulder pain  S46.011A (ICD-10-CM) - Strain of muscle(s) and tendon(s) of the rotator cuff of right shoulder, initial encounter    THERAPY DIAG:  Acute pain of right shoulder  Muscle weakness (generalized)  Other low back pain  Rationale for Evaluation and Treatment: Rehabilitation  ONSET DATE: 4-5 weeks ago  SUBJECTIVE:                                                                                                                                                                                      SUBJECTIVE STATEMENT:  Right shoulder pain. Working out 5-6  days per week and may have happened from that. Also reporting R piriformis pain from yoga. Some pain with sleeping, more pressure than pain. Hand dominance: Left  PERTINENT HISTORY:  acute LBP, bipolar, cholectomy  PAIN:  Are you having pain? Yes: NPRS scale: 0/10  up to 8/10 with weightbearing Pain location: right shoulder Pain description: gritty feeling with side planks and full ER at 90 deg Aggravating factors: side planks and ER  Relieving factors: rest  PRECAUTIONS: None  RED FLAGS: None   WEIGHT BEARING RESTRICTIONS: No  FALLS:  Has patient fallen in last 6 months? No  LIVING ENVIRONMENT: Lives with: lives with their family Lives in: House/apartment  OCCUPATION: Disabled due to cholectomy  PLOF: Independent  PATIENT GOALS:Get rid of pain and get to previous level of function.  NEXT MD VISIT: none scheduled  OBJECTIVE:  Note: Objective measures were completed at Evaluation unless otherwise noted.  DIAGNOSTIC FINDINGS:  none  PATIENT SURVEYS:  THE PATIENT SPECIFIC FUNCTIONAL SCALE  Place score of 0-10 (0 = unable to perform activity and 10 = able to perform activity at the same level as before injury or problem)  Activity Date: 07/24/24    sleeping 7    2. Weightbearing (yoga eg) 6    3.     4.      Total Score 6.5      Total Score = Sum of activity scores/number of  activities  Minimally Detectable Change: 3 points (for single activity); 2 points (for average score)  [facility] (nd). The Patient Specific Functional Scale . Retrieved from Skateo[employer].[email or web address]    COGNITION: Overall cognitive status: Within functional limits for tasks assessed     SENSATION: WFL  POSTURE: Increased tone in R levator  UPPER EXTREMITY ROM: Full   UPPER EXTREMITY MMT:  MMT Right eval Left eval  Shoulder flexion 4+   Shoulder extension 4   Shoulder abduction 5   Shoulder adduction    Shoulder internal rotation 4+*   Shoulder external rotation 5   Middle trapezius    Lower trapezius    Elbow flexion    Elbow extension    Grip strength (lbs)    (Blank rows = not tested)  SHOULDER SPECIAL TESTS: Impingement tests: Neer impingement test: negative and Hawkins/Kennedy impingement test: positive  Rotator cuff assessment: Empty can test: positive , Full can test: negative, and Gerber lift off test: negative   JOINT MOBILITY TESTING:  NT  PALPATION:  Palpation: TTP at ant shoulder. Increased tissue tension in R levator                                                                                                                             TREATMENT DATE:  07/24/24  See pt ed and HEP  If treatment provided at initial evaluation, no treatment charged due to lack of authorization.    PATIENT EDUCATION: Education details: PT eval findings, anticipated POC, initial HEP, and  postural awareness   Person educated: Patient Education method: Explanation, Demonstration, Tactile cues, Verbal cues, and Handouts Education comprehension: verbalized understanding and returned demonstration  HOME EXERCISE PROGRAM: Access Code: [email or web address] URL: [email or web address] Date: 07/24/2024 Prepared by: Mliss  Exercises - Single Arm Shoulder Extension with Resistance  - 1 x daily - 3 x weekly  - 1-3 sets - 10 reps - Shoulder External Rotation with Anchored Resistance  - 1 x daily - 3 x weekly - 1-3 sets - 10 reps - Shoulder Internal Rotation with Resistance  - 1 x daily - 3 x weekly - 1-3 sets - 10 reps - Standing Single Arm Shoulder Flexion with Posterior Anchored Resistance (Mirrored)  - 1 x daily - 3 x weekly - 2 sets - 10 reps  ASSESSMENT:  CLINICAL IMPRESSION: Patient was 15 min late to appointment. He is a 43 y.o. male who was seen today for physical therapy evaluation and treatment for right shoulder pain. He demonstrates deficits in right shoulder strength, flexibility and posture. Special tests indicate he has a possible RC impingement. He has pain with resisted IR, ER, flexion and extension.  Deficits affect his ability to sleep and weight bear through the R arm. He will benefit from skilled PT to address these deficits and those listed below. He also reports acute piriformis pain/low back pain which happened during yoga last week. Pain has improved, but CT shows bulging discs. This was not assessed today.   OBJECTIVE IMPAIRMENTS: decreased activity tolerance, decreased knowledge of condition, decreased strength, hypomobility, increased fascial restrictions, increased muscle spasms, impaired flexibility, impaired UE functional use, postural dysfunction, and pain.   ACTIVITY LIMITATIONS: lifting, sleeping, and weightbearing through shoulder  PARTICIPATION LIMITATIONS: not restricted but painful  PERSONAL FACTORS: 1-2 comorbidities: bipolar depression, acute low back pain are also affecting patient's functional outcome.   REHAB POTENTIAL: Excellent  CLINICAL DECISION MAKING: Evolving/moderate complexity  EVALUATION COMPLEXITY: Low   GOALS: Goals reviewed with patient? Yes  SHORT TERM GOALS: Target date: 08/22/24  Patient able to sleep without pain Baseline: Goal status: INITIAL  2.  Patient will be independent with initial HEP  Baseline:  Goal status:  INITIAL    LONG TERM GOALS: Target date: 09/19/24  Patient to report pain no greater than 2/10 with weightbearing and ADLs Baseline:  Goal status: INITIAL  2.  Patient to be independent with advanced HEP  Baseline:  Goal status: INITIAL  3.  Patient to demonstrate 5/5 R shoulder strength to improve function.  Baseline:  Goal status: INITIAL  4.  Average PSFS score to improve by 2 points showing functional improvement. Baseline: 6.5 Goal status: INITIAL   PLAN:  PT FREQUENCY: 2x/week  PT DURATION: 8 weeks  PLANNED INTERVENTIONS: 97164- PT Re-evaluation, 97110-Therapeutic exercises, 97530- Therapeutic activity, 97112- Neuromuscular re-education, 97535- Self Care, 02859- Manual therapy, [phone]- Aquatic Therapy, [phone]- Electrical stimulation (unattended), 97016- Vasopneumatic device, [id number]- Ultrasound, [id number]- Ionotophoresis 4mg /ml Dexamethasone, 79439 (1-2 muscles), 20561 (3+ muscles)- Dry Needling, Patient/Family education, Taping, Joint mobilization, Spinal mobilization, Cryotherapy, and Moist heat  PLAN FOR NEXT SESSION: Review and progress HEP, R shoulder and postural strengthening, manual/DN to R UQ, spinal mobs   Mliss Cummins, PT 07/24/24 1:44 PM

## 2024-07-25 NOTE — Addendum Note (Signed)
 Addended by: Maritssa Haughton J on: 07/25/2024 09:44 AM   Modules accepted: Orders

## 2024-07-31 ENCOUNTER — Ambulatory Visit: Payer: MEDICAID

## 2024-07-31 DIAGNOSIS — M25511 Pain in right shoulder: Secondary | ICD-10-CM | POA: Diagnosis not present

## 2024-07-31 DIAGNOSIS — M6281 Muscle weakness (generalized): Secondary | ICD-10-CM

## 2024-07-31 DIAGNOSIS — M5459 Other low back pain: Secondary | ICD-10-CM

## 2024-07-31 NOTE — Therapy (Signed)
 OUTPATIENT PHYSICAL THERAPY TREATMENT   Patient Name: [name] MRN: [id number] DOB:27-Jun-1981, 43 y.o., male Today's Date: 07/31/2024  END OF SESSION:  PT End of Session - 07/31/24 0844     Visit Number 2    Date for Recertification  09/19/24    Authorization Type [employer] VISITS 07/24/24-09/19/24    Authorization - Visit Number 1    Authorization - Number of Visits 16    PT Start Time 0803    PT Stop Time 0844    PT Time Calculation (min) 41 min    Activity Tolerance Patient tolerated treatment well    Behavior During Therapy [facility] for tasks assessed/performed           Past Medical History:  Diagnosis Date   ADHD (attention deficit hyperactivity disorder)    Anxiety    Bipolar 1 disorder (HCC)    Depression    Encephalopathy acute 11/18/2015   Excessive weight loss    IBS (irritable bowel syndrome)    Insomnia    Ulcerative colitis    Past Surgical History:  Procedure Laterality Date   ANAL FISSURECTOMY     COLON SURGERY     COLONOSCOPY W/ BIOPSIES     hx ulcerative colitis   Patient Active Problem List   Diagnosis Date Noted   Financial difficulties 12/24/2017   Dental infection 10/20/2016   Nausea 05/31/2016   Rectal bleeding 05/28/2016   Borderline abnormal TFTs 04/20/2016   Other social stressor 01/28/2016   Falls 12/19/2015   Visual acuity reduced 12/19/2015   Orthostatic hypotension 11/18/2015   Encephalopathy acute 11/18/2015   Limited mobility 10/27/2015   Abdominal pain 09/12/2015   Mixed bipolar I disorder (HCC) 03/18/2015   Feeding difficulties and mismanagement 07/20/2013   Immunocompromised state (HCC) 07/20/2013   Protein-calorie malnutrition, severe 07/09/2013   EKG, abnormal 05/14/2012   ADHD (attention deficit hyperactivity disorder) 05/12/2012   Insomnia 05/06/2010   HYPERCHOLESTEROLEMIA, PURE 06/14/2008   Anxiety state 11/27/2007   Ulcerative colitis (HCC) 04/26/2001    PCP: Clerance Lonell HERO, MD   REFERRING  PROVIDER: Claretha Almarie Anon FNP   REFERRING DIAG:  M25.511 (ICD-10-CM) - Right shoulder pain  S46.011A (ICD-10-CM) - Strain of muscle(s) and tendon(s) of the rotator cuff of right shoulder, initial encounter    THERAPY DIAG:  Acute pain of right shoulder  Muscle weakness (generalized)  Other low back pain  Rationale for Evaluation and Treatment: Rehabilitation  ONSET DATE: 4-5 weeks ago  SUBJECTIVE:  SUBJECTIVE STATEMENT: I can still feel it and it is doing better.  I can do side plank for a few seconds now.  Hand dominance: Left  PERTINENT HISTORY:  acute LBP, bipolar, cholectomy  PAIN:  Are you having pain? Yes: NPRS scale: 2-3/10 up to 6/10with weightbearing Pain location: right shoulder Pain description: gritty feeling with side planks and full ER at 90 deg Aggravating factors: side planks and ER  Relieving factors: rest  PRECAUTIONS: None  RED FLAGS: None   WEIGHT BEARING RESTRICTIONS: No  FALLS:  Has patient fallen in last 6 months? No  LIVING ENVIRONMENT: Lives with: lives with their family Lives in: House/apartment  OCCUPATION: Disabled due to cholectomy  PLOF: Independent  PATIENT GOALS:Get rid of pain and get to previous level of function.  NEXT MD VISIT: none scheduled  OBJECTIVE:  Note: Objective measures were completed at Evaluation unless otherwise noted.  DIAGNOSTIC FINDINGS:  none  PATIENT SURVEYS:  THE PATIENT SPECIFIC FUNCTIONAL SCALE  Place score of 0-10 (0 = unable to perform activity and 10 = able to perform activity at the same level as before injury or problem)  Activity Date: 07/24/24    sleeping 7    2. Weightbearing (yoga eg) 6    3.     4.      Total Score 6.5      Total Score = Sum of activity scores/number of  activities  Minimally Detectable Change: 3 points (for single activity); 2 points (for average score)  [facility] (nd). The Patient Specific Functional Scale . Retrieved from [employer].[email or web address]    COGNITION: Overall cognitive status: Within functional limits for tasks assessed     SENSATION: WFL  POSTURE: Increased tone in R levator  UPPER EXTREMITY ROM: Full   UPPER EXTREMITY MMT:  MMT Right eval Left eval  Shoulder flexion 4+   Shoulder extension 4   Shoulder abduction 5   Shoulder adduction    Shoulder internal rotation 4+*   Shoulder external rotation 5   Middle trapezius    Lower trapezius    Elbow flexion    Elbow extension    Grip strength (lbs)    (Blank rows = not tested)  SHOULDER SPECIAL TESTS: Impingement tests: Neer impingement test: negative and Hawkins/Kennedy impingement test: positive  Rotator cuff assessment: Empty can test: positive , Full can test: negative, and Gerber lift off test: negative   JOINT MOBILITY TESTING:  NT  PALPATION:  Palpation: TTP at ant shoulder. Increased tissue tension in R levator                                                                                                                             TREATMENT DATE:  07/31/24   Arm bike: Level 2x 6 minutes (3/3)- PT present to discuss progress  Red theraband: IR/ER flexion and extension x10 -verbal cues for scapular alignment and to avoid pain Doorway stretch 3x20 seconds  Open book  in sidelying with foam roll x10 Sidelying ER on Rt: pain with 2#, 2x10 with no weight, sidelying abduction 2x10 Supine on foam roll: red theraband horizontal abduction, D2 and ER 2x10  07/24/24  See pt ed and HEP  If treatment provided at initial evaluation, no treatment charged due to lack of authorization.    PATIENT EDUCATION: Education details: PT eval findings, anticipated POC, initial HEP, and postural  awareness   Person educated: Patient Education method: Explanation, Demonstration, Tactile cues, Verbal cues, and Handouts Education comprehension: verbalized understanding and returned demonstration  HOME EXERCISE PROGRAM: Access Code: [email or web address] URL: [email or web address] Date: 07/31/2024 Prepared by: Burnard  Exercises - Single Arm Shoulder Extension with Resistance  - 1 x daily - 3 x weekly - 1-3 sets - 10 reps - Shoulder External Rotation with Anchored Resistance  - 1 x daily - 3 x weekly - 1-3 sets - 10 reps - Shoulder Internal Rotation with Resistance  - 1 x daily - 3 x weekly - 1-3 sets - 10 reps - Standing Single Arm Shoulder Flexion with Posterior Anchored Resistance (Mirrored)  - 1 x daily - 3 x weekly - 2 sets - 10 reps - Sidelying Open Book Thoracic Rotation with Knee on Foam Roll  - 2 x daily - 7 x weekly - 1 sets - 10 reps - 5 hold - Supine Shoulder Horizontal Abduction with Resistance  - 2 x daily - 7 x weekly - 2 sets - 10 reps - Supine Bilateral Shoulder External Rotation with Resistance  - 2 x daily - 7 x weekly - 2 sets - 10 reps - Supine PNF D2 Flexion with Resistance  - 2 x daily - 7 x weekly - 2 sets - 10 reps ASSESSMENT:  CLINICAL IMPRESSION: First time follow-up after evaluation.  Pt reports that he is now able to do a side plank in yoga for a few seconds.  Pt is compliant with initial HEP and reports some posterior shoulder discomfort with ER with band.  PT educated pt to keep movements within pain free range if needed.  PT monitored throughout session for pain, alignment and fatigue. Patient will benefit from skilled PT to address the below impairments and improve overall function.   OBJECTIVE IMPAIRMENTS: decreased activity tolerance, decreased knowledge of condition, decreased strength, hypomobility, increased fascial restrictions, increased muscle spasms, impaired flexibility, impaired UE functional use, postural dysfunction, and pain.   ACTIVITY  LIMITATIONS: lifting, sleeping, and weightbearing through shoulder  PARTICIPATION LIMITATIONS: not restricted but painful  PERSONAL FACTORS: 1-2 comorbidities: bipolar depression, acute low back pain are also affecting patient's functional outcome.   REHAB POTENTIAL: Excellent  CLINICAL DECISION MAKING: Evolving/moderate complexity  EVALUATION COMPLEXITY: Low   GOALS: Goals reviewed with patient? Yes  SHORT TERM GOALS: Target date: 08/22/24  Patient able to sleep without pain Baseline: Goal status: INITIAL  2.  Patient will be independent with initial HEP  Baseline: 07/31/24 Goal status: In progress     LONG TERM GOALS: Target date: 09/19/24  Patient to report pain no greater than 2/10 with weightbearing and ADLs Baseline:  Goal status: INITIAL  2.  Patient to be independent with advanced HEP  Baseline:  Goal status: INITIAL  3.  Patient to demonstrate 5/5 R shoulder strength to improve function.  Baseline:  Goal status: INITIAL  4.  Average PSFS score to improve by 2 points showing functional improvement. Baseline: 6.5 Goal status: INITIAL   PLAN:  PT FREQUENCY: 2x/week  PT DURATION: 8 weeks  PLANNED  INTERVENTIONS: 97164- PT Re-evaluation, 97110-Therapeutic exercises, 97530- Therapeutic activity, 97112- Neuromuscular re-education, [phone]- Self Care, 02859- Manual therapy, [phone]- Aquatic Therapy, [phone]- Electrical stimulation (unattended), 97016- Vasopneumatic device, [id number]- Ultrasound, [id number]- Ionotophoresis 4mg /ml Dexamethasone, 20560 (1-2 muscles), 20561 (3+ muscles)- Dry Needling, Patient/Family education, Taping, Joint mobilization, Spinal mobilization, Cryotherapy, and Moist heat  PLAN FOR NEXT SESSION: Rt shoulder and postural strengthening, manual/DN to R UQ, spinal mobs   Burnard Joy, PT 07/31/2024 8:47 AM

## 2024-08-02 ENCOUNTER — Ambulatory Visit: Payer: MEDICAID

## 2024-08-02 DIAGNOSIS — M5459 Other low back pain: Secondary | ICD-10-CM

## 2024-08-02 DIAGNOSIS — M25511 Pain in right shoulder: Secondary | ICD-10-CM

## 2024-08-02 DIAGNOSIS — M6281 Muscle weakness (generalized): Secondary | ICD-10-CM

## 2024-08-02 NOTE — Therapy (Signed)
 OUTPATIENT PHYSICAL THERAPY TREATMENT   Patient Name: [name] MRN: [id number] DOB:February 09, 1981, 43 y.o., male Today's Date: 08/02/2024  END OF SESSION:  PT End of Session - 08/02/24 0810     Visit Number 3    Date for Recertification  09/19/24    Authorization Type [employer]-16 VISITS 07/24/24-09/19/24    Authorization - Visit Number 2    Authorization - Number of Visits 16    PT Start Time 0730    PT Stop Time 0802    PT Time Calculation (min) 32 min    Activity Tolerance Patient tolerated treatment well    Behavior During Therapy [facility] for tasks assessed/performed            Past Medical History:  Diagnosis Date   ADHD (attention deficit hyperactivity disorder)    Anxiety    Bipolar 1 disorder (HCC)    Depression    Encephalopathy acute 11/18/2015   Excessive weight loss    IBS (irritable bowel syndrome)    Insomnia    Ulcerative colitis    Past Surgical History:  Procedure Laterality Date   ANAL FISSURECTOMY     COLON SURGERY     COLONOSCOPY W/ BIOPSIES     hx ulcerative colitis   Patient Active Problem List   Diagnosis Date Noted   Financial difficulties 12/24/2017   Dental infection 10/20/2016   Nausea 05/31/2016   Rectal bleeding 05/28/2016   Borderline abnormal TFTs 04/20/2016   Other social stressor 01/28/2016   Falls 12/19/2015   Visual acuity reduced 12/19/2015   Orthostatic hypotension 11/18/2015   Encephalopathy acute 11/18/2015   Limited mobility 10/27/2015   Abdominal pain 09/12/2015   Mixed bipolar I disorder (HCC) 03/18/2015   Feeding difficulties and mismanagement 07/20/2013   Immunocompromised state (HCC) 07/20/2013   Protein-calorie malnutrition, severe 07/09/2013   EKG, abnormal 05/14/2012   ADHD (attention deficit hyperactivity disorder) 05/12/2012   Insomnia 05/06/2010   HYPERCHOLESTEROLEMIA, PURE 06/14/2008   Anxiety state 11/27/2007   Ulcerative colitis (HCC) 04/26/2001    PCP: Clerance Lonell HERO, MD   REFERRING  PROVIDER: Claretha Almarie Anon FNP   REFERRING DIAG:  M25.511 (ICD-10-CM) - Right shoulder pain  S46.011A (ICD-10-CM) - Strain of muscle(s) and tendon(s) of the rotator cuff of right shoulder, initial encounter    THERAPY DIAG:  Acute pain of right shoulder  Muscle weakness (generalized)  Other low back pain  Rationale for Evaluation and Treatment: Rehabilitation  ONSET DATE: 4-5 weeks ago  SUBJECTIVE:  SUBJECTIVE STATEMENT: I am still able to do yoga and plank on my side for a few seconds.  Hand dominance: Left  PERTINENT HISTORY:  acute LBP, bipolar, cholectomy  PAIN: 08/02/24 Are you having pain? Yes: NPRS scale: 2-3/10 up to 6/10with weightbearing Pain location: right shoulder Pain description: gritty feeling with side planks and full ER at 90 deg Aggravating factors: side planks and ER  Relieving factors: rest  PRECAUTIONS: None  RED FLAGS: None   WEIGHT BEARING RESTRICTIONS: No  FALLS:  Has patient fallen in last 6 months? No  LIVING ENVIRONMENT: Lives with: lives with their family Lives in: House/apartment  OCCUPATION: Disabled due to cholectomy  PLOF: Independent  PATIENT GOALS:Get rid of pain and get to previous level of function.  NEXT MD VISIT: none scheduled  OBJECTIVE:  Note: Objective measures were completed at Evaluation unless otherwise noted.  DIAGNOSTIC FINDINGS:  none  PATIENT SURVEYS:  THE PATIENT SPECIFIC FUNCTIONAL SCALE  Place score of 0-10 (0 = unable to perform activity and 10 = able to perform activity at the same level as before injury or problem)  Activity Date: 07/24/24    sleeping 7    2. Weightbearing (yoga eg) 6    3.     4.      Total Score 6.5      Total Score = Sum of activity scores/number of activities  Minimally  Detectable Change: 3 points (for single activity); 2 points (for average score)  [facility] (nd). The Patient Specific Functional Scale . Retrieved from [employer]ateo[employer].com.pt    COGNITION: Overall cognitive status: Within functional limits for tasks assessed     SENSATION: WFL  POSTURE: Increased tone in R levator  UPPER EXTREMITY ROM: Full   UPPER EXTREMITY MMT:  MMT Right eval Left eval  Shoulder flexion 4+   Shoulder extension 4   Shoulder abduction 5   Shoulder adduction    Shoulder internal rotation 4+*   Shoulder external rotation 5   Middle trapezius    Lower trapezius    Elbow flexion    Elbow extension    Grip strength (lbs)    (Blank rows = not tested)  SHOULDER SPECIAL TESTS: Impingement tests: Neer impingement test: negative and Hawkins/Kennedy impingement test: positive  Rotator cuff assessment: Empty can test: positive , Full can test: negative, and Gerber lift off test: negative   JOINT MOBILITY TESTING:  NT  PALPATION:  Palpation: TTP at ant shoulder. Increased tissue tension in R levator                                                                                                                             TREATMENT DATE:  08/02/24   Arm bike: Level 2x 6 minutes (3/3)- PT present to discuss progress  Wall push-ups 2x10 Wall clocks: blue loop 2x5 each  Red theraband: IR/ER flexion and extension x10 -verbal cues for scapular alignment and to avoid pain Doorway stretch  3x20 seconds  Open book in sidelying with foam roll x10 Sidelying ER on Rt: pain with 2# 2x10 sidelying abduction 2x10 Supine on foam roll: red theraband horizontal abduction, D2 and ER 2x10   07/31/24   Arm bike: Level 2x 6 minutes (3/3)- PT present to discuss progress  Red theraband: IR/ER flexion and extension x10 -verbal cues for scapular alignment and to avoid pain Doorway stretch 3x20 seconds  Open book  in sidelying with foam roll x10 Sidelying ER on Rt: pain with 2#, 2x10 with no weight, sidelying abduction 2x10 Supine on foam roll: red theraband horizontal abduction, D2 and ER 2x10  07/24/24  See pt ed and HEP  If treatment provided at initial evaluation, no treatment charged due to lack of authorization.    PATIENT EDUCATION: Education details: PT eval findings, anticipated POC, initial HEP, and postural awareness   Person educated: Patient Education method: Explanation, Demonstration, Tactile cues, Verbal cues, and Handouts Education comprehension: verbalized understanding and returned demonstration  HOME EXERCISE PROGRAM: Access Code: [email or web address] URL: [email or web address] Date: 07/31/2024 Prepared by: Burnard  Exercises - Single Arm Shoulder Extension with Resistance  - 1 x daily - 3 x weekly - 1-3 sets - 10 reps - Shoulder External Rotation with Anchored Resistance  - 1 x daily - 3 x weekly - 1-3 sets - 10 reps - Shoulder Internal Rotation with Resistance  - 1 x daily - 3 x weekly - 1-3 sets - 10 reps - Standing Single Arm Shoulder Flexion with Posterior Anchored Resistance (Mirrored)  - 1 x daily - 3 x weekly - 2 sets - 10 reps - Sidelying Open Book Thoracic Rotation with Knee on Foam Roll  - 2 x daily - 7 x weekly - 1 sets - 10 reps - 5 hold - Supine Shoulder Horizontal Abduction with Resistance  - 2 x daily - 7 x weekly - 2 sets - 10 reps - Supine Bilateral Shoulder External Rotation with Resistance  - 2 x daily - 7 x weekly - 2 sets - 10 reps - Supine PNF D2 Flexion with Resistance  - 2 x daily - 7 x weekly - 2 sets - 10 reps ASSESSMENT:  CLINICAL IMPRESSION: Pt continues to do well with exercise progression.   He is working to become more compliant with new exercises at home.  He continues to experience end range pain with resistive movement and he is modifying his movements to reduce pain.  PT monitored throughout session for pain, alignment and fatigue. Patient  will benefit from skilled PT to address the below impairments and improve overall function.   OBJECTIVE IMPAIRMENTS: decreased activity tolerance, decreased knowledge of condition, decreased strength, hypomobility, increased fascial restrictions, increased muscle spasms, impaired flexibility, impaired UE functional use, postural dysfunction, and pain.   ACTIVITY LIMITATIONS: lifting, sleeping, and weightbearing through shoulder  PARTICIPATION LIMITATIONS: not restricted but painful  PERSONAL FACTORS: 1-2 comorbidities: bipolar depression, acute low back pain are also affecting patient's functional outcome.   REHAB POTENTIAL: Excellent  CLINICAL DECISION MAKING: Evolving/moderate complexity  EVALUATION COMPLEXITY: Low   GOALS: Goals reviewed with patient? Yes  SHORT TERM GOALS: Target date: 08/22/24  Patient able to sleep without pain Baseline:  Goal status: INITIAL  2.  Patient will be independent with initial HEP  Baseline: 07/31/24 Goal status: In progress     LONG TERM GOALS: Target date: 09/19/24  Patient to report pain no greater than 2/10 with weightbearing and ADLs Baseline: 6-7/10 (08/02/24) Goal status: In progress  2.  Patient to be independent with advanced HEP  Baseline:  Goal status: INITIAL  3.  Patient to demonstrate 5/5 R shoulder strength to improve function.  Baseline:  Goal status: INITIAL  4.  Average PSFS score to improve by 2 points showing functional improvement. Baseline: 6.5 Goal status: INITIAL   PLAN:  PT FREQUENCY: 2x/week  PT DURATION: 8 weeks  PLANNED INTERVENTIONS: 97164- PT Re-evaluation, 97110-Therapeutic exercises, 97530- Therapeutic activity, 97112- Neuromuscular re-education, 97535- Self Care, 02859- Manual therapy, [phone]- Aquatic Therapy, [phone]- Electrical stimulation (unattended), 97016- Vasopneumatic device, [id number]- Ultrasound, [id number]- Ionotophoresis 4mg /ml Dexamethasone, 79439 (1-2 muscles), 20561 (3+ muscles)- Dry Needling,  Patient/Family education, Taping, Joint mobilization, Spinal mobilization, Cryotherapy, and Moist heat  PLAN FOR NEXT SESSION: Rt shoulder and postural strengthening, manual/DN to R UQ, spinal mobs as needed    [employer], PT 08/02/2024 8:15 AM

## 2024-08-08 ENCOUNTER — Ambulatory Visit: Payer: MEDICAID | Admitting: Physical Therapy

## 2024-08-08 ENCOUNTER — Telehealth: Payer: Self-pay | Admitting: Physical Therapy

## 2024-08-08 NOTE — Telephone Encounter (Signed)
 Called regarding missed appt today.  He states he never got a text about the appt today from the wait list.  Reminded of next appt on 12/31 at 3:30.

## 2024-08-15 ENCOUNTER — Ambulatory Visit: Payer: MEDICAID | Admitting: Physical Therapy

## 2024-08-15 ENCOUNTER — Encounter: Payer: Self-pay | Admitting: Physical Therapy

## 2024-08-15 DIAGNOSIS — M6281 Muscle weakness (generalized): Secondary | ICD-10-CM

## 2024-08-15 DIAGNOSIS — M25511 Pain in right shoulder: Secondary | ICD-10-CM

## 2024-08-15 DIAGNOSIS — M5459 Other low back pain: Secondary | ICD-10-CM

## 2024-08-15 NOTE — Therapy (Signed)
 " OUTPATIENT PHYSICAL THERAPY TREATMENT   Patient Name: [name] MRN: [id number] DOB:1981/05/23, 43 y.o., male Today's Date: 08/15/2024  END OF SESSION:  PT End of Session - 08/15/24 1153     Visit Number 4    Date for Recertification  09/19/24    Authorization Type [employer]-16 VISITS 07/24/24-09/19/24    Authorization - Visit Number 3    Authorization - Number of Visits 16    PT Start Time 1105    PT Stop Time 1145    PT Time Calculation (min) 40 min    Activity Tolerance Patient tolerated treatment well    Behavior During Therapy WFL for tasks assessed/performed             Past Medical History:  Diagnosis Date   ADHD (attention deficit hyperactivity disorder)    Anxiety    Bipolar 1 disorder (HCC)    Depression    Encephalopathy acute 11/18/2015   Excessive weight loss    IBS (irritable bowel syndrome)    Insomnia    Ulcerative colitis    Past Surgical History:  Procedure Laterality Date   ANAL FISSURECTOMY     COLON SURGERY     COLONOSCOPY W/ BIOPSIES     hx ulcerative colitis   Patient Active Problem List   Diagnosis Date Noted   Financial difficulties 12/24/2017   Dental infection 10/20/2016   Nausea 05/31/2016   Rectal bleeding 05/28/2016   Borderline abnormal TFTs 04/20/2016   Other social stressor 01/28/2016   Falls 12/19/2015   Visual acuity reduced 12/19/2015   Orthostatic hypotension 11/18/2015   Encephalopathy acute 11/18/2015   Limited mobility 10/27/2015   Abdominal pain 09/12/2015   Mixed bipolar I disorder (HCC) 03/18/2015   Feeding difficulties and mismanagement 07/20/2013   Immunocompromised state (HCC) 07/20/2013   Protein-calorie malnutrition, severe 07/09/2013   EKG, abnormal 05/14/2012   ADHD (attention deficit hyperactivity disorder) 05/12/2012   Insomnia 05/06/2010   HYPERCHOLESTEROLEMIA, PURE 06/14/2008   Anxiety state 11/27/2007   Ulcerative colitis (HCC) 04/26/2001    PCP: Clerance Lonell HERO, MD   REFERRING  PROVIDER: Claretha Almarie Anon FNP   REFERRING DIAG:  M25.511 (ICD-10-CM) - Right shoulder pain  S46.011A (ICD-10-CM) - Strain of muscle(s) and tendon(s) of the rotator cuff of right shoulder, initial encounter    THERAPY DIAG:  Acute pain of right shoulder  Muscle weakness (generalized)  Other low back pain  Rationale for Evaluation and Treatment: Rehabilitation  ONSET DATE: 4-5 weeks ago  SUBJECTIVE:  SUBJECTIVE STATEMENT: Patient reports he is doing good today. His shoulder feels gritty.He feels he is going 75% in the gym. He is able to do a side plank for about 4-5 seconds. Hand dominance: Left  PERTINENT HISTORY:  acute LBP, bipolar, cholectomy  PAIN: 08/02/24 Are you having pain? Yes: NPRS scale: 2-3/10 up to 6/10with weightbearing Pain location: right shoulder Pain description: gritty feeling with side planks and full ER at 90 deg Aggravating factors: side planks and ER  Relieving factors: rest  PRECAUTIONS: None  RED FLAGS: None   WEIGHT BEARING RESTRICTIONS: No  FALLS:  Has patient fallen in last 6 months? No  LIVING ENVIRONMENT: Lives with: lives with their family Lives in: House/apartment  OCCUPATION: Disabled due to cholectomy  PLOF: Independent  PATIENT GOALS:Get rid of pain and get to previous level of function.  NEXT MD VISIT: none scheduled  OBJECTIVE:  Note: Objective measures were completed at Evaluation unless otherwise noted.  DIAGNOSTIC FINDINGS:  none  PATIENT SURVEYS:  THE PATIENT SPECIFIC FUNCTIONAL SCALE  Place score of 0-10 (0 = unable to perform activity and 10 = able to perform activity at the same level as before injury or problem)  Activity Date: 07/24/24    sleeping 7    2. Weightbearing (yoga eg) 6    3.     4.      Total Score 6.5       Total Score = Sum of activity scores/number of activities  Minimally Detectable Change: 3 points (for single activity); 2 points (for average score)  [employer] [facility] (nd). The Patient Specific Functional Scale . Retrieved from [employer].com.pt    COGNITION: Overall cognitive status: Within functional limits for tasks assessed     SENSATION: WFL  POSTURE: Increased tone in R levator  UPPER EXTREMITY ROM: Full   UPPER EXTREMITY MMT:  MMT Right eval Left eval  Shoulder flexion 4+   Shoulder extension 4   Shoulder abduction 5   Shoulder adduction    Shoulder internal rotation 4+*   Shoulder external rotation 5   Middle trapezius    Lower trapezius    Elbow flexion    Elbow extension    Grip strength (lbs)    (Blank rows = not tested)  SHOULDER SPECIAL TESTS: Impingement tests: Neer impingement test: negative and Hawkins/Kennedy impingement test: positive  Rotator cuff assessment: Empty can test: positive , Full can test: negative, and Gerber lift off test: negative   JOINT MOBILITY TESTING:  NT  PALPATION:  Palpation: TTP at ant shoulder. Increased tissue tension in R levator                                                                                                                             TREATMENT DATE:  08/15/24   Arm bike: Level 2x 6 minutes (3/3)- PT present to discuss progress  Supine on foam roll: green theraband horizontal abduction, D2 and ER 2x10 Supine on  foam roll chest stretch with dowel x 5 10 sec hold Sidelying ER on Rt: pain with 2# 2x10 sidelying abduction 2x10 Push up off counter  x 10 Green theraband: IR/ER flexion and extension x10  Standing shoulder flexion + scaption with 4# DB x 10 each Wall clocks: blue loop 2x5 each  Doorway stretch 3x20 seconds      08/02/24   Arm bike: Level 2x 6 minutes (3/3)- PT present to discuss progress  Wall push-ups 2x10 Wall  clocks: blue loop 2x5 each  Red theraband: IR/ER flexion and extension x10 -verbal cues for scapular alignment and to avoid pain Doorway stretch 3x20 seconds  Open book in sidelying with foam roll x10 Sidelying ER on Rt: pain with 2# 2x10 sidelying abduction 2x10 Supine on foam roll: red theraband horizontal abduction, D2 and ER 2x10   07/31/24   Arm bike: Level 2x 6 minutes (3/3)- PT present to discuss progress  Red theraband: IR/ER flexion and extension x10 -verbal cues for scapular alignment and to avoid pain Doorway stretch 3x20 seconds  Open book in sidelying with foam roll x10 Sidelying ER on Rt: pain with 2#, 2x10 with no weight, sidelying abduction 2x10 Supine on foam roll: red theraband horizontal abduction, D2 and ER 2x10  07/24/24  See pt ed and HEP  If treatment provided at initial evaluation, no treatment charged due to lack of authorization.    PATIENT EDUCATION: Education details: PT eval findings, anticipated POC, initial HEP, and postural awareness   Person educated: Patient Education method: Explanation, Demonstration, Tactile cues, Verbal cues, and Handouts Education comprehension: verbalized understanding and returned demonstration  HOME EXERCISE PROGRAM: Access Code: [email or web address] URL: [email or web address] Date: 07/31/2024 Prepared by: Burnard  Exercises - Single Arm Shoulder Extension with Resistance  - 1 x daily - 3 x weekly - 1-3 sets - 10 reps - Shoulder External Rotation with Anchored Resistance  - 1 x daily - 3 x weekly - 1-3 sets - 10 reps - Shoulder Internal Rotation with Resistance  - 1 x daily - 3 x weekly - 1-3 sets - 10 reps - Standing Single Arm Shoulder Flexion with Posterior Anchored Resistance (Mirrored)  - 1 x daily - 3 x weekly - 2 sets - 10 reps - Sidelying Open Book Thoracic Rotation with Knee on Foam Roll  - 2 x daily - 7 x weekly - 1 sets - 10 reps - 5 hold - Supine Shoulder Horizontal Abduction with Resistance  - 2 x daily - 7  x weekly - 2 sets - 10 reps - Supine Bilateral Shoulder External Rotation with Resistance  - 2 x daily - 7 x weekly - 2 sets - 10 reps - Supine PNF D2 Flexion with Resistance  - 2 x daily - 7 x weekly - 2 sets - 10 reps ASSESSMENT:  CLINICAL IMPRESSION: [name] is progressing well with skilled therapy. A lot of the exercised he performed last time did not cause him as much discomfort today, and he was able to tolerate increased resistance. He is able to perform a side plank for about 4-5 seconds before he has pain. He has been working out at the gym at 75% of his normal intensity.  PT monitored patient response throughout and provided verbal cues as needed. Patient will benefit from skilled PT to address the below impairments and improve overall function.   OBJECTIVE IMPAIRMENTS: decreased activity tolerance, decreased knowledge of condition, decreased strength, hypomobility, increased fascial restrictions, increased muscle spasms, impaired flexibility, impaired UE functional  use, postural dysfunction, and pain.   ACTIVITY LIMITATIONS: lifting, sleeping, and weightbearing through shoulder  PARTICIPATION LIMITATIONS: not restricted but painful  PERSONAL FACTORS: 1-2 comorbidities: bipolar depression, acute low back pain are also affecting patient's functional outcome.   REHAB POTENTIAL: Excellent  CLINICAL DECISION MAKING: Evolving/moderate complexity  EVALUATION COMPLEXITY: Low   GOALS: Goals reviewed with patient? Yes  SHORT TERM GOALS: Target date: 08/22/24  Patient able to sleep without pain Baseline:  Goal status: INITIAL  2.  Patient will be independent with initial HEP  Baseline: 07/31/24 Goal status: In progress     LONG TERM GOALS: Target date: 09/19/24  Patient to report pain no greater than 2/10 with weightbearing and ADLs Baseline: 6-7/10 (08/02/24) Goal status: In progress   2.  Patient to be independent with advanced HEP  Baseline:  Goal status: INITIAL  3.   Patient to demonstrate 5/5 R shoulder strength to improve function.  Baseline:  Goal status: INITIAL  4.  Average PSFS score to improve by 2 points showing functional improvement. Baseline: 6.5 Goal status: INITIAL   PLAN:  PT FREQUENCY: 2x/week  PT DURATION: 8 weeks  PLANNED INTERVENTIONS: 97164- PT Re-evaluation, 97110-Therapeutic exercises, 97530- Therapeutic activity, 97112- Neuromuscular re-education, 97535- Self Care, 02859- Manual therapy, [phone]- Aquatic Therapy, [phone]- Electrical stimulation (unattended), 97016- Vasopneumatic device, [id number]- Ultrasound, [id number]- Ionotophoresis 4mg /ml Dexamethasone, 79439 (1-2 muscles), 20561 (3+ muscles)- Dry Needling, Patient/Family education, Taping, Joint mobilization, Spinal mobilization, Cryotherapy, and Moist heat  PLAN FOR NEXT SESSION: Rt shoulder and postural strengthening, give patient a green band; more overhead motions; manual/DN to R UQ, spinal mobs as needed    Kristeen Sar, PT, DPT 08/15/2024 11:53 AM   "

## 2024-08-20 ENCOUNTER — Ambulatory Visit: Payer: MEDICAID | Attending: Gastroenterology | Admitting: Rehabilitative and Restorative Service Providers"

## 2024-08-20 ENCOUNTER — Encounter: Payer: Self-pay | Admitting: Rehabilitative and Restorative Service Providers"

## 2024-08-20 DIAGNOSIS — M6281 Muscle weakness (generalized): Secondary | ICD-10-CM | POA: Diagnosis present

## 2024-08-20 DIAGNOSIS — M5459 Other low back pain: Secondary | ICD-10-CM | POA: Diagnosis present

## 2024-08-20 DIAGNOSIS — M25511 Pain in right shoulder: Secondary | ICD-10-CM | POA: Diagnosis present

## 2024-08-20 NOTE — Therapy (Signed)
 " OUTPATIENT PHYSICAL THERAPY TREATMENT   Patient Name: [name] MRN: [id number] DOB:Aug 08, 1981, 44 y.o., male Today's Date: 08/20/2024  END OF SESSION:  PT End of Session - 08/20/24 0801     Visit Number 5    Date for Recertification  09/19/24    Authorization Type [employer]-16 VISITS 07/24/24-09/19/24    Authorization - Visit Number 4    Authorization - Number of Visits 16    PT Start Time 0755    PT Stop Time 0844    PT Time Calculation (min) 49 min    Activity Tolerance Patient tolerated treatment well    Behavior During Therapy [facility] for tasks assessed/performed             Past Medical History:  Diagnosis Date   ADHD (attention deficit hyperactivity disorder)    Anxiety    Bipolar 1 disorder (HCC)    Depression    Encephalopathy acute 11/18/2015   Excessive weight loss    IBS (irritable bowel syndrome)    Insomnia    Ulcerative colitis    Past Surgical History:  Procedure Laterality Date   ANAL FISSURECTOMY     COLON SURGERY     COLONOSCOPY W/ BIOPSIES     hx ulcerative colitis   Patient Active Problem List   Diagnosis Date Noted   Financial difficulties 12/24/2017   Dental infection 10/20/2016   Nausea 05/31/2016   Rectal bleeding 05/28/2016   Borderline abnormal TFTs 04/20/2016   Other social stressor 01/28/2016   Falls 12/19/2015   Visual acuity reduced 12/19/2015   Orthostatic hypotension 11/18/2015   Encephalopathy acute 11/18/2015   Limited mobility 10/27/2015   Abdominal pain 09/12/2015   Mixed bipolar I disorder (HCC) 03/18/2015   Feeding difficulties and mismanagement 07/20/2013   Immunocompromised state (HCC) 07/20/2013   Protein-calorie malnutrition, severe 07/09/2013   EKG, abnormal 05/14/2012   ADHD (attention deficit hyperactivity disorder) 05/12/2012   Insomnia 05/06/2010   HYPERCHOLESTEROLEMIA, PURE 06/14/2008   Anxiety state 11/27/2007   Ulcerative colitis (HCC) 04/26/2001    PCP: Clerance Lonell HERO, MD   REFERRING  PROVIDER: Claretha Almarie Anon FNP   REFERRING DIAG:  M25.511 (ICD-10-CM) - Right shoulder pain  S46.011A (ICD-10-CM) - Strain of muscle(s) and tendon(s) of the rotator cuff of right shoulder, initial encounter    THERAPY DIAG:  Acute pain of right shoulder  Muscle weakness (generalized)  Other low back pain  Rationale for Evaluation and Treatment: Rehabilitation  ONSET DATE: 4-5 weeks ago  SUBJECTIVE:  SUBJECTIVE STATEMENT: Patient reports he was doing well with pain of 2-3/10, but he tried to do a new exercise (flys) at the gym and noticed increased pain later in the day, especially after yoga.  Hand dominance: Left  PERTINENT HISTORY:  acute LBP, bipolar, cholectomy  PAIN: 08/20/24 Are you having pain? Yes: NPRS scale: 5-6/10 Pain location: right shoulder Pain description: gritty feeling with side planks and full ER at 90 deg Aggravating factors: side planks and ER  Relieving factors: rest  PRECAUTIONS: None  RED FLAGS: None   WEIGHT BEARING RESTRICTIONS: No  FALLS:  Has patient fallen in last 6 months? No  LIVING ENVIRONMENT: Lives with: lives with their family Lives in: House/apartment  OCCUPATION: Disabled due to cholectomy  PLOF: Independent  PATIENT GOALS:Get rid of pain and get to previous level of function.  NEXT MD VISIT: none scheduled  OBJECTIVE:  Note: Objective measures were completed at Evaluation unless otherwise noted.  DIAGNOSTIC FINDINGS:  none  PATIENT SURVEYS:  THE PATIENT SPECIFIC FUNCTIONAL SCALE  Place score of 0-10 (0 = unable to perform activity and 10 = able to perform activity at the same level as before injury or problem)  Activity Date: 07/24/24    sleeping 7    2. Weightbearing (yoga eg) 6    3.     4.      Total Score 6.5       Total Score = Sum of activity scores/number of activities  Minimally Detectable Change: 3 points (for single activity); 2 points (for average score)  [facility] (nd). The Patient Specific Functional Scale . Retrieved from [email or web address]    COGNITION: Overall cognitive status: Within functional limits for tasks assessed     SENSATION: WFL  POSTURE: Increased tone in R levator  UPPER EXTREMITY ROM: Full   UPPER EXTREMITY MMT:  MMT Right eval Left eval  Shoulder flexion 4+   Shoulder extension 4   Shoulder abduction 5   Shoulder adduction    Shoulder internal rotation 4+*   Shoulder external rotation 5   Middle trapezius    Lower trapezius    Elbow flexion    Elbow extension    Grip strength (lbs)    (Blank rows = not tested)  SHOULDER SPECIAL TESTS: Impingement tests: Neer impingement test: negative and Hawkins/Kennedy impingement test: positive  Rotator cuff assessment: Empty can test: positive , Full can test: negative, and Gerber lift off test: negative   JOINT MOBILITY TESTING:  NT  PALPATION:  Palpation: TTP at ant shoulder. Increased tissue tension in R levator                                                                                                                             TREATMENT DATE:   08/20/2024: Arm bike: Level 2x 6 minutes (3/3)- PT present to discuss progress  Supine on foam roll: green theraband horizontal abduction, D2 and ER 2x10 Supine on foam roll:  chest stretch with hands behind head 5 x 10 sec hold Left sidelying:  right shoulder ER and abduction.  2x10 each Standing right scapular stabilization:  4D blue 2# plyoball x20, CW/CCW rotations x20 each direction Standing 3 way scapular stabilization wall clocks with blue loop 2x5 bilat Standing shoulder flexion and scaption with 3# dumbbells 2x10 each bilat Supine serratus punch with 3# dumbbell 2x10 bilat  (with increased pain on right side) Barre push-ups 2x10 Bent forward shoulder extension with 3# dumbbell 2x10 bilat Bent forward rows with 3# dumbbell x10 bilat Side-lying open books x10 Cold pack x10 min to right shoulder   08/15/24   Arm bike: Level 2x 6 minutes (3/3)- PT present to discuss progress  Supine on foam roll: green theraband horizontal abduction, D2 and ER 2x10 Supine on foam roll chest stretch with dowel x 5 10 sec hold Sidelying ER on Rt: pain with 2# 2x10 sidelying abduction 2x10 Push up off counter  x 10 Green theraband: IR/ER flexion and extension x10  Standing shoulder flexion + scaption with 4# DB x 10 each Wall clocks: blue loop 2x5 each  Doorway stretch 3x20 seconds      08/02/24   Arm bike: Level 2x 6 minutes (3/3)- PT present to discuss progress  Wall push-ups 2x10 Wall clocks: blue loop 2x5 each  Red theraband: IR/ER flexion and extension x10 -verbal cues for scapular alignment and to avoid pain Doorway stretch 3x20 seconds  Open book in sidelying with foam roll x10 Sidelying ER on Rt: pain with 2# 2x10 sidelying abduction 2x10 Supine on foam roll: red theraband horizontal abduction, D2 and ER 2x10    PATIENT EDUCATION: Education details: PT eval findings, anticipated POC, initial HEP, and postural awareness   Person educated: Patient Education method: Explanation, Demonstration, Tactile cues, Verbal cues, and Handouts Education comprehension: verbalized understanding and returned demonstration  HOME EXERCISE PROGRAM: Access Code: [email or web address] URL: [email or web address] Date: 07/31/2024 Prepared by: Burnard  Exercises - Single Arm Shoulder Extension with Resistance  - 1 x daily - 3 x weekly - 1-3 sets - 10 reps - Shoulder External Rotation with Anchored Resistance  - 1 x daily - 3 x weekly - 1-3 sets - 10 reps - Shoulder Internal Rotation with Resistance  - 1 x daily - 3 x weekly - 1-3 sets - 10 reps - Standing Single Arm Shoulder  Flexion with Posterior Anchored Resistance (Mirrored)  - 1 x daily - 3 x weekly - 2 sets - 10 reps - Sidelying Open Book Thoracic Rotation with Knee on Foam Roll  - 2 x daily - 7 x weekly - 1 sets - 10 reps - 5 hold - Supine Shoulder Horizontal Abduction with Resistance  - 2 x daily - 7 x weekly - 2 sets - 10 reps - Supine Bilateral Shoulder External Rotation with Resistance  - 2 x daily - 7 x weekly - 2 sets - 10 reps - Supine PNF D2 Flexion with Resistance  - 2 x daily - 7 x weekly - 2 sets - 10 reps  ASSESSMENT:  CLINICAL IMPRESSION:  Shondale presents to skilled PT with increased pain today, but states that he added a new shoulder exercise at the gym (flys) and that he feels is when the increased pain began.  Patient was able to progress through session, but did have some increased pain with some exercises (such as serratus punches).  Patient went down on the weight for some of the exercises secondary to increased pain from the  weekend.  Patient requires some cuing during session for improved for and slower pacing.  Patient elected for a cold pack at the end of the session secondary to overall increased pain today.  Patient continues to require skilled PT to progress towards goal related activities.   OBJECTIVE IMPAIRMENTS: decreased activity tolerance, decreased knowledge of condition, decreased strength, hypomobility, increased fascial restrictions, increased muscle spasms, impaired flexibility, impaired UE functional use, postural dysfunction, and pain.   ACTIVITY LIMITATIONS: lifting, sleeping, and weightbearing through shoulder  PARTICIPATION LIMITATIONS: not restricted but painful  PERSONAL FACTORS: 1-2 comorbidities: bipolar depression, acute low back pain are also affecting patient's functional outcome.   REHAB POTENTIAL: Excellent  CLINICAL DECISION MAKING: Evolving/moderate complexity  EVALUATION COMPLEXITY: Low   GOALS: Goals reviewed with patient? Yes  SHORT TERM GOALS:  Target date: 08/22/24  Patient able to sleep without pain Baseline:  Goal status: Ongoing (still hurts when he lies on his right side as of 08/20/24)  2.  Patient will be independent with initial HEP  Baseline: 07/31/24 Goal status: Met on 08/20/24    LONG TERM GOALS: Target date: 09/19/24  Patient to report pain no greater than 2/10 with weightbearing and ADLs Baseline: 6-7/10 (08/02/24) Goal status: In progress   2.  Patient to be independent with advanced HEP  Baseline:  Goal status: INITIAL  3.  Patient to demonstrate 5/5 R shoulder strength to improve function.  Baseline:  Goal status: INITIAL  4.  Average PSFS score to improve by 2 points showing functional improvement. Baseline: 6.5 Goal status: INITIAL   PLAN:  PT FREQUENCY: 2x/week  PT DURATION: 8 weeks  PLANNED INTERVENTIONS: 97164- PT Re-evaluation, 97110-Therapeutic exercises, 97530- Therapeutic activity, 97112- Neuromuscular re-education, 97535- Self Care, 02859- Manual therapy, [phone]- Aquatic Therapy, [id number]- Electrical stimulation (unattended), 97016- Vasopneumatic device, [id number]- Ultrasound, [id number]- Ionotophoresis 4mg /ml Dexamethasone, 79439 (1-2 muscles), 20561 (3+ muscles)- Dry Needling, Patient/Family education, Taping, Joint mobilization, Spinal mobilization, Cryotherapy, and Moist heat  PLAN FOR NEXT SESSION: Rt shoulder and postural strengthening, give patient a green band; more overhead motions; manual/DN to R UQ, spinal mobs as needed    Jarrell Laming, PT, DPT 08/20/2024, 8:42 AM  [facility] [address] [place], [place] [address] Phone # [phone] Fax [phone]   "

## 2024-08-23 ENCOUNTER — Encounter: Payer: Self-pay | Admitting: Rehabilitative and Restorative Service Providers"

## 2024-08-23 ENCOUNTER — Ambulatory Visit: Payer: MEDICAID | Admitting: Rehabilitative and Restorative Service Providers"

## 2024-08-23 DIAGNOSIS — M6281 Muscle weakness (generalized): Secondary | ICD-10-CM

## 2024-08-23 DIAGNOSIS — M25511 Pain in right shoulder: Secondary | ICD-10-CM

## 2024-08-23 DIAGNOSIS — M5459 Other low back pain: Secondary | ICD-10-CM

## 2024-08-23 NOTE — Therapy (Signed)
 " OUTPATIENT PHYSICAL THERAPY TREATMENT NOTE   Patient Name: [name] MRN: [id number] DOB:Aug 05, 1981, 44 y.o., male Today's Date: 08/23/2024  END OF SESSION:  PT End of Session - 08/23/24 0807     Visit Number 6    Date for Recertification  09/19/24    Authorization Type [employer]-16 VISITS 07/24/24-09/19/24    Authorization - Visit Number 5    Authorization - Number of Visits 16    PT Start Time 0800    PT Stop Time 0850    PT Time Calculation (min) 50 min    Activity Tolerance Patient tolerated treatment well    Behavior During Therapy [facility] for tasks assessed/performed             Past Medical History:  Diagnosis Date   ADHD (attention deficit hyperactivity disorder)    Anxiety    Bipolar 1 disorder (HCC)    Depression    Encephalopathy acute 11/18/2015   Excessive weight loss    IBS (irritable bowel syndrome)    Insomnia    Ulcerative colitis    Past Surgical History:  Procedure Laterality Date   ANAL FISSURECTOMY     COLON SURGERY     COLONOSCOPY W/ BIOPSIES     hx ulcerative colitis   Patient Active Problem List   Diagnosis Date Noted   Financial difficulties 12/24/2017   Dental infection 10/20/2016   Nausea 05/31/2016   Rectal bleeding 05/28/2016   Borderline abnormal TFTs 04/20/2016   Other social stressor 01/28/2016   Falls 12/19/2015   Visual acuity reduced 12/19/2015   Orthostatic hypotension 11/18/2015   Encephalopathy acute 11/18/2015   Limited mobility 10/27/2015   Abdominal pain 09/12/2015   Mixed bipolar I disorder (HCC) 03/18/2015   Feeding difficulties and mismanagement 07/20/2013   Immunocompromised state (HCC) 07/20/2013   Protein-calorie malnutrition, severe 07/09/2013   EKG, abnormal 05/14/2012   ADHD (attention deficit hyperactivity disorder) 05/12/2012   Insomnia 05/06/2010   HYPERCHOLESTEROLEMIA, PURE 06/14/2008   Anxiety state 11/27/2007   Ulcerative colitis (HCC) 04/26/2001    PCP: Clerance Lonell HERO,  MD   REFERRING PROVIDER: Claretha Almarie Anon FNP   REFERRING DIAG:  M25.511 (ICD-10-CM) - Right shoulder pain  S46.011A (ICD-10-CM) - Strain of muscle(s) and tendon(s) of the rotator cuff of right shoulder, initial encounter    THERAPY DIAG:  Acute pain of right shoulder  Muscle weakness (generalized)  Other low back pain  Rationale for Evaluation and Treatment: Rehabilitation  ONSET DATE: 4-5 weeks ago  SUBJECTIVE:  SUBJECTIVE STATEMENT: Patient reports he felt okay after last PT session.  Hand dominance: Left  PERTINENT HISTORY:  acute LBP, bipolar, cholectomy  PAIN: 08/23/24 Are you having pain? Yes: NPRS scale: 3-4/10 Pain location: right shoulder Pain description: gritty feeling with side planks and full ER at 90 deg Aggravating factors: side planks and ER  Relieving factors: rest  PRECAUTIONS: None  RED FLAGS: None   WEIGHT BEARING RESTRICTIONS: No  FALLS:  Has patient fallen in last 6 months? No  LIVING ENVIRONMENT: Lives with: lives with their family Lives in: House/apartment  OCCUPATION: Disabled due to cholectomy  PLOF: Independent  PATIENT GOALS:Get rid of pain and get to previous level of function.  NEXT MD VISIT: none scheduled  OBJECTIVE:  Note: Objective measures were completed at Evaluation unless otherwise noted.  DIAGNOSTIC FINDINGS:  none  PATIENT SURVEYS:  THE PATIENT SPECIFIC FUNCTIONAL SCALE  Place score of 0-10 (0 = unable to perform activity and 10 = able to perform activity at the same level as before injury or problem)  Activity Date: 07/24/24    sleeping 7    2. Weightbearing (yoga eg) 6    3.     4.      Total Score 6.5      Total Score = Sum of activity scores/number of activities  Minimally Detectable Change: 3 points (for  single activity); 2 points (for average score)  [facility] (nd). The Patient Specific Functional Scale . Retrieved from [email or web address]    COGNITION: Overall cognitive status: Within functional limits for tasks assessed     SENSATION: WFL  POSTURE: Increased tone in R levator  UPPER EXTREMITY ROM: Full   UPPER EXTREMITY MMT:  MMT Right eval Left eval  Shoulder flexion 4+   Shoulder extension 4   Shoulder abduction 5   Shoulder adduction    Shoulder internal rotation 4+*   Shoulder external rotation 5   Middle trapezius    Lower trapezius    Elbow flexion    Elbow extension    Grip strength (lbs)    (Blank rows = not tested)  SHOULDER SPECIAL TESTS: Impingement tests: Neer impingement test: negative and Hawkins/Kennedy impingement test: positive  Rotator cuff assessment: Empty can test: positive , Full can test: negative, and Gerber lift off test: negative   JOINT MOBILITY TESTING:  NT  PALPATION:  Palpation: TTP at ant shoulder. Increased tissue tension in R levator                                                                                                                             TREATMENT DATE:   08/23/2024: Arm bike: Level 2x 6 minutes (3/3)- PT present to discuss progress  Supine on foam roll: green theraband horizontal abduction, D2 and ER 2x10 Supine on foam roll:  chest stretch with hands behind head 5 x 5 sec hold Left sidelying:  right shoulder ER and abduction.  2x10 each Standing  right scapular stabilization:  4D blue 2# plyoball x20, CW/CCW rotations x20 each direction Standing 3 way scapular stabilization wall clocks with blue loop 2x5 bilat Supine serratus punch with 3# dumbbell 2x10 bilat (with increased pain on right side) Prone rows with 3# dumbbells 2x10 right Prone shoulder extension with 3# dumbbells 2x10 right Side-lying open books 2x10 Quadruped bird dog  2x10 Childs pose x10 sec Cold pack x10 min to right shoulder   08/20/2024: Arm bike: Level 2x 6 minutes (3/3)- PT present to discuss progress  Supine on foam roll: green theraband horizontal abduction, D2 and ER 2x10 Supine on foam roll:  chest stretch with hands behind head 5 x 10 sec hold Left sidelying:  right shoulder ER and abduction.  2x10 each Standing right scapular stabilization:  4D blue 2# plyoball x20, CW/CCW rotations x20 each direction Standing 3 way scapular stabilization wall clocks with blue loop 2x5 bilat Standing shoulder flexion and scaption with 3# dumbbells 2x10 each bilat Supine serratus punch with 3# dumbbell 2x10 bilat (with increased pain on right side) Barre push-ups 2x10 Bent forward shoulder extension with 3# dumbbell 2x10 bilat Bent forward rows with 3# dumbbell x10 bilat Side-lying open books x10 Cold pack x10 min to right shoulder   08/15/24   Arm bike: Level 2x 6 minutes (3/3)- PT present to discuss progress  Supine on foam roll: green theraband horizontal abduction, D2 and ER 2x10 Supine on foam roll chest stretch with dowel x 5 10 sec hold Sidelying ER on Rt: pain with 2# 2x10 sidelying abduction 2x10 Push up off counter  x 10 Green theraband: IR/ER flexion and extension x10  Standing shoulder flexion + scaption with 4# DB x 10 each Wall clocks: blue loop 2x5 each  Doorway stretch 3x20 seconds     PATIENT EDUCATION: Education details: PT eval findings, anticipated POC, initial HEP, and postural awareness   Person educated: Patient Education method: Explanation, Demonstration, Tactile cues, Verbal cues, and Handouts Education comprehension: verbalized understanding and returned demonstration  HOME EXERCISE PROGRAM: Access Code: [email or web address] URL: [email or web address] Date: 07/31/2024 Prepared by: Burnard  Exercises - Single Arm Shoulder Extension with Resistance  - 1 x daily - 3 x weekly - 1-3 sets - 10 reps - Shoulder External  Rotation with Anchored Resistance  - 1 x daily - 3 x weekly - 1-3 sets - 10 reps - Shoulder Internal Rotation with Resistance  - 1 x daily - 3 x weekly - 1-3 sets - 10 reps - Standing Single Arm Shoulder Flexion with Posterior Anchored Resistance (Mirrored)  - 1 x daily - 3 x weekly - 2 sets - 10 reps - Sidelying Open Book Thoracic Rotation with Knee on Foam Roll  - 2 x daily - 7 x weekly - 1 sets - 10 reps - 5 hold - Supine Shoulder Horizontal Abduction with Resistance  - 2 x daily - 7 x weekly - 2 sets - 10 reps - Supine Bilateral Shoulder External Rotation with Resistance  - 2 x daily - 7 x weekly - 2 sets - 10 reps - Supine PNF D2 Flexion with Resistance  - 2 x daily - 7 x weekly - 2 sets - 10 reps  ASSESSMENT:  CLINICAL IMPRESSION:  Rontavious presents to skilled PT reporting that he is still having some shoulder pain from where he injured it at the gym, but it is feeling better than earlier this week.  Patient continues to progress with similar session to last time, as he is still  having some pain with various exercises, especially serratus punch.  Patient required cuing for core engagement during bird dog, but was able to perform with minimal discomfort.  Patient continues to require skilled PT to progress towards goal related activities.   OBJECTIVE IMPAIRMENTS: decreased activity tolerance, decreased knowledge of condition, decreased strength, hypomobility, increased fascial restrictions, increased muscle spasms, impaired flexibility, impaired UE functional use, postural dysfunction, and pain.   ACTIVITY LIMITATIONS: lifting, sleeping, and weightbearing through shoulder  PARTICIPATION LIMITATIONS: not restricted but painful  PERSONAL FACTORS: 1-2 comorbidities: bipolar depression, acute low back pain are also affecting patient's functional outcome.   REHAB POTENTIAL: Excellent  CLINICAL DECISION MAKING: Evolving/moderate complexity  EVALUATION COMPLEXITY: Low   GOALS: Goals  reviewed with patient? Yes  SHORT TERM GOALS: Target date: 08/22/24  Patient able to sleep without pain Baseline:  Goal status: Ongoing (still hurts when he lies on his right side as of 08/20/24)  2.  Patient will be independent with initial HEP  Baseline: 07/31/24 Goal status: Met on 08/20/24    LONG TERM GOALS: Target date: 09/19/24  Patient to report pain no greater than 2/10 with weightbearing and ADLs Baseline: 6-7/10 (08/02/24) Goal status: In progress   2.  Patient to be independent with advanced HEP  Baseline:  Goal status: INITIAL  3.  Patient to demonstrate 5/5 R shoulder strength to improve function.  Baseline:  Goal status: INITIAL  4.  Average PSFS score to improve by 2 points showing functional improvement. Baseline: 6.5 Goal status: INITIAL   PLAN:  PT FREQUENCY: 2x/week  PT DURATION: 8 weeks  PLANNED INTERVENTIONS: 97164- PT Re-evaluation, 97110-Therapeutic exercises, 97530- Therapeutic activity, 97112- Neuromuscular re-education, 97535- Self Care, 02859- Manual therapy, [phone]- Aquatic Therapy, [phone]- Electrical stimulation (unattended), 97016- Vasopneumatic device, [id number]- Ultrasound, [id number]- Ionotophoresis 4mg /ml Dexamethasone, 79439 (1-2 muscles), 20561 (3+ muscles)- Dry Needling, Patient/Family education, Taping, Joint mobilization, Spinal mobilization, Cryotherapy, and Moist heat  PLAN FOR NEXT SESSION: Rt shoulder and postural strengthening, give patient a green band; more overhead motions; manual/DN to R UQ, spinal mobs as needed    Jarrell Laming, PT, DPT 08/23/2024, 8:46 AM  [facility] [address] [place], [place] [address] Phone # [phone] Fax [phone]   "

## 2024-08-28 ENCOUNTER — Ambulatory Visit: Payer: MEDICAID | Admitting: Rehabilitative and Restorative Service Providers"

## 2024-08-28 ENCOUNTER — Encounter: Payer: Self-pay | Admitting: Rehabilitative and Restorative Service Providers"

## 2024-08-28 DIAGNOSIS — M5459 Other low back pain: Secondary | ICD-10-CM

## 2024-08-28 DIAGNOSIS — M25511 Pain in right shoulder: Secondary | ICD-10-CM

## 2024-08-28 DIAGNOSIS — M6281 Muscle weakness (generalized): Secondary | ICD-10-CM

## 2024-08-28 NOTE — Therapy (Signed)
 " OUTPATIENT PHYSICAL THERAPY TREATMENT NOTE   Patient Name: [name] MRN: [id number] DOB:1981-06-12, 44 y.o., male Today's Date: 08/28/2024  END OF SESSION:  PT End of Session - 08/28/24 0804     Visit Number 7    Date for Recertification  09/19/24    Authorization Type [employer]-16 VISITS 07/24/24-09/19/24    Authorization - Visit Number 6    Authorization - Number of Visits 16    PT Start Time 0800    PT Stop Time 0850    PT Time Calculation (min) 50 min    Activity Tolerance Patient tolerated treatment well    Behavior During Therapy [facility] for tasks assessed/performed             Past Medical History:  Diagnosis Date   ADHD (attention deficit hyperactivity disorder)    Anxiety    Bipolar 1 disorder (HCC)    Depression    Encephalopathy acute 11/18/2015   Excessive weight loss    IBS (irritable bowel syndrome)    Insomnia    Ulcerative colitis    Past Surgical History:  Procedure Laterality Date   ANAL FISSURECTOMY     COLON SURGERY     COLONOSCOPY W/ BIOPSIES     hx ulcerative colitis   Patient Active Problem List   Diagnosis Date Noted   Financial difficulties 12/24/2017   Dental infection 10/20/2016   Nausea 05/31/2016   Rectal bleeding 05/28/2016   Borderline abnormal TFTs 04/20/2016   Other social stressor 01/28/2016   Falls 12/19/2015   Visual acuity reduced 12/19/2015   Orthostatic hypotension 11/18/2015   Encephalopathy acute 11/18/2015   Limited mobility 10/27/2015   Abdominal pain 09/12/2015   Mixed bipolar I disorder (HCC) 03/18/2015   Feeding difficulties and mismanagement 07/20/2013   Immunocompromised state (HCC) 07/20/2013   Protein-calorie malnutrition, severe 07/09/2013   EKG, abnormal 05/14/2012   ADHD (attention deficit hyperactivity disorder) 05/12/2012   Insomnia 05/06/2010   HYPERCHOLESTEROLEMIA, PURE 06/14/2008   Anxiety state 11/27/2007   Ulcerative colitis (HCC) 04/26/2001    PCP: Clerance Lonell HERO,  MD   REFERRING PROVIDER: Claretha Almarie Anon FNP   REFERRING DIAG:  M25.511 (ICD-10-CM) - Right shoulder pain  S46.011A (ICD-10-CM) - Strain of muscle(s) and tendon(s) of the rotator cuff of right shoulder, initial encounter    THERAPY DIAG:  Acute pain of right shoulder  Muscle weakness (generalized)  Other low back pain  Rationale for Evaluation and Treatment: Rehabilitation  ONSET DATE: 4-5 weeks ago  SUBJECTIVE:  SUBJECTIVE STATEMENT: Patient reports he is having shoulder pain again today.  States that his shoulder has not really gotten better since he injured it at the gym doing flys.  Hand dominance: Left  PERTINENT HISTORY:  acute LBP, bipolar, cholectomy  PAIN: 08/28/24 Are you having pain? Yes: NPRS scale: 5-6/10 Pain location: right shoulder Pain description: gritty feeling with side planks and full ER at 90 deg Aggravating factors: side planks and ER  Relieving factors: rest  PRECAUTIONS: None  RED FLAGS: None   WEIGHT BEARING RESTRICTIONS: No  FALLS:  Has patient fallen in last 6 months? No  LIVING ENVIRONMENT: Lives with: lives with their family Lives in: House/apartment  OCCUPATION: Disabled due to cholectomy  PLOF: Independent  PATIENT GOALS:Get rid of pain and get to previous level of function.  NEXT MD VISIT: none scheduled  OBJECTIVE:  Note: Objective measures were completed at Evaluation unless otherwise noted.  DIAGNOSTIC FINDINGS:  none  PATIENT SURVEYS:  THE PATIENT SPECIFIC FUNCTIONAL SCALE  Place score of 0-10 (0 = unable to perform activity and 10 = able to perform activity at the same level as before injury or problem)  Activity Date: 07/24/24    sleeping 7    2. Weightbearing (yoga eg) 6    3.     4.      Total Score 6.5       Total Score = Sum of activity scores/number of activities  Minimally Detectable Change: 3 points (for single activity); 2 points (for average score)  [facility] (nd). The Patient Specific Functional Scale . Retrieved from Skateoasis.com.pt    COGNITION: Overall cognitive status: Within functional limits for tasks assessed     SENSATION: WFL  POSTURE: Increased tone in R levator  UPPER EXTREMITY ROM: Full   UPPER EXTREMITY MMT:  MMT Right eval Left eval  Shoulder flexion 4+   Shoulder extension 4   Shoulder abduction 5   Shoulder adduction    Shoulder internal rotation 4+*   Shoulder external rotation 5   Middle trapezius    Lower trapezius    Elbow flexion    Elbow extension    Grip strength (lbs)    (Blank rows = not tested)  SHOULDER SPECIAL TESTS: Impingement tests: Neer impingement test: negative and Hawkins/Kennedy impingement test: positive  Rotator cuff assessment: Empty can test: positive , Full can test: negative, and Gerber lift off test: negative   JOINT MOBILITY TESTING:  NT  PALPATION:  Palpation: TTP at ant shoulder. Increased tissue tension in R levator                                                                                                                             TREATMENT DATE:   08/28/2024: Arm bike: Level 1.5 x 6 minutes (3/3)- PT present to discuss progress Standing scapular stabilization:  4D blue 2# plyoball x20, CW/CCW rotations x20 each direction bilat Standing 3 way scapular stabilization wall  clocks with blue loop 2x5 bilat Standing lower trap lift offs 2x10 Standing lat pull-down 25# 2x5 Standing rows 10# cable pulley 2x10 Supine on foam roll: red theraband horizontal abduction, D2 and ER 2x10 Side-lying open books 2x10 bilat Supine serratus punch with 2# dumbbell 2x10 bilat  Supine shoulder press (scapular retraction) 2x10 Supine cervical  retraction 2x10 Supine arm tracing with 2# dumbbell in hand of alphabet A-Z bilat Cold pack x10 min to right shoulder   08/23/2024: Arm bike: Level 2x 6 minutes (3/3)- PT present to discuss progress  Supine on foam roll: green theraband horizontal abduction, D2 and ER 2x10 Supine on foam roll:  chest stretch with hands behind head 5 x 5 sec hold Left sidelying:  right shoulder ER and abduction.  2x10 each Standing right scapular stabilization:  4D blue 2# plyoball x20, CW/CCW rotations x20 each direction Standing 3 way scapular stabilization wall clocks with blue loop 2x5 bilat Supine serratus punch with 3# dumbbell 2x10 bilat (with increased pain on right side) Prone rows with 3# dumbbells 2x10 right Prone shoulder extension with 3# dumbbells 2x10 right Side-lying open books 2x10 Quadruped bird dog 2x10 Childs pose x10 sec Cold pack x10 min to right shoulder   08/20/2024: Arm bike: Level 2x 6 minutes (3/3)- PT present to discuss progress  Supine on foam roll: green theraband horizontal abduction, D2 and ER 2x10 Supine on foam roll:  chest stretch with hands behind head 5 x 10 sec hold Left sidelying:  right shoulder ER and abduction.  2x10 each Standing right scapular stabilization:  4D blue 2# plyoball x20, CW/CCW rotations x20 each direction Standing 3 way scapular stabilization wall clocks with blue loop 2x5 bilat Standing shoulder flexion and scaption with 3# dumbbells 2x10 each bilat Supine serratus punch with 3# dumbbell 2x10 bilat (with increased pain on right side) Barre push-ups 2x10 Bent forward shoulder extension with 3# dumbbell 2x10 bilat Bent forward rows with 3# dumbbell x10 bilat Side-lying open books x10 Cold pack x10 min to right shoulder    PATIENT EDUCATION: Education details: PT eval findings, anticipated POC, initial HEP, and postural awareness   Person educated: Patient Education method: Explanation, Demonstration, Tactile cues, Verbal cues, and  Handouts Education comprehension: verbalized understanding and returned demonstration  HOME EXERCISE PROGRAM: Access Code: [email or web address] URL: [email or web address] Date: 07/31/2024 Prepared by: Burnard  Exercises - Single Arm Shoulder Extension with Resistance  - 1 x daily - 3 x weekly - 1-3 sets - 10 reps - Shoulder External Rotation with Anchored Resistance  - 1 x daily - 3 x weekly - 1-3 sets - 10 reps - Shoulder Internal Rotation with Resistance  - 1 x daily - 3 x weekly - 1-3 sets - 10 reps - Standing Single Arm Shoulder Flexion with Posterior Anchored Resistance (Mirrored)  - 1 x daily - 3 x weekly - 2 sets - 10 reps - Sidelying Open Book Thoracic Rotation with Knee on Foam Roll  - 2 x daily - 7 x weekly - 1 sets - 10 reps - 5 hold - Supine Shoulder Horizontal Abduction with Resistance  - 2 x daily - 7 x weekly - 2 sets - 10 reps - Supine Bilateral Shoulder External Rotation with Resistance  - 2 x daily - 7 x weekly - 2 sets - 10 reps - Supine PNF D2 Flexion with Resistance  - 2 x daily - 7 x weekly - 2 sets - 10 reps  ASSESSMENT:  CLINICAL IMPRESSION:  Dakin presents to skilled  PT reporting that he is still having some shoulder pain from where he injured it at the gym.  This PT recommended to patient to reach out to his MD for follow up given that the pain has lasted for more than a week, he verbalizes understanding.  During session, decreased the resistance of various exercises to attempt to decrease the pain some.  Patient did report less discomfort with the lighter resistance and only performing the motion within his pain-minimized range.  Patient continues to require skilled PT to progress towards goal related activities.   OBJECTIVE IMPAIRMENTS: decreased activity tolerance, decreased knowledge of condition, decreased strength, hypomobility, increased fascial restrictions, increased muscle spasms, impaired flexibility, impaired UE functional use, postural dysfunction,  and pain.   ACTIVITY LIMITATIONS: lifting, sleeping, and weightbearing through shoulder  PARTICIPATION LIMITATIONS: not restricted but painful  PERSONAL FACTORS: 1-2 comorbidities: bipolar depression, acute low back pain are also affecting patient's functional outcome.   REHAB POTENTIAL: Excellent  CLINICAL DECISION MAKING: Evolving/moderate complexity  EVALUATION COMPLEXITY: Low   GOALS: Goals reviewed with patient? Yes  SHORT TERM GOALS: Target date: 08/22/24  Patient able to sleep without pain Baseline:  Goal status: Ongoing (still hurts when he lies on his right side as of 08/20/24)  2.  Patient will be independent with initial HEP  Baseline: 07/31/24 Goal status: Met on 08/20/24    LONG TERM GOALS: Target date: 09/19/24  Patient to report pain no greater than 2/10 with weightbearing and ADLs Baseline: 6-7/10 (08/02/24) Goal status: In progress   2.  Patient to be independent with advanced HEP  Baseline:  Goal status: Ongoing  3.  Patient to demonstrate 5/5 R shoulder strength to improve function.  Baseline:  Goal status: INITIAL  4.  Average PSFS score to improve by 2 points showing functional improvement. Baseline: 6.5 Goal status: INITIAL   PLAN:  PT FREQUENCY: 2x/week  PT DURATION: 8 weeks  PLANNED INTERVENTIONS: 97164- PT Re-evaluation, 97110-Therapeutic exercises, 97530- Therapeutic activity, 97112- Neuromuscular re-education, 97535- Self Care, 02859- Manual therapy, [phone]- Aquatic Therapy, [phone]- Electrical stimulation (unattended), 97016- Vasopneumatic device, [id number]- Ultrasound, [id number]- Ionotophoresis 4mg /ml Dexamethasone, 79439 (1-2 muscles), 20561 (3+ muscles)- Dry Needling, Patient/Family education, Taping, Joint mobilization, Spinal mobilization, Cryotherapy, and Moist heat  PLAN FOR NEXT SESSION: Rt shoulder and postural strengthening, give patient a green band; more overhead motions; manual/DN to R UQ, spinal mobs as needed    Jarrell Laming, PT,  DPT 08/28/2024, 9:08 AM  [facility] [address] [place], [place] [address] Phone # [phone] Fax [phone]   "

## 2024-08-30 ENCOUNTER — Ambulatory Visit: Payer: MEDICAID | Admitting: Physical Therapy

## 2024-08-30 ENCOUNTER — Encounter: Payer: Self-pay | Admitting: Physical Therapy

## 2024-08-30 DIAGNOSIS — M6281 Muscle weakness (generalized): Secondary | ICD-10-CM

## 2024-08-30 DIAGNOSIS — M25511 Pain in right shoulder: Secondary | ICD-10-CM | POA: Diagnosis not present

## 2024-08-30 DIAGNOSIS — M5459 Other low back pain: Secondary | ICD-10-CM

## 2024-08-30 NOTE — Therapy (Signed)
 " OUTPATIENT PHYSICAL THERAPY TREATMENT NOTE   Patient Name: [name] MRN: [id number] DOB:May 13, 1981, 44 y.o., male Today's Date: 08/30/2024  END OF SESSION:  PT End of Session - 08/30/24 0758     Visit Number 8    Date for Recertification  09/19/24    Authorization Type [employer]-16 VISITS 07/24/24-09/19/24    Authorization - Visit Number 7    Authorization - Number of Visits 16    PT Start Time 0800    PT Stop Time 0840    PT Time Calculation (min) 40 min    Activity Tolerance Patient tolerated treatment well             Past Medical History:  Diagnosis Date   ADHD (attention deficit hyperactivity disorder)    Anxiety    Bipolar 1 disorder (HCC)    Depression    Encephalopathy acute 11/18/2015   Excessive weight loss    IBS (irritable bowel syndrome)    Insomnia    Ulcerative colitis    Past Surgical History:  Procedure Laterality Date   ANAL FISSURECTOMY     COLON SURGERY     COLONOSCOPY W/ BIOPSIES     hx ulcerative colitis   Patient Active Problem List   Diagnosis Date Noted   Financial difficulties 12/24/2017   Dental infection 10/20/2016   Nausea 05/31/2016   Rectal bleeding 05/28/2016   Borderline abnormal TFTs 04/20/2016   Other social stressor 01/28/2016   Falls 12/19/2015   Visual acuity reduced 12/19/2015   Orthostatic hypotension 11/18/2015   Encephalopathy acute 11/18/2015   Limited mobility 10/27/2015   Abdominal pain 09/12/2015   Mixed bipolar I disorder (HCC) 03/18/2015   Feeding difficulties and mismanagement 07/20/2013   Immunocompromised state (HCC) 07/20/2013   Protein-calorie malnutrition, severe 07/09/2013   EKG, abnormal 05/14/2012   ADHD (attention deficit hyperactivity disorder) 05/12/2012   Insomnia 05/06/2010   HYPERCHOLESTEROLEMIA, PURE 06/14/2008   Anxiety state 11/27/2007   Ulcerative colitis (HCC) 04/26/2001    PCP: Clerance Lonell HERO, MD   REFERRING PROVIDER: Claretha Almarie Anon FNP   REFERRING  DIAG:  M25.511 (ICD-10-CM) - Right shoulder pain  S46.011A (ICD-10-CM) - Strain of muscle(s) and tendon(s) of the rotator cuff of right shoulder, initial encounter    THERAPY DIAG:  Acute pain of right shoulder  Muscle weakness (generalized)  Other low back pain  Rationale for Evaluation and Treatment: Rehabilitation  ONSET DATE: 4-5 weeks ago  SUBJECTIVE:  SUBJECTIVE STATEMENT: My shoulder is a little worse from weight lifting chest press, rows then yoga afterwards.  Sees MD tomorrow. Felt it in bed last night.  My back is doing OK Goes by [employer] dominance: Left  PERTINENT HISTORY:  acute LBP, bipolar, cholectomy  PAIN: 08/30/24 Are you having pain? Yes: NPRS scale: 7/10 Pain location: right shoulder lateral subacromial  Pain description: gritty feeling with side planks and full ER at 90 deg Aggravating factors: side planks and ER  Relieving factors: rest  PRECAUTIONS: None  RED FLAGS: None   WEIGHT BEARING RESTRICTIONS: No  FALLS:  Has patient fallen in last 6 months? No  LIVING ENVIRONMENT: Lives with: lives with their family Lives in: House/apartment  OCCUPATION: Disabled due to cholectomy  PLOF: Independent  PATIENT GOALS:Get rid of pain and get to previous level of function.  NEXT MD VISIT: none scheduled  OBJECTIVE:  Note: Objective measures were completed at Evaluation unless otherwise noted.  DIAGNOSTIC FINDINGS:  none  PATIENT SURVEYS:  THE PATIENT SPECIFIC FUNCTIONAL SCALE  Place score of 0-10 (0 = unable to perform activity and 10 = able to perform activity at the same level as before injury or problem)  Activity Date: 07/24/24    sleeping 7    2. Weightbearing (yoga eg) 6    3.     4.      Total Score 6.5      Total Score = Sum of activity  scores/number of activities  Minimally Detectable Change: 3 points (for single activity); 2 points (for average score)  [facility] (nd). The Patient Specific Functional Scale . Retrieved from [employer].com.[email or web address]    COGNITION: Overall cognitive status: Within functional limits for tasks assessed     SENSATION: WFL  POSTURE: Increased tone in R levator  UPPER EXTREMITY ROM: Full   UPPER EXTREMITY MMT:  MMT Right eval Left eval  Shoulder flexion 4+   Shoulder extension 4   Shoulder abduction 5   Shoulder adduction    Shoulder internal rotation 4+*   Shoulder external rotation 5   Middle trapezius    Lower trapezius    Elbow flexion    Elbow extension    Grip strength (lbs)    (Blank rows = not tested)  SHOULDER SPECIAL TESTS: Impingement tests: Neer impingement test: negative and Hawkins/Kennedy impingement test: positive  Rotator cuff assessment: Empty can test: positive , Full can test: negative, and Gerber lift off test: negative   JOINT MOBILITY TESTING:  NT  PALPATION:  Palpation: TTP at ant shoulder. Increased tissue tension in R levator                                                                                                                             TREATMENT DATE:  08/30/2024: Status update including gym modifications avoidance of shoulder hyperextension KT: 3 strips :vertical strips anterior deltoid and posterior deltoid; 1 horizontal strip distal deltoids (instructed in removal  before ortho appt tomorrow) Standing 3 way scapular stabilization wall clocks with blue loop x5 bilat Standing blue loop wall scoops 5x Standing lat pull-down 25# 5x cues to avoid hyperextension Seated landmine press out with red power cord small range of motion for minimal pain 10x right/left Side step green band isometric internal and external rotation 5x each Flexion isometric with ball on wall 30%  submax effort 5x2  08/28/2024: Arm bike: Level 1.5 x 6 minutes (3/3)- PT present to discuss progress Standing scapular stabilization:  4D blue 2# plyoball x20, CW/CCW rotations x20 each direction bilat Standing 3 way scapular stabilization wall clocks with blue loop 2x5 bilat Standing lower trap lift offs 2x10 Standing lat pull-down 25# 2x5 Standing rows 10# cable pulley 2x10 Supine on foam roll: red theraband horizontal abduction, D2 and ER 2x10 Side-lying open books 2x10 bilat Supine serratus punch with 2# dumbbell 2x10 bilat  Supine shoulder press (scapular retraction) 2x10 Supine cervical retraction 2x10 Supine arm tracing with 2# dumbbell in hand of alphabet A-Z bilat Cold pack x10 min to right shoulder   08/23/2024: Arm bike: Level 2x 6 minutes (3/3)- PT present to discuss progress  Supine on foam roll: green theraband horizontal abduction, D2 and ER 2x10 Supine on foam roll:  chest stretch with hands behind head 5 x 5 sec hold Left sidelying:  right shoulder ER and abduction.  2x10 each Standing right scapular stabilization:  4D blue 2# plyoball x20, CW/CCW rotations x20 each direction Standing 3 way scapular stabilization wall clocks with blue loop 2x5 bilat Supine serratus punch with 3# dumbbell 2x10 bilat (with increased pain on right side) Prone rows with 3# dumbbells 2x10 right Prone shoulder extension with 3# dumbbells 2x10 right Side-lying open books 2x10 Quadruped bird dog 2x10 Childs pose x10 sec Cold pack x10 min to right shoulder   08/20/2024: Arm bike: Level 2x 6 minutes (3/3)- PT present to discuss progress  Supine on foam roll: green theraband horizontal abduction, D2 and ER 2x10 Supine on foam roll:  chest stretch with hands behind head 5 x 10 sec hold Left sidelying:  right shoulder ER and abduction.  2x10 each Standing right scapular stabilization:  4D blue 2# plyoball x20, CW/CCW rotations x20 each direction Standing 3 way scapular stabilization wall clocks  with blue loop 2x5 bilat Standing shoulder flexion and scaption with 3# dumbbells 2x10 each bilat Supine serratus punch with 3# dumbbell 2x10 bilat (with increased pain on right side) Barre push-ups 2x10 Bent forward shoulder extension with 3# dumbbell 2x10 bilat Bent forward rows with 3# dumbbell x10 bilat Side-lying open books x10 Cold pack x10 min to right shoulder    PATIENT EDUCATION: Education details: PT eval findings, anticipated POC, initial HEP, and postural awareness   Person educated: Patient Education method: Explanation, Demonstration, Tactile cues, Verbal cues, and Handouts Education comprehension: verbalized understanding and returned demonstration  HOME EXERCISE PROGRAM: Access Code: [email or web address] URL: [email or web address] Date: 07/31/2024 Prepared by: Burnard  Exercises - Single Arm Shoulder Extension with Resistance  - 1 x daily - 3 x weekly - 1-3 sets - 10 reps - Shoulder External Rotation with Anchored Resistance  - 1 x daily - 3 x weekly - 1-3 sets - 10 reps - Shoulder Internal Rotation with Resistance  - 1 x daily - 3 x weekly - 1-3 sets - 10 reps - Standing Single Arm Shoulder Flexion with Posterior Anchored Resistance (Mirrored)  - 1 x daily - 3 x weekly - 2 sets - 10 reps -  Sidelying Open Book Thoracic Rotation with Knee on Foam Roll  - 2 x daily - 7 x weekly - 1 sets - 10 reps - 5 hold - Supine Shoulder Horizontal Abduction with Resistance  - 2 x daily - 7 x weekly - 2 sets - 10 reps - Supine Bilateral Shoulder External Rotation with Resistance  - 2 x daily - 7 x weekly - 2 sets - 10 reps - Supine PNF D2 Flexion with Resistance  - 2 x daily - 7 x weekly - 2 sets - 10 reps  ASSESSMENT:  CLINICAL IMPRESSION: Increased shoulder pain with chest press and rows at the gym but also overnight.  Trial of KT tape for proprioceptive support to shoulder with good initial response.  Therapist monitoring response and instructing in modifications to decrease  pain.  Reports reduction in shoulder pain at the end of session.  OBJECTIVE IMPAIRMENTS: decreased activity tolerance, decreased knowledge of condition, decreased strength, hypomobility, increased fascial restrictions, increased muscle spasms, impaired flexibility, impaired UE functional use, postural dysfunction, and pain.   ACTIVITY LIMITATIONS: lifting, sleeping, and weightbearing through shoulder  PARTICIPATION LIMITATIONS: not restricted but painful  PERSONAL FACTORS: 1-2 comorbidities: bipolar depression, acute low back pain are also affecting patient's functional outcome.   REHAB POTENTIAL: Excellent  CLINICAL DECISION MAKING: Evolving/moderate complexity  EVALUATION COMPLEXITY: Low   GOALS: Goals reviewed with patient? Yes  SHORT TERM GOALS: Target date: 08/22/24  Patient able to sleep without pain Baseline:  Goal status: Ongoing (still hurts when he lies on his right side as of 08/20/24)  2.  Patient will be independent with initial HEP  Baseline: 07/31/24 Goal status: Met on 08/20/24    LONG TERM GOALS: Target date: 09/19/24  Patient to report pain no greater than 2/10 with weightbearing and ADLs Baseline: 6-7/10 (08/02/24) Goal status: In progress   2.  Patient to be independent with advanced HEP  Baseline:  Goal status: Ongoing  3.  Patient to demonstrate 5/5 R shoulder strength to improve function.  Baseline:  Goal status: INITIAL  4.  Average PSFS score to improve by 2 points showing functional improvement. Baseline: 6.5 Goal status: INITIAL   PLAN:  PT FREQUENCY: 2x/week  PT DURATION: 8 weeks  PLANNED INTERVENTIONS: 97164- PT Re-evaluation, 97110-Therapeutic exercises, 97530- Therapeutic activity, 97112- Neuromuscular re-education, 97535- Self Care, 02859- Manual therapy, [phone]- Aquatic Therapy, [phone]- Electrical stimulation (unattended), 97016- Vasopneumatic device, [id number]- Ultrasound, [id number]- Ionotophoresis 4mg /ml Dexamethasone, 79439 (1-2 muscles),  20561 (3+ muscles)- Dry Needling, Patient/Family education, Taping, Joint mobilization, Spinal mobilization, Cryotherapy, and Moist heat  PLAN FOR NEXT SESSION: see how ortho appt went 1/16; assess response to KT; Rt shoulder and postural strengthening, give patient a green band; more overhead motions; manual/DN to R UQ, spinal mobs as needed    Glade Pesa, PT 08/30/24 9:19 AM Phone: [phone] Fax: [phone]  [facility] [address] [place], [place] [address] Phone # [phone] Fax [phone]   "

## 2024-08-31 ENCOUNTER — Ambulatory Visit: Payer: MEDICAID | Admitting: Rehabilitative and Restorative Service Providers"

## 2024-09-05 ENCOUNTER — Encounter: Payer: Self-pay | Admitting: Rehabilitative and Restorative Service Providers"

## 2024-09-05 ENCOUNTER — Ambulatory Visit: Payer: MEDICAID | Admitting: Rehabilitative and Restorative Service Providers"

## 2024-09-05 DIAGNOSIS — M5459 Other low back pain: Secondary | ICD-10-CM

## 2024-09-05 DIAGNOSIS — M6281 Muscle weakness (generalized): Secondary | ICD-10-CM

## 2024-09-05 DIAGNOSIS — M25511 Pain in right shoulder: Secondary | ICD-10-CM | POA: Diagnosis not present

## 2024-09-05 NOTE — Therapy (Signed)
 " OUTPATIENT PHYSICAL THERAPY TREATMENT NOTE   Patient Name: [name] MRN: [id number] DOB:28-Jun-1981, 44 y.o., male Today's Date: 09/05/2024  END OF SESSION:  PT End of Session - 09/05/24 0800     Visit Number 9    Date for Recertification  09/19/24    Authorization Type [employer]-16 VISITS 07/24/24-09/19/24    Authorization - Visit Number 8    Authorization - Number of Visits 16    PT Start Time 0757    PT Stop Time 0835    PT Time Calculation (min) 38 min    Activity Tolerance Patient tolerated treatment well    Behavior During Therapy [facility] for tasks assessed/performed             Past Medical History:  Diagnosis Date   ADHD (attention deficit hyperactivity disorder)    Anxiety    Bipolar 1 disorder (HCC)    Depression    Encephalopathy acute 11/18/2015   Excessive weight loss    IBS (irritable bowel syndrome)    Insomnia    Ulcerative colitis    Past Surgical History:  Procedure Laterality Date   ANAL FISSURECTOMY     COLON SURGERY     COLONOSCOPY W/ BIOPSIES     hx ulcerative colitis   Patient Active Problem List   Diagnosis Date Noted   Financial difficulties 12/24/2017   Dental infection 10/20/2016   Nausea 05/31/2016   Rectal bleeding 05/28/2016   Borderline abnormal TFTs 04/20/2016   Other social stressor 01/28/2016   Falls 12/19/2015   Visual acuity reduced 12/19/2015   Orthostatic hypotension 11/18/2015   Encephalopathy acute 11/18/2015   Limited mobility 10/27/2015   Abdominal pain 09/12/2015   Mixed bipolar I disorder (HCC) 03/18/2015   Feeding difficulties and mismanagement 07/20/2013   Immunocompromised state (HCC) 07/20/2013   Protein-calorie malnutrition, severe 07/09/2013   EKG, abnormal 05/14/2012   ADHD (attention deficit hyperactivity disorder) 05/12/2012   Insomnia 05/06/2010   HYPERCHOLESTEROLEMIA, PURE 06/14/2008   Anxiety state 11/27/2007   Ulcerative colitis (HCC) 04/26/2001    PCP: Clerance Lonell HERO,  MD   REFERRING PROVIDER: Claretha Almarie Anon FNP   REFERRING DIAG:  M25.511 (ICD-10-CM) - Right shoulder pain  S46.011A (ICD-10-CM) - Strain of muscle(s) and tendon(s) of the rotator cuff of right shoulder, initial encounter    THERAPY DIAG:  Acute pain of right shoulder  Muscle weakness (generalized)  Other low back pain  Rationale for Evaluation and Treatment: Rehabilitation  ONSET DATE: 4-5 weeks ago  SUBJECTIVE:  SUBJECTIVE STATEMENT: Patient reports that he is feeling better, that the MD advised him to decrease the weights that he was using by half and that the injury could take 4-6 months to heal.  He admits that he likes to go hard at the gym, but was doing less and his shoulder was feeling better.  Hand dominance: Left  PERTINENT HISTORY:  acute LBP, bipolar, cholectomy  PAIN: 09/05/24 Are you having pain? Yes: NPRS scale: 3/10 Pain location: right shoulder lateral subacromial  Pain description: gritty feeling with side planks and full ER at 90 deg Aggravating factors: side planks and ER  Relieving factors: rest  PRECAUTIONS: None  RED FLAGS: None   WEIGHT BEARING RESTRICTIONS: No  FALLS:  Has patient fallen in last 6 months? No  LIVING ENVIRONMENT: Lives with: lives with their family Lives in: House/apartment  OCCUPATION: Disabled due to cholectomy  PLOF: Independent  PATIENT GOALS:Get rid of pain and get to previous level of function.  NEXT MD VISIT: none scheduled  OBJECTIVE:  Note: Objective measures were completed at Evaluation unless otherwise noted.  DIAGNOSTIC FINDINGS:  none  PATIENT SURVEYS:  THE PATIENT SPECIFIC FUNCTIONAL SCALE  Place score of 0-10 (0 = unable to perform activity and 10 = able to perform activity at the same level as before  injury or problem)  Activity Date: 07/24/24    sleeping 7    2. Weightbearing (yoga eg) 6    3.     4.      Total Score 6.5      Total Score = Sum of activity scores/number of activities  Minimally Detectable Change: 3 points (for single activity); 2 points (for average score)  [facility] (nd). The Patient Specific Functional Scale . Retrieved from [email or web address]    COGNITION: Overall cognitive status: Within functional limits for tasks assessed     SENSATION: WFL  POSTURE: Increased tone in R levator  UPPER EXTREMITY ROM: Full   UPPER EXTREMITY MMT:  MMT Right eval Left eval  Shoulder flexion 4+   Shoulder extension 4   Shoulder abduction 5   Shoulder adduction    Shoulder internal rotation 4+*   Shoulder external rotation 5   Middle trapezius    Lower trapezius    Elbow flexion    Elbow extension    Grip strength (lbs)    (Blank rows = not tested)  SHOULDER SPECIAL TESTS: Impingement tests: Neer impingement test: negative and Hawkins/Kennedy impingement test: positive  Rotator cuff assessment: Empty can test: positive , Full can test: negative, and Gerber lift off test: negative   JOINT MOBILITY TESTING:  NT  PALPATION:  Palpation: TTP at ant shoulder. Increased tissue tension in R levator                                                                                                                             TREATMENT DATE:   09/05/2024: Arm bike: Level 1.5 x  6 minutes (3/3)- PT present to discuss progress Standing 3 way scapular stabilization wall clocks with blue loop x5 bilat Standing blue loop wall scoops 2x5 Standing lat pull-down 25# 2x5 Side step green band isometric internal and external rotation 2x5 each bilat Supine serratus punch with 2# dumbbell 2x10 bilat Supine arm tracing with 2# dumbbell in hand of alphabet A-Z bilat Prone rows with 2# dumbbells 2x10  right Prone shoulder extension with 2# dumbbells 2x10 right Prone horizontal abduction with 2# dumbbells 2x10 right Standing rows 10# cable pulley 2x10   08/30/2024: Status update including gym modifications avoidance of shoulder hyperextension KT: 3 strips :vertical strips anterior deltoid and posterior deltoid; 1 horizontal strip distal deltoids (instructed in removal before ortho appt tomorrow) Standing 3 way scapular stabilization wall clocks with blue loop x5 bilat Standing blue loop wall scoops 5x Standing lat pull-down 25# 5x cues to avoid hyperextension Seated landmine press out with red power cord small range of motion for minimal pain 10x right/left Side step green band isometric internal and external rotation 5x each Flexion isometric with ball on wall 30% submax effort 5x2  08/28/2024: Arm bike: Level 1.5 x 6 minutes (3/3)- PT present to discuss progress Standing scapular stabilization:  4D blue 2# plyoball x20, CW/CCW rotations x20 each direction bilat Standing 3 way scapular stabilization wall clocks with blue loop 2x5 bilat Standing lower trap lift offs 2x10 Standing lat pull-down 25# 2x5 Standing rows 10# cable pulley 2x10 Supine on foam roll: red theraband horizontal abduction, D2 and ER 2x10 Side-lying open books 2x10 bilat Supine serratus punch with 2# dumbbell 2x10 bilat  Supine shoulder press (scapular retraction) 2x10 Supine cervical retraction 2x10 Supine arm tracing with 2# dumbbell in hand of alphabet A-Z bilat Cold pack x10 min to right shoulder    PATIENT EDUCATION: Education details: PT eval findings, anticipated POC, initial HEP, and postural awareness   Person educated: Patient Education method: Explanation, Demonstration, Tactile cues, Verbal cues, and Handouts Education comprehension: verbalized understanding and returned demonstration  HOME EXERCISE PROGRAM: Access Code: [email or web address] URL: [email or web address] Date:  07/31/2024 Prepared by: Burnard  Exercises - Single Arm Shoulder Extension with Resistance  - 1 x daily - 3 x weekly - 1-3 sets - 10 reps - Shoulder External Rotation with Anchored Resistance  - 1 x daily - 3 x weekly - 1-3 sets - 10 reps - Shoulder Internal Rotation with Resistance  - 1 x daily - 3 x weekly - 1-3 sets - 10 reps - Standing Single Arm Shoulder Flexion with Posterior Anchored Resistance (Mirrored)  - 1 x daily - 3 x weekly - 2 sets - 10 reps - Sidelying Open Book Thoracic Rotation with Knee on Foam Roll  - 2 x daily - 7 x weekly - 1 sets - 10 reps - 5 hold - Supine Shoulder Horizontal Abduction with Resistance  - 2 x daily - 7 x weekly - 2 sets - 10 reps - Supine Bilateral Shoulder External Rotation with Resistance  - 2 x daily - 7 x weekly - 2 sets - 10 reps - Supine PNF D2 Flexion with Resistance  - 2 x daily - 7 x weekly - 2 sets - 10 reps  ASSESSMENT:  CLINICAL IMPRESSION: Mr Bou presents to PT reporting that he went to the MD and was advised to lighten up his gym workouts and not do as much weight.  Patient states that since doing this, he has had less pain and is feeling better.  This PT  went through his gym routine while he was on the UBE and reiterated that he needs to be certain that he is not having pain while he is working out and the difference between pain and muscle soreness and listening to his body.  Patient verbalized his understanding.  Patient able to progress through session without increased pain and reported overall, feeling better.  Patient continues to require skilled PT to progress towards goal related activities.  OBJECTIVE IMPAIRMENTS: decreased activity tolerance, decreased knowledge of condition, decreased strength, hypomobility, increased fascial restrictions, increased muscle spasms, impaired flexibility, impaired UE functional use, postural dysfunction, and pain.   ACTIVITY LIMITATIONS: lifting, sleeping, and weightbearing through  shoulder  PARTICIPATION LIMITATIONS: not restricted but painful  PERSONAL FACTORS: 1-2 comorbidities: bipolar depression, acute low back pain are also affecting patient's functional outcome.   REHAB POTENTIAL: Excellent  CLINICAL DECISION MAKING: Evolving/moderate complexity  EVALUATION COMPLEXITY: Low   GOALS: Goals reviewed with patient? Yes  SHORT TERM GOALS: Target date: 08/22/24  Patient able to sleep without pain Baseline:  Goal status: Ongoing (still hurts when he lies on his right side as of 08/20/24)  2.  Patient will be independent with initial HEP  Baseline: 07/31/24 Goal status: Met on 08/20/24    LONG TERM GOALS: Target date: 09/19/24  Patient to report pain no greater than 2/10 with weightbearing and ADLs Baseline: 6-7/10 (08/02/24) Goal status: In progress   2.  Patient to be independent with advanced HEP  Baseline:  Goal status: Ongoing  3.  Patient to demonstrate 5/5 R shoulder strength to improve function.  Baseline:  Goal status: INITIAL  4.  Average PSFS score to improve by 2 points showing functional improvement. Baseline: 6.5 Goal status: INITIAL   PLAN:  PT FREQUENCY: 2x/week  PT DURATION: 8 weeks  PLANNED INTERVENTIONS: 97164- PT Re-evaluation, 97110-Therapeutic exercises, 97530- Therapeutic activity, 97112- Neuromuscular re-education, 97535- Self Care, 02859- Manual therapy, [phone]- Aquatic Therapy, [phone]- Electrical stimulation (unattended), 97016- Vasopneumatic device, [id number]- Ultrasound, [id number]- Ionotophoresis 4mg /ml Dexamethasone, 79439 (1-2 muscles), 20561 (3+ muscles)- Dry Needling, Patient/Family education, Taping, Joint mobilization, Spinal mobilization, Cryotherapy, and Moist heat  PLAN FOR NEXT SESSION: see how ortho appt went 1/16; assess response to KT; Rt shoulder and postural strengthening, give patient a green band; more overhead motions; manual/DN to R UQ, spinal mobs as needed    Jarrell Laming, PT, DPT 09/05/24, 9:17  AM  [facility] [address], [address] [place], [place] [address] Phone # [phone] Fax [phone]   "

## 2024-09-07 ENCOUNTER — Encounter: Payer: Self-pay | Admitting: Rehabilitative and Restorative Service Providers"

## 2024-09-07 ENCOUNTER — Ambulatory Visit: Payer: MEDICAID | Admitting: Rehabilitative and Restorative Service Providers"

## 2024-09-07 DIAGNOSIS — M25511 Pain in right shoulder: Secondary | ICD-10-CM

## 2024-09-07 DIAGNOSIS — M6281 Muscle weakness (generalized): Secondary | ICD-10-CM

## 2024-09-07 DIAGNOSIS — M5459 Other low back pain: Secondary | ICD-10-CM

## 2024-09-07 NOTE — Therapy (Signed)
 " OUTPATIENT PHYSICAL THERAPY TREATMENT NOTE   Patient Name: [name] MRN: [id number] DOB:Feb 13, 1981, 44 y.o., male Today's Date: 09/07/2024  END OF SESSION:  PT End of Session - 09/07/24 0855     Visit Number 10    Date for Recertification  09/19/24    Authorization Type [employer]-16 VISITS 07/24/24-09/19/24    Authorization - Visit Number 9    Authorization - Number of Visits 16    PT Start Time 0758    PT Stop Time 0843    PT Time Calculation (min) 45 min    Activity Tolerance Patient tolerated treatment well    Behavior During Therapy [facility] for tasks assessed/performed              Past Medical History:  Diagnosis Date   ADHD (attention deficit hyperactivity disorder)    Anxiety    Bipolar 1 disorder (HCC)    Depression    Encephalopathy acute 11/18/2015   Excessive weight loss    IBS (irritable bowel syndrome)    Insomnia    Ulcerative colitis    Past Surgical History:  Procedure Laterality Date   ANAL FISSURECTOMY     COLON SURGERY     COLONOSCOPY W/ BIOPSIES     hx ulcerative colitis   Patient Active Problem List   Diagnosis Date Noted   Financial difficulties 12/24/2017   Dental infection 10/20/2016   Nausea 05/31/2016   Rectal bleeding 05/28/2016   Borderline abnormal TFTs 04/20/2016   Other social stressor 01/28/2016   Falls 12/19/2015   Visual acuity reduced 12/19/2015   Orthostatic hypotension 11/18/2015   Encephalopathy acute 11/18/2015   Limited mobility 10/27/2015   Abdominal pain 09/12/2015   Mixed bipolar I disorder (HCC) 03/18/2015   Feeding difficulties and mismanagement 07/20/2013   Immunocompromised state (HCC) 07/20/2013   Protein-calorie malnutrition, severe 07/09/2013   EKG, abnormal 05/14/2012   ADHD (attention deficit hyperactivity disorder) 05/12/2012   Insomnia 05/06/2010   HYPERCHOLESTEROLEMIA, PURE 06/14/2008   Anxiety state 11/27/2007   Ulcerative colitis (HCC) 04/26/2001    PCP: Clerance Lonell HERO,  MD   REFERRING PROVIDER: Claretha Almarie Anon FNP   REFERRING DIAG:  M25.511 (ICD-10-CM) - Right shoulder pain  S46.011A (ICD-10-CM) - Strain of muscle(s) and tendon(s) of the rotator cuff of right shoulder, initial encounter    THERAPY DIAG:  Acute pain of right shoulder  Muscle weakness (generalized)  Other low back pain  Rationale for Evaluation and Treatment: Rehabilitation  ONSET DATE: 4-5 weeks ago  SUBJECTIVE:  SUBJECTIVE STATEMENT: Patient reports he is feeling better, he states he has been doing a little more than half weight in the gym but with more reps and his shoulder has been feeling good. He reports a 1-2/10 pain. Today, prior to the PT session, he states he has already gone to the gym and yoga.  Hand dominance: Left  PERTINENT HISTORY:  acute LBP, bipolar, cholectomy  PAIN: 09/07/24 Are you having pain? Yes: NPRS scale: 1-2/10 Pain location: right shoulder lateral subacromial  Pain description: gritty feeling with side planks and full ER at 90 deg Aggravating factors: side planks and ER  Relieving factors: rest  PRECAUTIONS: None  RED FLAGS: None   WEIGHT BEARING RESTRICTIONS: No  FALLS:  Has patient fallen in last 6 months? No  LIVING ENVIRONMENT: Lives with: lives with their family Lives in: House/apartment  OCCUPATION: Disabled due to cholectomy  PLOF: Independent  PATIENT GOALS:Get rid of pain and get to previous level of function.  NEXT MD VISIT: none scheduled  OBJECTIVE:  Note: Objective measures were completed at Evaluation unless otherwise noted.  DIAGNOSTIC FINDINGS:  none  PATIENT SURVEYS:  THE PATIENT SPECIFIC FUNCTIONAL SCALE  Place score of 0-10 (0 = unable to perform activity and 10 = able to perform activity at the same level as  before injury or problem)  Activity Date: 07/24/24    sleeping 7    2. Weightbearing (yoga eg) 6    3.     4.      Total Score 6.5      Total Score = Sum of activity scores/number of activities  Minimally Detectable Change: 3 points (for single activity); 2 points (for average score)  [facility] (nd). The Patient Specific Functional Scale . Retrieved from [email or web address]    COGNITION: Overall cognitive status: Within functional limits for tasks assessed     SENSATION: WFL  POSTURE: Increased tone in R levator  UPPER EXTREMITY ROM: Full   UPPER EXTREMITY MMT:  MMT Right eval Left eval  Shoulder flexion 4+   Shoulder extension 4   Shoulder abduction 5   Shoulder adduction    Shoulder internal rotation 4+*   Shoulder external rotation 5   Middle trapezius    Lower trapezius    Elbow flexion    Elbow extension    Grip strength (lbs)    (Blank rows = not tested)  SHOULDER SPECIAL TESTS: Impingement tests: Neer impingement test: negative and Hawkins/Kennedy impingement test: positive  Rotator cuff assessment: Empty can test: positive , Full can test: negative, and Gerber lift off test: negative   JOINT MOBILITY TESTING:  NT  PALPATION:  Palpation: TTP at ant shoulder. Increased tissue tension in R levator                                                                                                                             TREATMENT DATE:  09/07/2024: Arm bike: Level 1.5 x 6  minutes (3/3)- PT present to discuss progress Standing 3 way scapular stabilization wall clocks with blue loop x5 bilat Standing blue loop wall scoops 2x5 Standing lat pull-down 25# 2x5 Side step green band isometric internal and external rotation 2x5 each bilat Supine serratus punch with 2# dumbbell 2x10 bilat Supine shoulder horizontal abduction with green tband 2x10 Supine arm tracing with 2# dumbbell in hand  of alphabet A-Z bilat Sidelying ER to neutral 2x15 Prone rows with 2# dumbbells 2x10 right Prone shoulder extension with 2# dumbbells 2x10 right Prone horizontal abduction with 2# dumbbells 2x10 right Bicep curl with 6# weight x10, 7# weight x10 bilat Slow pronation supination with 6# weight x10, 7# weight x10 bilat Standing shoulder abduction with 3# weight 2x8 (decreased range)   09/05/2024: Arm bike: Level 1.5 x 6 minutes (3/3)- PT present to discuss progress Standing 3 way scapular stabilization wall clocks with blue loop x5 bilat Standing blue loop wall scoops 2x5 Standing lat pull-down 25# 2x5 Side step green band isometric internal and external rotation 2x5 each bilat Supine serratus punch with 2# dumbbell 2x10 bilat Supine arm tracing with 2# dumbbell in hand of alphabet A-Z bilat Prone rows with 2# dumbbells 2x10 right Prone shoulder extension with 2# dumbbells 2x10 right Prone horizontal abduction with 2# dumbbells 2x10 right Standing rows 10# cable pulley 2x10   08/30/2024: Status update including gym modifications avoidance of shoulder hyperextension KT: 3 strips :vertical strips anterior deltoid and posterior deltoid; 1 horizontal strip distal deltoids (instructed in removal before ortho appt tomorrow) Standing 3 way scapular stabilization wall clocks with blue loop x5 bilat Standing blue loop wall scoops 5x Standing lat pull-down 25# 5x cues to avoid hyperextension Seated landmine press out with red power cord small range of motion for minimal pain 10x right/left Side step green band isometric internal and external rotation 5x each Flexion isometric with ball on wall 30% submax effort 5x2   PATIENT EDUCATION: Education details: PT eval findings, anticipated POC, initial HEP, and postural awareness   Person educated: Patient Education method: Explanation, Demonstration, Tactile cues, Verbal cues, and Handouts Education comprehension: verbalized understanding and  returned demonstration  HOME EXERCISE PROGRAM: Access Code: [email or web address] URL: [email or web address] Date: 07/31/2024 Prepared by: Burnard  Exercises - Single Arm Shoulder Extension with Resistance  - 1 x daily - 3 x weekly - 1-3 sets - 10 reps - Shoulder External Rotation with Anchored Resistance  - 1 x daily - 3 x weekly - 1-3 sets - 10 reps - Shoulder Internal Rotation with Resistance  - 1 x daily - 3 x weekly - 1-3 sets - 10 reps - Standing Single Arm Shoulder Flexion with Posterior Anchored Resistance (Mirrored)  - 1 x daily - 3 x weekly - 2 sets - 10 reps - Sidelying Open Book Thoracic Rotation with Knee on Foam Roll  - 2 x daily - 7 x weekly - 1 sets - 10 reps - 5 hold - Supine Shoulder Horizontal Abduction with Resistance  - 2 x daily - 7 x weekly - 2 sets - 10 reps - Supine Bilateral Shoulder External Rotation with Resistance  - 2 x daily - 7 x weekly - 2 sets - 10 reps - Supine PNF D2 Flexion with Resistance  - 2 x daily - 7 x weekly - 2 sets - 10 reps  ASSESSMENT:  CLINICAL IMPRESSION:  Mr Meister presents to PT reporting he is feeling good today as he has continued to lighten his gym workouts to do less weight  but more reps (he reports he is doing slightly more than half his max weight). The PT continued to instruct him not to do his max weight until his shoulder is feeling better. He was able to complete the exercises with no increase of pain. Supine ER with green tband caused patient some pain, exercise was modified to ER in sidelying to neutral with no weight which patient reports no pain. Bicep control and strengthening was added today which patient tolerated well, cueing required to maintain a slow tempo. Patient continues to require skilled PT to progress towards goal related activities.  OBJECTIVE IMPAIRMENTS: decreased activity tolerance, decreased knowledge of condition, decreased strength, hypomobility, increased fascial restrictions, increased muscle spasms,  impaired flexibility, impaired UE functional use, postural dysfunction, and pain.   ACTIVITY LIMITATIONS: lifting, sleeping, and weightbearing through shoulder  PARTICIPATION LIMITATIONS: not restricted but painful  PERSONAL FACTORS: 1-2 comorbidities: bipolar depression, acute low back pain are also affecting patient's functional outcome.   REHAB POTENTIAL: Excellent  CLINICAL DECISION MAKING: Evolving/moderate complexity  EVALUATION COMPLEXITY: Low   GOALS: Goals reviewed with patient? Yes  SHORT TERM GOALS: Target date: 08/22/24  Patient able to sleep without pain Baseline:  Goal status: Ongoing (still hurts when he lies on his right side as of 08/20/24)  2.  Patient will be independent with initial HEP  Baseline: 07/31/24 Goal status: Met on 08/20/24    LONG TERM GOALS: Target date: 09/19/24  Patient to report pain no greater than 2/10 with weightbearing and ADLs Baseline: 6-7/10 (08/02/24) Goal status: In progress   2.  Patient to be independent with advanced HEP  Baseline:  Goal status: Ongoing  3.  Patient to demonstrate 5/5 R shoulder strength to improve function.  Baseline:  Goal status: INITIAL  4.  Average PSFS score to improve by 2 points showing functional improvement. Baseline: 6.5 Goal status: INITIAL   PLAN:  PT FREQUENCY: 2x/week  PT DURATION: 8 weeks  PLANNED INTERVENTIONS: 02835- PT Re-evaluation, 97110-Therapeutic exercises, 97530- Therapeutic activity, 97112- Neuromuscular re-education, 97535- Self Care, 02859- Manual therapy, [phone]- Aquatic Therapy, [phone]- Electrical stimulation (unattended), 97016- Vasopneumatic device, [id number]- Ultrasound, [id number]- Ionotophoresis 4mg /ml Dexamethasone, 79439 (1-2 muscles), 20561 (3+ muscles)- Dry Needling, Patient/Family education, Taping, Joint mobilization, Spinal mobilization, Cryotherapy, and Moist heat  PLAN FOR NEXT SESSION: see how ortho appt went 1/16; assess response to KT; Rt shoulder and postural  strengthening, give patient a green band; more overhead motions; manual/DN to R UQ, spinal mobs as needed   [employer], SPT 09/07/24, 9:57 AM    I agree with the following treatment note after reviewing documentation. This session was performed under the supervision of a licensed clinician.  Jarrell Laming, PT, DPT 09/07/24, 9:57 AM  [facility] [address] [place], [place] [address] Phone # [phone] Fax [phone]   "

## 2024-09-10 ENCOUNTER — Encounter: Payer: Self-pay | Admitting: Rehabilitative and Restorative Service Providers"

## 2024-09-11 ENCOUNTER — Ambulatory Visit: Payer: MEDICAID | Admitting: Rehabilitative and Restorative Service Providers"

## 2024-09-13 ENCOUNTER — Ambulatory Visit: Payer: MEDICAID | Admitting: Rehabilitative and Restorative Service Providers"

## 2024-09-13 ENCOUNTER — Encounter: Payer: Self-pay | Admitting: Rehabilitative and Restorative Service Providers"

## 2024-09-13 DIAGNOSIS — M25511 Pain in right shoulder: Secondary | ICD-10-CM

## 2024-09-13 DIAGNOSIS — M6281 Muscle weakness (generalized): Secondary | ICD-10-CM

## 2024-09-13 DIAGNOSIS — M5459 Other low back pain: Secondary | ICD-10-CM

## 2024-09-13 NOTE — Therapy (Signed)
 " OUTPATIENT PHYSICAL THERAPY TREATMENT NOTE AND REASSESSMENT NOTE   Patient Name: [name] MRN: [id number] DOB:1981-02-08, 44 y.o., male Today's Date: 09/13/2024  END OF SESSION:  PT End of Session - 09/13/24 0908     Visit Number 11    Date for Recertification  11/09/24    Authorization Type [employer]16 VISITS 07/24/24-09/19/24    Authorization - Visit Number 10    Authorization - Number of Visits 16    PT Start Time 0802    PT Stop Time 0855    PT Time Calculation (min) 53 min    Activity Tolerance Patient tolerated treatment well    Behavior During Therapy [facility] for tasks assessed/performed               Past Medical History:  Diagnosis Date   ADHD (attention deficit hyperactivity disorder)    Anxiety    Bipolar 1 disorder (HCC)    Depression    Encephalopathy acute 11/18/2015   Excessive weight loss    IBS (irritable bowel syndrome)    Insomnia    Ulcerative colitis    Past Surgical History:  Procedure Laterality Date   ANAL FISSURECTOMY     COLON SURGERY     COLONOSCOPY W/ BIOPSIES     hx ulcerative colitis   Patient Active Problem List   Diagnosis Date Noted   Financial difficulties 12/24/2017   Dental infection 10/20/2016   Nausea 05/31/2016   Rectal bleeding 05/28/2016   Borderline abnormal TFTs 04/20/2016   Other social stressor 01/28/2016   Falls 12/19/2015   Visual acuity reduced 12/19/2015   Orthostatic hypotension 11/18/2015   Encephalopathy acute 11/18/2015   Limited mobility 10/27/2015   Abdominal pain 09/12/2015   Mixed bipolar I disorder (HCC) 03/18/2015   Feeding difficulties and mismanagement 07/20/2013   Immunocompromised state (HCC) 07/20/2013   Protein-calorie malnutrition, severe 07/09/2013   EKG, abnormal 05/14/2012   ADHD (attention deficit hyperactivity disorder) 05/12/2012   Insomnia 05/06/2010   HYPERCHOLESTEROLEMIA, PURE 06/14/2008   Anxiety state 11/27/2007   Ulcerative colitis (HCC) 04/26/2001    PCP:  Clerance Lonell HERO, MD   REFERRING PROVIDER: Claretha Almarie Anon FNP   REFERRING DIAG:  M25.511 (ICD-10-CM) - Right shoulder pain  S46.011A (ICD-10-CM) - Strain of muscle(s) and tendon(s) of the rotator cuff of right shoulder, initial encounter    THERAPY DIAG:  Acute pain of right shoulder - Plan: PT plan of care cert/re-cert  Muscle weakness (generalized) - Plan: PT plan of care cert/re-cert  Other low back pain - Plan: PT plan of care cert/re-cert  Rationale for Evaluation and Treatment: Rehabilitation  ONSET DATE: 4-5 weeks ago  SUBJECTIVE:  SUBJECTIVE STATEMENT: Patient reports his shoulder is doing pretty well, he states he had one day I the gym where he was too aggressive on his shoulder and the pain went up to 6/10, but backed off since. Pain is at a 3/10 today.   Hand dominance: Left  PERTINENT HISTORY:  acute LBP, bipolar, cholectomy  PAIN: 09/13/24 Are you having pain? Yes: NPRS scale: 3/10 Pain location: right shoulder lateral subacromial  Pain description: gritty feeling with side planks and full ER at 90 deg Aggravating factors: side planks and ER  Relieving factors: rest  PRECAUTIONS: None  RED FLAGS: None   WEIGHT BEARING RESTRICTIONS: No  FALLS:  Has patient fallen in last 6 months? No  LIVING ENVIRONMENT: Lives with: lives with their family Lives in: House/apartment  OCCUPATION: Disabled due to cholectomy  PLOF: Independent  PATIENT GOALS:Get rid of pain and get to previous level of function.  NEXT MD VISIT: none scheduled  OBJECTIVE:  Note: Objective measures were completed at Evaluation unless otherwise noted.  DIAGNOSTIC FINDINGS:  none  PATIENT SURVEYS:   09/13/2024: Quick DASH:  52.27    THE PATIENT SPECIFIC FUNCTIONAL SCALE  Place score of  0-10 (0 = unable to perform activity and 10 = able to perform activity at the same level as before injury or problem)  Activity Date: 07/24/24 09/13/24   sleeping 7 3-4   2. Weightbearing (yoga eg) 6 4   3.     4.      Total Score 6.5 4     Total Score = Sum of activity scores/number of activities  Minimally Detectable Change: 3 points (for single activity); 2 points (for average score)  [facility] (nd). The Patient Specific Functional Scale . Retrieved from [email or web address]    COGNITION: Overall cognitive status: Within functional limits for tasks assessed     SENSATION: WFL  POSTURE: Increased tone in R levator  UPPER EXTREMITY ROM: Full   UPPER EXTREMITY MMT:  MMT Right eval Left 09/13/24 Right 09/13/24  Shoulder flexion 4+ 5 4+*  Shoulder extension 4    Shoulder abduction 5 4+ 4+*  Shoulder adduction     Shoulder internal rotation 4+* 5 5*  Shoulder external rotation 5 5 4+*  Middle trapezius  5 4*  Lower trapezius  4 4-*  Elbow flexion     Elbow extension     Grip strength (lbs)  100 80  (Blank rows = not tested)  SHOULDER SPECIAL TESTS: Impingement tests: Neer impingement test: negative and Hawkins/Kennedy impingement test: positive  Rotator cuff assessment: Empty can test: positive , Full can test: negative, and Gerber lift off test: negative   PALPATION:  Palpation: TTP at ant shoulder. Increased tissue tension in R levator                                                                                                                             TREATMENT DATE:  09/13/24: Arm  bike: Level 1.5 x 6 minutes (3/3)- PT present to discuss progress Standing 3 way scapular stabilization wall clocks with blue loop x5 bilat Standing blue ball clocks 3/6 9/12 2x10 bilat Standing lat pull-down 25# 2x5 (cueing to pull with parascaps) Prone rows with 2# dumbbells 2x10 bilat Prone shoulder  extension with 2# dumbbells x10 bilat Prone shoulder horizontal abduction to neutral (with elbow bent) 2x12 Sidelying ER to neutral 2x15 Sidelying elbow at side slow pronation supination with 2# weight 2x10 bilat Supine D2 with red tband 2x10 bilat Educated on proper body mechanics of scapular stabilization   09/07/2024: Arm bike: Level 1.5 x 6 minutes (3/3)- PT present to discuss progress Standing 3 way scapular stabilization wall clocks with blue loop x5 bilat Standing blue loop wall scoops 2x5 Standing lat pull-down 25# 2x5 Side step green band isometric internal and external rotation 2x5 each bilat Supine serratus punch with 2# dumbbell 2x10 bilat Supine shoulder horizontal abduction with green tband 2x10 Supine arm tracing with 2# dumbbell in hand of alphabet A-Z bilat Sidelying ER to neutral 2x15 Prone rows with 2# dumbbells 2x10 right Prone shoulder extension with 2# dumbbells 2x10 right Prone horizontal abduction with 2# dumbbells 2x10 right Bicep curl with 6# weight x10, 7# weight x10 bilat Slow pronation supination with 6# weight x10, 7# weight x10 bilat Standing shoulder abduction with 3# weight 2x8 (decreased range)   09/05/2024: Arm bike: Level 1.5 x 6 minutes (3/3)- PT present to discuss progress Standing 3 way scapular stabilization wall clocks with blue loop x5 bilat Standing blue loop wall scoops 2x5 Standing lat pull-down 25# 2x5 Side step green band isometric internal and external rotation 2x5 each bilat Supine serratus punch with 2# dumbbell 2x10 bilat Supine arm tracing with 2# dumbbell in hand of alphabet A-Z bilat Prone rows with 2# dumbbells 2x10 right Prone shoulder extension with 2# dumbbells 2x10 right Prone horizontal abduction with 2# dumbbells 2x10 right Standing rows 10# cable pulley 2x10   PATIENT EDUCATION: Education details: PT eval findings, anticipated POC, initial HEP, and postural awareness   Person educated: Patient Education method:  Explanation, Demonstration, Tactile cues, Verbal cues, and Handouts Education comprehension: verbalized understanding and returned demonstration  HOME EXERCISE PROGRAM: Access Code: [email or web address] URL: [email or web address] Date: 07/31/2024 Prepared by: Burnard  Exercises - Single Arm Shoulder Extension with Resistance  - 1 x daily - 3 x weekly - 1-3 sets - 10 reps - Shoulder External Rotation with Anchored Resistance  - 1 x daily - 3 x weekly - 1-3 sets - 10 reps - Shoulder Internal Rotation with Resistance  - 1 x daily - 3 x weekly - 1-3 sets - 10 reps - Standing Single Arm Shoulder Flexion with Posterior Anchored Resistance (Mirrored)  - 1 x daily - 3 x weekly - 2 sets - 10 reps - Sidelying Open Book Thoracic Rotation with Knee on Foam Roll  - 2 x daily - 7 x weekly - 1 sets - 10 reps - 5 hold - Supine Shoulder Horizontal Abduction with Resistance  - 2 x daily - 7 x weekly - 2 sets - 10 reps - Supine Bilateral Shoulder External Rotation with Resistance  - 2 x daily - 7 x weekly - 2 sets - 10 reps - Supine PNF D2 Flexion with Resistance  - 2 x daily - 7 x weekly - 2 sets - 10 reps  ASSESSMENT:  CLINICAL IMPRESSION:  Mr Capo presents to PT reporting his pain is increased due to overdoing it in  the gym this past week. He states he is still experiencing pain when sleeping on the right arm. MMT was reassessed in which he is showing decreased strength and increased pain with right shoulder and scapular; scapular winging was observed throughout. His PFPS score shows continued difficulties/worsening with functional activities; his score at eval could be due to improper understanding of scale (reversed scale as patient stated that he may have misunderstood the instructions at eval). Patient continues to require education on the importance of keeping minimal strain on his shoulder through gym workouts and yoga, he appears to be in a cycle of feeling better and then over doing it and  experiencing pain again. He is still having pain with daily activities and is limited in his strength and stability.  Able to assess a Quick DASH today and patient with a score of 52.27 and states difficulties with many activities.  Patient additionally noted with decreased right grip strength when assessed today.  Patient would benefit from continued skilled PT of 1x/week for 8 weeks to allow him to progress towards goal related activities.   OBJECTIVE IMPAIRMENTS: decreased activity tolerance, decreased knowledge of condition, decreased strength, hypomobility, increased fascial restrictions, increased muscle spasms, impaired flexibility, impaired UE functional use, postural dysfunction, and pain.   ACTIVITY LIMITATIONS: lifting, sleeping, and weightbearing through shoulder  PARTICIPATION LIMITATIONS: not restricted but painful  PERSONAL FACTORS: 1-2 comorbidities: bipolar depression, acute low back pain are also affecting patient's functional outcome.   REHAB POTENTIAL: Excellent  CLINICAL DECISION MAKING: Evolving/moderate complexity  EVALUATION COMPLEXITY: Low   GOALS: Goals reviewed with patient? Yes  SHORT TERM GOALS: Target date: 08/22/24  Patient able to sleep without pain Baseline:  Goal status: Discontinued and moved to Long Term Goals  2.  Patient will be independent with initial HEP  Baseline: 07/31/24 Goal status: Met on 08/20/24    LONG TERM GOALS: Target date: 11/09/24  Patient to report pain no greater than 2/10 with weightbearing and ADLs Baseline: 6-7/10 (08/02/24) Goal status: In progress (8-9/10 when doing higher level exercises in gym and yoga 09/13/2024)  2.  Patient to be independent with advanced HEP  Baseline:  Goal status: Ongoing  3.  Patient to demonstrate right shoulder/grip strength to [facility] to allow him to perform weight bearing yoga activities without difficulty.  Baseline:  Goal status: In progress (see above)  4.  Average PSFS score to improve  by 2 points showing functional improvement. Baseline: 4 (09/13/24) Goal status: in progress: 4 on 09/13/24  5.  Patient to report ability to sleep on his right side with 70% increased ease.  Baseline: pain with sleep, especially on right side  Goal status:  NEW on 09/13/2024  6.  Patient to improve Quick DASH to no greater than 30 to demonstrate improvements in various functional tasks.  Baseline:  52.27 on 09/12/24  Goal status:  NEW on 09/13/2024   PLAN:  PT FREQUENCY: 1x/week  PT DURATION: 8 weeks  PLANNED INTERVENTIONS: 97164- PT Re-evaluation, 97110-Therapeutic exercises, 97530- Therapeutic activity, 97112- Neuromuscular re-education, 97535- Self Care, 02859- Manual therapy, [phone]- Canalith repositioning, [id number]- Aquatic Therapy, [phone]- Electrical stimulation (unattended), [phone]- Electrical stimulation (manual), [id number]- Vasopneumatic device, [id number]- Ultrasound, [id number]- Traction (mechanical), [id number]- Ionotophoresis 4mg /ml Dexamethasone, 79439 (1-2 muscles), [id number] (3+ muscles)- Dry Needling, Patient/Family education, Taping, Joint mobilization, Joint manipulation, Spinal manipulation, Spinal mobilization, Cryotherapy, and Moist heat  PLAN FOR NEXT SESSION: Rt shoulder and postural strengthening, scapular stabilization and strengthening; RCT strengthening in mid-range; more overhead motions; manual/DN to R  UQ, spinal mobs as needed   Arleene Harsh, SPT 09/13/24, 9:28 AM    I agree with the following treatment note after reviewing documentation. This session was performed under the supervision of a licensed clinician.  Jarrell Laming, PT, DPT 09/13/24, 9:28 AM  [facility] [address] [place], [place] [address] Phone # [phone] Fax [phone]   "

## 2024-09-14 NOTE — Addendum Note (Signed)
 Addended by: MAXINE JARRELL CROME on: 09/14/2024 09:46 AM   Modules accepted: Orders

## 2024-09-17 ENCOUNTER — Ambulatory Visit: Payer: MEDICAID | Admitting: Rehabilitative and Restorative Service Providers"

## 2024-09-24 ENCOUNTER — Ambulatory Visit: Payer: MEDICAID | Admitting: Rehabilitative and Restorative Service Providers"

## 2024-10-01 ENCOUNTER — Ambulatory Visit: Payer: MEDICAID | Admitting: Rehabilitative and Restorative Service Providers"

## 2024-10-08 ENCOUNTER — Ambulatory Visit: Payer: MEDICAID | Admitting: Rehabilitative and Restorative Service Providers"

## 2024-10-15 ENCOUNTER — Ambulatory Visit: Payer: MEDICAID | Admitting: Rehabilitative and Restorative Service Providers"

## 2024-10-22 ENCOUNTER — Ambulatory Visit: Payer: MEDICAID | Admitting: Rehabilitative and Restorative Service Providers"

## 2024-10-29 ENCOUNTER — Ambulatory Visit: Payer: MEDICAID | Admitting: Rehabilitative and Restorative Service Providers"

## 2024-11-08 ENCOUNTER — Ambulatory Visit: Payer: MEDICAID | Admitting: Rehabilitative and Restorative Service Providers"
